# Patient Record
Sex: Male | Born: 1955 | Hispanic: No | Marital: Married | Smoking: Former smoker
Health system: Southern US, Community
[De-identification: ages and names within clinical notes are randomized; demographics above are authoritative.]

## PROBLEM LIST (undated history)

## (undated) DIAGNOSIS — IMO0002 Reserved for concepts with insufficient information to code with codable children: Secondary | ICD-10-CM

## (undated) DIAGNOSIS — I10 Essential (primary) hypertension: Secondary | ICD-10-CM

## (undated) DIAGNOSIS — E119 Type 2 diabetes mellitus without complications: Secondary | ICD-10-CM

## (undated) DIAGNOSIS — K5732 Diverticulitis of large intestine without perforation or abscess without bleeding: Secondary | ICD-10-CM

## (undated) DIAGNOSIS — C4490 Unspecified malignant neoplasm of skin, unspecified: Secondary | ICD-10-CM

## (undated) DIAGNOSIS — D3500 Benign neoplasm of unspecified adrenal gland: Secondary | ICD-10-CM

## (undated) DIAGNOSIS — R1909 Other intra-abdominal and pelvic swelling, mass and lump: Secondary | ICD-10-CM

## (undated) HISTORY — DX: Unspecified malignant neoplasm of skin, unspecified: C44.90

## (undated) HISTORY — PX: OTHER SURGICAL HISTORY: SHX169

---

## 2003-10-20 ENCOUNTER — Ambulatory Visit (HOSPITAL_BASED_OUTPATIENT_CLINIC_OR_DEPARTMENT_OTHER): Admission: RE | Admit: 2003-10-20 | Discharge: 2003-10-20 | Payer: Self-pay | Admitting: Urology

## 2010-12-22 ENCOUNTER — Emergency Department (HOSPITAL_COMMUNITY)
Admission: EM | Admit: 2010-12-22 | Discharge: 2010-12-23 | Disposition: A | Discharge status: Home / Self Care | Payer: 59 | Attending: Emergency Medicine | Admitting: Emergency Medicine

## 2010-12-22 DIAGNOSIS — E119 Type 2 diabetes mellitus without complications: Secondary | ICD-10-CM | POA: Insufficient documentation

## 2010-12-22 DIAGNOSIS — R040 Epistaxis: Secondary | ICD-10-CM | POA: Insufficient documentation

## 2010-12-22 DIAGNOSIS — I10 Essential (primary) hypertension: Secondary | ICD-10-CM | POA: Insufficient documentation

## 2010-12-22 DIAGNOSIS — Z79899 Other long term (current) drug therapy: Secondary | ICD-10-CM | POA: Insufficient documentation

## 2010-12-22 DIAGNOSIS — R110 Nausea: Secondary | ICD-10-CM | POA: Insufficient documentation

## 2010-12-23 LAB — BASIC METABOLIC PANEL
CO2: 28 mEq/L (ref 19–32)
Calcium: 9.7 mg/dL (ref 8.4–10.5)
Creatinine, Ser: 0.9 mg/dL (ref 0.4–1.5)
GFR calc Af Amer: >60 mL/min (ref >60)
GFR calc non Af Amer: >60 mL/min (ref >60)
Sodium: 139 mEq/L (ref 135–145)

## 2010-12-23 LAB — CBC
Hemoglobin: 12.4 g/dL — ABNORMAL LOW (ref 13.0–17.0)
MCH: 30 pg (ref 26.0–34.0)
MCHC: 33.2 g/dL (ref 30.0–36.0)
Platelets: 200 10*3/uL (ref 150–400)
RDW: 13.3 % (ref 11.5–15.5)

## 2010-12-23 LAB — PROTIME-INR
INR: 0.93 (ref 0.00–1.49)
Prothrombin Time: 12.7 seconds (ref 11.6–15.2)

## 2010-12-23 LAB — DIFFERENTIAL
Basophils Absolute: 0 10*3/uL (ref 0.0–0.1)
Basophils Relative: 0 % (ref 0–1)
Eosinophils Absolute: 0.1 10*3/uL (ref 0.0–0.7)
Eosinophils Relative: 1 % (ref 0–5)
Monocytes Absolute: 0.4 10*3/uL (ref 0.1–1.0)
Monocytes Relative: 7 % (ref 3–12)
Neutro Abs: 2.8 10*3/uL (ref 1.7–7.7)

## 2011-10-21 ENCOUNTER — Ambulatory Visit (INDEPENDENT_AMBULATORY_CARE_PROVIDER_SITE_OTHER): Payer: 59 | Admitting: Family Medicine

## 2011-10-21 VITALS — BP 135/85 | HR 101 | Temp 99.0°F | Resp 16 | Ht 71.5 in | Wt 250.2 lb

## 2011-10-21 DIAGNOSIS — Z0000 Encounter for general adult medical examination without abnormal findings: Secondary | ICD-10-CM

## 2011-10-21 DIAGNOSIS — E119 Type 2 diabetes mellitus without complications: Secondary | ICD-10-CM

## 2011-10-21 DIAGNOSIS — I10 Essential (primary) hypertension: Secondary | ICD-10-CM

## 2011-10-21 LAB — GLUCOSE, POCT (MANUAL RESULT ENTRY): POC Glucose: 154

## 2011-10-21 MED ORDER — METFORMIN HCL 1000 MG PO TABS: 1000.0000 mg | ORAL_TABLET | Freq: Two times a day (BID) | ORAL | Status: DC

## 2011-10-21 MED ORDER — LISINOPRIL 20 MG PO TABS: 20.0000 mg | ORAL_TABLET | Freq: Every day | ORAL | Status: DC

## 2011-10-21 NOTE — Progress Notes (Signed)
  Subjective:    Patient ID: Theodore Flores, male    DOB: 1955/08/20, 56 y.o.   MRN: [id number]  HPI 56 yo male here for PE and DOT.  Ran out of medicines about 2 or more months ago and decided not to refill because has made lifestyle changes and lost weight.  H/O diabetes and htn.  No complaints.  DOT expired last month, needs new card.     Review of Systems Negative except as per HPI     Objective:   Physical Exam  Constitutional: Vital signs are normal. He appears well-developed and well-nourished. He is active.  HENT:  Head: Normocephalic.  Right Ear: Hearing, tympanic membrane, external ear and ear canal normal.  Left Ear: Hearing, tympanic membrane, external ear and ear canal normal.  Nose: Nose normal.  Mouth/Throat: Uvula is midline, oropharynx is clear and moist and mucous membranes are normal.  Eyes: Conjunctivae and EOM are normal. Pupils are equal, round, and reactive to light.  Neck: No mass and no thyromegaly present.  Cardiovascular: Normal rate, regular rhythm, normal heart sounds, intact distal pulses and normal pulses.   Pulmonary/Chest: Effort normal and breath sounds normal.  Abdominal: Soft. Normal appearance and bowel sounds are normal. There is no hepatosplenomegaly. There is no tenderness. There is no CVA tenderness. No hernia. Hernia confirmed negative in the right inguinal area and confirmed negative in the left inguinal area.  Genitourinary: Testes normal and penis normal.  Lymphadenopathy:    He has no cervical adenopathy.    He has no axillary adenopathy.  Neurological: He is alert.    Results for orders placed in visit on 10/21/11  GLUCOSE, POCT (MANUAL RESULT ENTRY)      Component Value Range   POC Glucose 154    POCT GLYCOSYLATED HEMOGLOBIN (HGB A1C)      Component Value Range   Hemoglobin A1C 8.0           Assessment & Plan:  PE, DOT - labs pending.  Good for 1 year. HTN - continue lisinopril DM - continue metformin.

## 2011-10-22 LAB — COMPREHENSIVE METABOLIC PANEL
ALT: 22 U/L (ref 0–53)
Albumin: 4.2 g/dL (ref 3.5–5.2)
Alkaline Phosphatase: 64 U/L (ref 39–117)
CO2: 22 mEq/L (ref 19–32)
Potassium: 3.8 mEq/L (ref 3.5–5.3)
Sodium: 141 mEq/L (ref 135–145)
Total Bilirubin: 0.3 mg/dL (ref 0.3–1.2)
Total Protein: 7 g/dL (ref 6.0–8.3)

## 2011-10-22 LAB — CBC WITH DIFFERENTIAL/PLATELET
Eosinophils Absolute: 0 10*3/uL (ref 0.0–0.7)
Hemoglobin: 13.8 g/dL (ref 13.0–17.0)
Lymphs Abs: 2.7 10*3/uL (ref 0.7–4.0)
MCH: 29.8 pg (ref 26.0–34.0)
Monocytes Relative: 4 % (ref 3–12)
Neutro Abs: 2.8 10*3/uL (ref 1.7–7.7)
Neutrophils Relative %: 48 % (ref 43–77)
Platelets: 246 10*3/uL (ref 150–400)
RBC: 4.63 MIL/uL (ref 4.22–5.81)
WBC: 5.7 10*3/uL (ref 4.0–10.5)

## 2012-07-16 ENCOUNTER — Inpatient Hospital Stay (HOSPITAL_COMMUNITY)
Admission: EM | Admit: 2012-07-16 | Discharge: 2012-07-20 | DRG: 730 | Disposition: A | Discharge status: Home / Self Care | Payer: 59 | Attending: Family Medicine | Admitting: Family Medicine

## 2012-07-16 ENCOUNTER — Encounter (HOSPITAL_COMMUNITY): Payer: Self-pay | Admitting: *Deleted

## 2012-07-16 ENCOUNTER — Emergency Department (HOSPITAL_COMMUNITY)
Admission: EM | Admit: 2012-07-16 | Discharge: 2012-07-16 | Disposition: A | Discharge status: Nursing Home (Includes ICF) | Payer: 59 | Source: Home / Self Care | Attending: Family Medicine | Admitting: Family Medicine

## 2012-07-16 ENCOUNTER — Emergency Department (HOSPITAL_COMMUNITY): Payer: 59

## 2012-07-16 DIAGNOSIS — I10 Essential (primary) hypertension: Secondary | ICD-10-CM | POA: Diagnosis present

## 2012-07-16 DIAGNOSIS — R1909 Other intra-abdominal and pelvic swelling, mass and lump: Secondary | ICD-10-CM | POA: Diagnosis present

## 2012-07-16 DIAGNOSIS — Z823 Family history of stroke: Secondary | ICD-10-CM

## 2012-07-16 DIAGNOSIS — N492 Inflammatory disorders of scrotum: Secondary | ICD-10-CM

## 2012-07-16 DIAGNOSIS — Z8249 Family history of ischemic heart disease and other diseases of the circulatory system: Secondary | ICD-10-CM

## 2012-07-16 DIAGNOSIS — F17200 Nicotine dependence, unspecified, uncomplicated: Secondary | ICD-10-CM | POA: Diagnosis present

## 2012-07-16 DIAGNOSIS — R1900 Intra-abdominal and pelvic swelling, mass and lump, unspecified site: Secondary | ICD-10-CM

## 2012-07-16 DIAGNOSIS — N508 Other specified disorders of male genital organs: Principal | ICD-10-CM | POA: Diagnosis present

## 2012-07-16 DIAGNOSIS — F1010 Alcohol abuse, uncomplicated: Secondary | ICD-10-CM | POA: Diagnosis present

## 2012-07-16 DIAGNOSIS — N498 Inflammatory disorders of other specified male genital organs: Secondary | ICD-10-CM

## 2012-07-16 DIAGNOSIS — N5089 Other specified disorders of the male genital organs: Secondary | ICD-10-CM

## 2012-07-16 DIAGNOSIS — E1169 Type 2 diabetes mellitus with other specified complication: Secondary | ICD-10-CM | POA: Diagnosis present

## 2012-07-16 DIAGNOSIS — E119 Type 2 diabetes mellitus without complications: Secondary | ICD-10-CM | POA: Diagnosis present

## 2012-07-16 DIAGNOSIS — Z809 Family history of malignant neoplasm, unspecified: Secondary | ICD-10-CM

## 2012-07-16 DIAGNOSIS — E669 Obesity, unspecified: Secondary | ICD-10-CM | POA: Diagnosis present

## 2012-07-16 HISTORY — DX: Essential (primary) hypertension: I10

## 2012-07-16 HISTORY — DX: Type 2 diabetes mellitus without complications: E11.9

## 2012-07-16 LAB — URINE MICROSCOPIC-ADD ON

## 2012-07-16 LAB — CBC WITH DIFFERENTIAL/PLATELET
Basophils Absolute: 0 10*3/uL (ref 0.0–0.1)
Basophils Relative: 0 % (ref 0–1)
MCHC: 33.9 g/dL (ref 30.0–36.0)
Monocytes Absolute: 0.4 10*3/uL (ref 0.1–1.0)
Neutro Abs: 3.2 10*3/uL (ref 1.7–7.7)
Neutrophils Relative %: 51 % (ref 43–77)
Platelets: 240 10*3/uL (ref 150–400)
RDW: 12.7 % (ref 11.5–15.5)

## 2012-07-16 LAB — URINALYSIS, ROUTINE W REFLEX MICROSCOPIC
Ketones, ur: NEGATIVE mg/dL
Leukocytes, UA: NEGATIVE
Nitrite: NEGATIVE
Protein, ur: NEGATIVE mg/dL
Urobilinogen, UA: 0.2 mg/dL (ref 0.0–1.0)

## 2012-07-16 LAB — COMPREHENSIVE METABOLIC PANEL
ALT: 18 U/L (ref 0–53)
AST: 16 U/L (ref 0–37)
Albumin: 3.3 g/dL — ABNORMAL LOW (ref 3.5–5.2)
Chloride: 103 mEq/L (ref 96–112)
Creatinine, Ser: 0.91 mg/dL (ref 0.50–1.35)
Potassium: 3.8 mEq/L (ref 3.5–5.1)
Sodium: 137 mEq/L (ref 135–145)
Total Bilirubin: 0.2 mg/dL — ABNORMAL LOW (ref 0.3–1.2)

## 2012-07-16 MED ORDER — MORPHINE SULFATE 4 MG/ML IJ SOLN
4.0000 mg | Freq: Once | INTRAMUSCULAR | Status: AC
  Administered 2012-07-16: 4 mg via INTRAVENOUS
  Filled 2012-07-16: qty 1

## 2012-07-16 MED ORDER — PIPERACILLIN-TAZOBACTAM 3.375 G IVPB 30 MIN
3.3750 g | Freq: Once | INTRAVENOUS | Status: AC
  Administered 2012-07-16: 3.375 g via INTRAVENOUS
  Filled 2012-07-16: qty 50

## 2012-07-16 MED ORDER — IOHEXOL 300 MG/ML  SOLN
100.0000 mL | Freq: Once | INTRAMUSCULAR | Status: AC | PRN
  Administered 2012-07-16: 100 mL via INTRAVENOUS

## 2012-07-16 MED ORDER — ONDANSETRON HCL 4 MG/2ML IJ SOLN
4.0000 mg | Freq: Once | INTRAMUSCULAR | Status: AC
  Administered 2012-07-16: 4 mg via INTRAVENOUS
  Filled 2012-07-16: qty 2

## 2012-07-16 MED ORDER — VANCOMYCIN HCL IN DEXTROSE 1-5 GM/200ML-% IV SOLN
1000.0000 mg | Freq: Once | INTRAVENOUS | Status: AC
  Administered 2012-07-17: 1000 mg via INTRAVENOUS
  Filled 2012-07-16: qty 200

## 2012-07-16 NOTE — ED Notes (Signed)
Pt  Has  A  Large   Draining  Necrotic  Area  To  r  Side  Of  His  Scrotum  Which   He  Reports  Started  Off  As  A  Wart  Which  Was  Removed   3  Years  Ago         He  Reports  The  Area        Has  Gotten  Worse  Over the  Last  Month         It is  Foul  Smelling  And  Purulent         He  Is  A  Diabetic        Who  Takes  Metformin

## 2012-07-16 NOTE — ED Notes (Signed)
The pt  Has had pain and drainage on his scrotum for one month with drainage also.  He had a wart removed 2-3 years ago.

## 2012-07-16 NOTE — ED Provider Notes (Addendum)
History     CSN: [id number]  Arrival date & time 07/16/12  1709   First MD Initiated Contact with Patient 07/16/12 2027      Chief Complaint  Patient presents with  . scrotum pain     (Consider location/radiation/quality/duration/timing/severity/associated sxs/prior treatment) HPI Comments: 56 year old man with a history of diabetes and hypertension presents emergency department with chief complaint of scrotal pain.  Onset of symptoms began about 2-3 months ago but has been gradually worsening over the last month.  Patient reports he had a wart removed in the past and that since the removal the area has worsened.  Now there is a foul smell and purulent drainage.  Pain severity 10/10 without radiation.  Patient denies fever, night sweats, chills, abdominal pain, nausea, vomiting.  The history is provided by the patient.    Past Medical History  Diagnosis Date  . Diabetes mellitus without complication   . Hypertension     History reviewed. No pertinent past surgical history.  No family history on file.  History  Substance Use Topics  . Smoking status: Current Every Day Smoker    Types: Cigarettes  . Smokeless tobacco: Not on file  . Alcohol Use: Yes      Review of Systems  Constitutional: Negative for fever, chills and appetite change.  HENT: Negative for congestion.   Eyes: Negative for visual disturbance.  Respiratory: Negative for shortness of breath.   Cardiovascular: Negative for chest pain and leg swelling.  Gastrointestinal: Negative for abdominal pain.  Genitourinary: Positive for genital sores. Negative for dysuria, urgency and frequency.  Neurological: Negative for dizziness, syncope, weakness, light-headedness, numbness and headaches.  Psychiatric/Behavioral: Negative for confusion.  All other systems reviewed and are negative.    Allergies  Review of patient's allergies indicates no known allergies.  Home Medications   Current Outpatient Rx  Name   Route  Sig  Dispense  Refill  . LISINOPRIL 20 MG PO TABS   Oral   Take 1 tablet (20 mg total) by mouth daily.   30 tablet   11   . METFORMIN HCL 1000 MG PO TABS   Oral   Take 1 tablet (1,000 mg total) by mouth 2 (two) times daily with a meal.   60 tablet   11     BP 151/90  Pulse 100  Temp 98.1 F (36.7 C) (Oral)  Resp 18  SpO2 98%  Physical Exam  Nursing note and vitals reviewed. Constitutional: He is oriented to person, place, and time. He appears well-developed and well-nourished. No distress.  HENT:  Head: Normocephalic and atraumatic.  Eyes: Conjunctivae normal and EOM are normal.  Neck: Normal range of motion.  Pulmonary/Chest: Effort normal.  Genitourinary:       Exam chaperoned. Large 5 cm ulcerated lesion on right lateral testicle. Foul smelling. Green/Yellow purulent drainage.   Musculoskeletal: Normal range of motion.  Neurological: He is alert and oriented to person, place, and time.  Skin: Skin is warm and dry. No rash noted. He is not diaphoretic.  Psychiatric: He has a normal mood and affect. His behavior is normal.    ED Course  Procedures (including critical care time)  Labs Reviewed  COMPREHENSIVE METABOLIC PANEL - Abnormal; Notable for the following:    Glucose, Bld 226 (*)     Albumin 3.3 (*)     Total Bilirubin 0.2 (*)     All other components within normal limits  CBC WITH DIFFERENTIAL  WOUND CULTURE  URINALYSIS, ROUTINE  W REFLEX MICROSCOPIC  URINE CULTURE   Ct Pelvis W Contrast  07/16/2012  *RADIOLOGY REPORT*  Clinical Data:  Large draining necrotic area at the right side of the scrotum.  History of diabetes.  CT PELVIS WITH CONTRAST  Technique:  Multidetector CT imaging of the pelvis was performed using the standard protocol following the bolus administration of intravenous contrast.  Contrast: OMNIPAQUE IOHEXOL 300 MG/ML  SOLN  Comparison:   None.  Findings:  There is soft tissue disruption along the anterior aspect of the right  scrotum, with associated mild bilateral scrotal wall edema, mildly more prominent on the right.  The testes are otherwise grossly unremarkable.  No significant hydroceles are seen.  No free air is seen to suggest Fournier's gangrene.  A small right inguinal hernia is noted, containing only fat. Scattered inguinal nodes remain normal in size.  No definite pelvic sidewall lymphadenopathy is seen.  Scattered calcifications are seen adjacent to the right-sided spermatic cord.  The bladder is decompressed and grossly unremarkable.  The prostate remains normal in size, with scattered calcification.  Visualized small large bowel loops are grossly unremarkable.  Mild scattered calcification is noted along the abdominal aorta and its branches.  No acute osseous abnormalities are identified.  IMPRESSION:  1.  Soft tissue disruption along the anterior aspect of the right scrotum, with associated mild bilateral scrotal wall edema, mildly more prominent on the right.  No significant hydrocele seen; no free air seen to suggest Fournier's gangrene.  No evidence of focal abscess. 2.  Small right inguinal hernia, containing only fat. 3.  Mild scattered vascular calcifications noted.   Original Report Authenticated By: Tonia Ghent, M.D.      1. Ulcer of testis       MDM  Ulceration/ wound of testicle  Pt is a 56 yo dm with ulceration of right lateral testicle. Wound culture sent, pain managed in ER, & IV abx started. Pt to be admitted for further evaluation. Pt seen w Dr. Ignacia Palma who is agreeable w plan. The patient appears reasonably stabilized for admission considering the current resources, flow, and capabilities available in the ED at this time, and I doubt any other [facility] requiring further screening and/or treatment in the ED prior to admission.         Jaci Carrel, PA-C 07/17/12 0053  Jaci Carrel, PA-C 08/05/12 1530

## 2012-07-16 NOTE — Progress Notes (Signed)
56 yo man with 3+ month Hx of a nonhealing lesion on the right scrotal skin, that has become much larger over the pase two weeks, and no has a foul-smelling discharge.  Exam reveals a 3 x 3 cm ulcerated area on the right hemiscrotum, with a foul-smelling discharge.  Recommend lab workup, IV antibiotics, and admission.  May need skin biopsy to check for malignancy.

## 2012-07-16 NOTE — ED Notes (Signed)
Pt to xray

## 2012-07-16 NOTE — ED Notes (Signed)
Iv placed. The pts wound has a very foul odor

## 2012-07-16 NOTE — ED Provider Notes (Signed)
History     CSN: [id number]  Arrival date & time 07/16/12  1336   First MD Initiated Contact with Patient 07/16/12 1630      Chief Complaint  Patient presents with  . Abscess    (Consider location/radiation/quality/duration/timing/severity/associated sxs/prior treatment) Patient is a 56 y.o. male presenting with abscess. The history is provided by the patient.  Abscess  This is a chronic problem. Episode onset: for 3 mos, but worse past 1 month. The problem has been gradually worsening. The abscess is present on the genitalia. The problem is moderate. The abscess is characterized by draining, redness and swelling.    No past medical history on file.  No past surgical history on file.  No family history on file.  History  Substance Use Topics  . Smoking status: Current Every Day Smoker    Types: Cigarettes  . Smokeless tobacco: Not on file  . Alcohol Use: Not on file      Review of Systems  Constitutional: Negative.   Genitourinary: Positive for scrotal swelling. Negative for testicular pain.    Allergies  Review of patient's allergies indicates no known allergies.  Home Medications   Current Outpatient Rx  Name  Route  Sig  Dispense  Refill  . LISINOPRIL 20 MG PO TABS   Oral   Take 1 tablet (20 mg total) by mouth daily.   30 tablet   11   . METFORMIN HCL 1000 MG PO TABS   Oral   Take 1 tablet (1,000 mg total) by mouth 2 (two) times daily with a meal.   60 tablet   11   . VITAMIN D (ERGOCALCIFEROL) 50000 UNITS PO CAPS   Oral   Take 50,000 Units by mouth once a week.           BP 154/81  Pulse 107  Temp 98.1 F (36.7 C) (Oral)  Resp 18  SpO2 98%  Physical Exam  Nursing note and vitals reviewed. Constitutional: He is oriented to person, place, and time. He appears well-developed and well-nourished.  Abdominal: Bowel sounds are normal.  Genitourinary:       Warty lesion approx 5cm with purulent foul smelling drainage assoc. On right side of  scrotum,  Neurological: He is alert and oriented to person, place, and time.    ED Course  Procedures (including critical care time)  Labs Reviewed - No data to display No results found.   1. Scrotal infection       MDM          Linna Hoff, MD 07/19/12 [phone]

## 2012-07-16 NOTE — ED Notes (Signed)
The pt is alert.  i triaged this pt earlier with a scrotal lesion that keeps draining for one month .  The pt is a diabetic

## 2012-07-16 NOTE — ED Notes (Signed)
Admitting doctors  At the bedside

## 2012-07-17 ENCOUNTER — Inpatient Hospital Stay (HOSPITAL_COMMUNITY): Payer: 59

## 2012-07-17 LAB — GLUCOSE, CAPILLARY
Glucose-Capillary: 228 mg/dL — ABNORMAL HIGH (ref 70–99)
Glucose-Capillary: 127 mg/dL — ABNORMAL HIGH (ref 70–99)
Glucose-Capillary: 176 mg/dL — ABNORMAL HIGH (ref 70–99)
Glucose-Capillary: 169 mg/dL — ABNORMAL HIGH (ref 70–99)

## 2012-07-17 LAB — CBC
MCHC: 32.9 g/dL (ref 30.0–36.0)
RDW: 12.8 % (ref 11.5–15.5)

## 2012-07-17 LAB — CREATININE, SERUM
Creatinine, Ser: 0.97 mg/dL (ref 0.50–1.35)
GFR calc non Af Amer: 90 mL/min — ABNORMAL LOW (ref >90)

## 2012-07-17 MED ORDER — LORAZEPAM 1 MG PO TABS: 1.0000 mg | ORAL_TABLET | Freq: Four times a day (QID) | ORAL | Status: AC | PRN

## 2012-07-17 MED ORDER — SODIUM CHLORIDE 0.9 % IJ SOLN: 3.0000 mL | INTRAMUSCULAR | Status: DC | PRN

## 2012-07-17 MED ORDER — FOLIC ACID 1 MG PO TABS
1.0000 mg | ORAL_TABLET | Freq: Every day | ORAL | Status: DC
  Administered 2012-07-17 – 2012-07-20 (×4): 1 mg via ORAL
  Filled 2012-07-17 (×4): qty 1

## 2012-07-17 MED ORDER — INSULIN ASPART 100 UNIT/ML ~~LOC~~ SOLN
0.0000 [IU] | Freq: Three times a day (TID) | SUBCUTANEOUS | Status: DC
  Administered 2012-07-17: 2 [IU] via SUBCUTANEOUS
  Administered 2012-07-17: 1 [IU] via SUBCUTANEOUS
  Administered 2012-07-17: 2 [IU] via SUBCUTANEOUS
  Administered 2012-07-18: 18:00:00 via SUBCUTANEOUS
  Administered 2012-07-18: 2 [IU] via SUBCUTANEOUS
  Administered 2012-07-19 – 2012-07-20 (×4): 1 [IU] via SUBCUTANEOUS

## 2012-07-17 MED ORDER — HEPARIN SODIUM (PORCINE) 5000 UNIT/ML IJ SOLN
5000.0000 [IU] | Freq: Three times a day (TID) | INTRAMUSCULAR | Status: DC
  Administered 2012-07-17 – 2012-07-20 (×8): 5000 [IU] via SUBCUTANEOUS
  Filled 2012-07-17 (×12): qty 1

## 2012-07-17 MED ORDER — LORAZEPAM 2 MG/ML IJ SOLN
1.0000 mg | Freq: Four times a day (QID) | INTRAMUSCULAR | Status: AC | PRN
  Administered 2012-07-18: 1 mg via INTRAVENOUS
  Filled 2012-07-17: qty 1

## 2012-07-17 MED ORDER — VANCOMYCIN HCL IN DEXTROSE 1-5 GM/200ML-% IV SOLN
1000.0000 mg | Freq: Three times a day (TID) | INTRAVENOUS | Status: DC
  Administered 2012-07-17 – 2012-07-18 (×4): 1000 mg via INTRAVENOUS
  Filled 2012-07-17 (×6): qty 200

## 2012-07-17 MED ORDER — THIAMINE HCL 100 MG/ML IJ SOLN
100.0000 mg | Freq: Every day | INTRAMUSCULAR | Status: DC
  Filled 2012-07-17 (×4): qty 1

## 2012-07-17 MED ORDER — VITAMIN B-1 100 MG PO TABS
100.0000 mg | ORAL_TABLET | Freq: Every day | ORAL | Status: DC
  Administered 2012-07-17 – 2012-07-20 (×4): 100 mg via ORAL
  Filled 2012-07-17 (×4): qty 1

## 2012-07-17 MED ORDER — MORPHINE SULFATE 2 MG/ML IJ SOLN
2.0000 mg | INTRAMUSCULAR | Status: DC | PRN
  Administered 2012-07-18 – 2012-07-19 (×6): 2 mg via INTRAVENOUS
  Filled 2012-07-17 (×6): qty 1

## 2012-07-17 MED ORDER — PIPERACILLIN-TAZOBACTAM 3.375 G IVPB
3.3750 g | Freq: Three times a day (TID) | INTRAVENOUS | Status: DC
  Administered 2012-07-17 – 2012-07-19 (×7): 3.375 g via INTRAVENOUS
  Filled 2012-07-17 (×10): qty 50

## 2012-07-17 MED ORDER — LISINOPRIL 20 MG PO TABS
20.0000 mg | ORAL_TABLET | Freq: Every day | ORAL | Status: DC
  Administered 2012-07-17 – 2012-07-20 (×4): 20 mg via ORAL
  Filled 2012-07-17 (×4): qty 1

## 2012-07-17 MED ORDER — ADULT MULTIVITAMIN W/MINERALS CH
1.0000 | ORAL_TABLET | Freq: Every day | ORAL | Status: DC
  Administered 2012-07-17 – 2012-07-20 (×4): 1 via ORAL
  Filled 2012-07-17 (×4): qty 1

## 2012-07-17 MED ORDER — ACETAMINOPHEN 650 MG RE SUPP: 650.0000 mg | Freq: Four times a day (QID) | RECTAL | Status: DC | PRN

## 2012-07-17 MED ORDER — ACETAMINOPHEN 325 MG PO TABS
650.0000 mg | ORAL_TABLET | Freq: Four times a day (QID) | ORAL | Status: DC | PRN
  Administered 2012-07-18 – 2012-07-19 (×2): 650 mg via ORAL
  Filled 2012-07-17 (×2): qty 2

## 2012-07-17 MED ORDER — SODIUM CHLORIDE 0.9 % IV SOLN: 250.0000 mL | INTRAVENOUS | Status: DC | PRN

## 2012-07-17 MED ORDER — SODIUM CHLORIDE 0.9 % IJ SOLN
3.0000 mL | Freq: Two times a day (BID) | INTRAMUSCULAR | Status: DC
  Administered 2012-07-17 – 2012-07-19 (×3): 3 mL via INTRAVENOUS
  Administered 2012-07-19: 10 mL via INTRAVENOUS
  Administered 2012-07-20: 3 mL via INTRAVENOUS

## 2012-07-17 NOTE — Consult Note (Signed)
Reason for Consult:Rt Groin Mass Referring Physician: Doralee Albino MD  Theodore Flores is an 56 y.o. male.  HPI:   1 - Rt Groin Mass- pt with h/o "wart" removed from rt groin several years ago that recurred and has steadily been enlarging for at least 3 years. 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  Pt denies HIV, STD. Denies hematuria, flank pain.  No prior Urologic evaluation.     Past Medical History  Diagnosis Date  . Diabetes mellitus without complication   . Hypertension     Past Surgical History  Procedure Date  . Wart removal     Scrotal wart removal 10+ years ago    Family History  Problem Relation Age of Onset  . Diabetes Mellitus II    . CVA    . Hypertension      Social History:  reports that he has been smoking Cigarettes.  He has a 20 pack-year smoking history. He does not have any smokeless tobacco history on file. He reports that he drinks about 5 ounces of alcohol per week. He reports that he does not use illicit drugs.  Allergies: No Known Allergies  Medications: I have reviewed the patient's current medications.  Results for orders placed during the hospital encounter of 07/16/12 (from the past 48 hour(s))  CBC WITH DIFFERENTIAL     Status: Normal   Collection Time   07/16/12  9:58 PM      Component Value Range Comment   WBC 6.3  4.0 - 10.5 K/uL    RBC 4.46  4.22 - 5.81 MIL/uL    Hemoglobin 13.4  13.0 - 17.0 g/dL    HCT 16.1  09.6 - 04.5 %    MCV 88.6  78.0 - 100.0 fL    MCH 30.0  26.0 - 34.0 pg    MCHC 33.9  30.0 - 36.0 g/dL    RDW 40.9  81.1 - 91.4 %    Platelets 240  150 - 400 K/uL    Neutrophils Relative 51  43 - 77 %    Neutro Abs 3.2  1.7 - 7.7 K/uL    Lymphocytes Relative 41  12 - 46 %    Lymphs Abs  2.6  0.7 - 4.0 K/uL    Monocytes Relative 7  3 - 12 %    Monocytes Absolute 0.4  0.1 - 1.0 K/uL    Eosinophils Relative 1  0 - 5 %    Eosinophils Absolute 0.1  0.0 - 0.7 K/uL    Basophils Relative 0  0 - 1 %    Basophils Absolute 0.0  0.0 - 0.1 K/uL   COMPREHENSIVE METABOLIC PANEL     Status: Abnormal   Collection Time   07/16/12  9:58 PM      Component Value Range Comment   Sodium 137  135 - 145 mEq/L    Potassium 3.8  3.5 - 5.1 mEq/L    Chloride 103  96 - 112 mEq/L    CO2 24  19 - 32 mEq/L    Glucose, Bld 226 (*) 70 - 99 mg/dL    BUN 12  6 - 23 mg/dL    Creatinine, Ser 7.82  0.50 - 1.35 mg/dL  Calcium 9.2  8.4 - 10.5 mg/dL    Total Protein 6.8  6.0 - 8.3 g/dL    Albumin 3.3 (*) 3.5 - 5.2 g/dL    AST 16  0 - 37 U/L    ALT 18  0 - 53 U/L    Alkaline Phosphatase 85  39 - 117 U/L    Total Bilirubin 0.2 (*) 0.3 - 1.2 mg/dL    GFR calc non Af Amer >90  >90 mL/min    GFR calc Af Amer >90  >90 mL/min   URINALYSIS, ROUTINE W REFLEX MICROSCOPIC     Status: Abnormal   Collection Time   07/16/12 10:17 PM      Component Value Range Comment   Color, Urine YELLOW  YELLOW    APPearance CLOUDY (*) CLEAR    Specific Gravity, Urine 1.030  1.005 - 1.030    pH 5.0  5.0 - 8.0    Glucose, UA >1000 (*) NEGATIVE mg/dL    Hgb urine dipstick TRACE (*) NEGATIVE    Bilirubin Urine NEGATIVE  NEGATIVE    Ketones, ur NEGATIVE  NEGATIVE mg/dL    Protein, ur NEGATIVE  NEGATIVE mg/dL    Urobilinogen, UA 0.2  0.0 - 1.0 mg/dL    Nitrite NEGATIVE  NEGATIVE    Leukocytes, UA NEGATIVE  NEGATIVE   URINE MICROSCOPIC-ADD ON     Status: Abnormal   Collection Time   07/16/12 10:17 PM      Component Value Range Comment   Squamous Epithelial / LPF RARE  RARE    WBC, UA 0-2  <3 WBC/hpf    Bacteria, UA RARE  RARE    Crystals CA OXALATE CRYSTALS (*) NEGATIVE   WOUND CULTURE     Status: Normal (Preliminary result)   Collection Time   07/17/12 12:00 AM      Component Value Range Comment   Specimen Description  WOUND SCROTUM      Special Requests Normal      Gram Stain        Value: NO WBC SEEN     NO SQUAMOUS EPITHELIAL CELLS SEEN     ABUNDANT GRAM NEGATIVE RODS     ABUNDANT GRAM POSITIVE COCCI IN PAIRS   Culture PENDING      Report Status PENDING     GLUCOSE, CAPILLARY     Status: Abnormal   Collection Time   07/17/12  2:41 AM      Component Value Range Comment   Glucose-Capillary 228 (*) 70 - 99 mg/dL    Comment 1 Notify RN     CBC     Status: Abnormal   Collection Time   07/17/12  5:15 AM      Component Value Range Comment   WBC 5.9  4.0 - 10.5 K/uL    RBC 4.31  4.22 - 5.81 MIL/uL    Hemoglobin 12.7 (*) 13.0 - 17.0 g/dL    HCT 16.1 (*) 09.6 - 52.0 %    MCV 89.6  78.0 - 100.0 fL    MCH 29.5  26.0 - 34.0 pg    MCHC 32.9  30.0 - 36.0 g/dL    RDW 04.5  40.9 - 81.1 %    Platelets 216  150 - 400 K/uL   CREATININE, SERUM     Status: Abnormal   Collection Time   07/17/12  5:15 AM      Component Value Range Comment   Creatinine, Ser 0.97  0.50 - 1.35 mg/dL  GFR calc non Af Amer 90 (*) >90 mL/min    GFR calc Af Amer >90  >90 mL/min   GLUCOSE, CAPILLARY     Status: Abnormal   Collection Time   07/17/12  7:38 AM      Component Value Range Comment   Glucose-Capillary 161 (*) 70 - 99 mg/dL   GLUCOSE, CAPILLARY     Status: Abnormal   Collection Time   07/17/12 11:37 AM      Component Value Range Comment   Glucose-Capillary 127 (*) 70 - 99 mg/dL     Ct Pelvis W Contrast  07/16/2012  *RADIOLOGY REPORT*  Clinical Data:  Large draining necrotic area at the right side of the scrotum.  History of diabetes.  CT PELVIS WITH CONTRAST  Technique:  Multidetector CT imaging of the pelvis was performed using the standard protocol following the bolus administration of intravenous contrast.  Contrast: OMNIPAQUE IOHEXOL 300 MG/ML  SOLN  Comparison:   None.  Findings:  There is soft tissue disruption along the anterior aspect of the right scrotum, with associated mild bilateral scrotal wall  edema, mildly more prominent on the right.  The testes are otherwise grossly unremarkable.  No significant hydroceles are seen.  No free air is seen to suggest Fournier's gangrene.  A small right inguinal hernia is noted, containing only fat. Scattered inguinal nodes remain normal in size.  No definite pelvic sidewall lymphadenopathy is seen.  Scattered calcifications are seen adjacent to the right-sided spermatic cord.  The bladder is decompressed and grossly unremarkable.  The prostate remains normal in size, with scattered calcification.  Visualized small large bowel loops are grossly unremarkable.  Mild scattered calcification is noted along the abdominal aorta and its branches.  No acute osseous abnormalities are identified.  IMPRESSION:  1.  Soft tissue disruption along the anterior aspect of the right scrotum, with associated mild bilateral scrotal wall edema, mildly more prominent on the right.  No significant hydrocele seen; no free air seen to suggest Fournier's gangrene.  No evidence of focal abscess. 2.  Small right inguinal hernia, containing only fat. 3.  Mild scattered vascular calcifications noted.   Original Report Authenticated By: Tonia Ghent, M.D.     Review of Systems  Constitutional: Negative.  Negative for fever, chills and weight loss.  HENT: Negative.   Eyes: Negative.   Respiratory: Negative.   Cardiovascular: Negative.   Gastrointestinal: Negative.   Genitourinary: Negative.  Negative for dysuria, urgency, hematuria and flank pain.  Musculoskeletal: Negative.   Skin: Negative.        Skil lesion in Rt groin becoming more painful  Neurological: Negative.   Endo/Heme/Allergies: Negative.   Psychiatric/Behavioral: Negative.    Blood pressure 160/96, pulse 76, temperature 98.6 F (37 C), temperature source Oral, resp. rate 19, height 6' (1.829 m), weight 109.317 kg (241 lb), SpO2 99.00%. Physical Exam  Constitutional: He is oriented to person, place, and time. He appears  well-developed and well-nourished.  HENT:  Head: Normocephalic and atraumatic.  Eyes: EOM are normal. Pupils are equal, round, and reactive to light.  Neck: Normal range of motion. Neck supple.  Cardiovascular: Normal rate and regular rhythm.   Respiratory: Effort normal and breath sounds normal.  GI: Soft. Bowel sounds are normal.  Genitourinary: Prostate normal and penis normal.       Rt groin mass approx 5cm diameter with rolled edges and ulcerated base. No palpable involvment with penis or testicle. No palpable groin adenopathy. No expressible pus or  fluctuence.   Musculoskeletal: Normal range of motion.  Neurological: He is alert and oriented to person, place, and time.  Skin: Skin is warm and dry.  Psychiatric: He has a normal mood and affect. His behavior is normal. Judgment and thought content normal.    Assessment/Plan: 1 - Rt Groin Mass-  Exam and history highly concerning for neoplasm. Explained to pt need for local excision as first step in diagnostics and theraputics. Informed he may need additional therapy such as more surgery, inguinal lymph node dissection or even chemotherapy depending on grade/stage.  Alternative DDX such as severe skin infection or condyloma also discussed.  Will plan for local excision with possible scrotal exploration tomorrow AM. CXR prior to complete staging. CMP w/o elevated alk-phos. Risks including bleeding, infection, damage to adjacent structures (testicle, penis, groin vasculature and lymphatics) discussed as well as rare risks such as DVT, PE, CVA, MI, Mortalitity.  Surgery to be performed at 7:45AM at [facility], will need [facility] transport to and from.   Hanan Moen 07/17/2012, 12:28 PM

## 2012-07-17 NOTE — Progress Notes (Signed)
ANTIBIOTIC CONSULT NOTE - INITIAL  Pharmacy Consult for Vancomycin and Zosyn  Indication: possible scrotal infection  No Known Allergies  Patient Measurements: Height: 6' (182.9 cm) Weight: 241 lb (109.317 kg) IBW/kg (Calculated) : 77.6  Adjusted Body Weight: 90 kg  Vital Signs: Temp: 97.4 F (36.3 C) (11/30 0230) Temp src: Oral (11/30 0230) BP: 167/96 mmHg (11/30 0230) Pulse Rate: 84  (11/30 0230) Intake/Output from previous day:   Intake/Output from this shift:    Labs:  [facility] 07/16/12 2158  WBC 6.3  HGB 13.4  PLT 240  LABCREA --  CREATININE 0.91   Estimated Creatinine Clearance: 114.4 ml/min (by C-G formula based on Cr of 0.91). No results found for this basename: VANCOTROUGH:2,VANCOPEAK:2,VANCORANDOM:2,GENTTROUGH:2,GENTPEAK:2,GENTRANDOM:2,TOBRATROUGH:2,TOBRAPEAK:2,TOBRARND:2,AMIKACINPEAK:2,AMIKACINTROU:2,AMIKACIN:2, in the last 72 hours   Microbiology: No results found for this or any previous visit (from the past 720 hour(s)).  Medical History: Past Medical History  Diagnosis Date  . Diabetes mellitus without complication   . Hypertension     Medications:  Prescriptions prior to admission  Medication Sig Dispense Refill  . lisinopril (PRINIVIL,ZESTRIL) 20 MG tablet Take 1 tablet (20 mg total) by mouth daily.  30 tablet  11  . metFORMIN (GLUCOPHAGE) 1000 MG tablet Take 1 tablet (1,000 mg total) by mouth 2 (two) times daily with a meal.  60 tablet  11   Assessment: 56 yo male with possible scrotal infection for empiric antibiotics.  Vancomycin 1 g IV given in ED at 0030.  Goal of Therapy:  Vancomycin trough level 10-15 mcg/ml  Plan:  Vancomycin 1 g IV q8h Zosyn 3.375 g IV q8h   Kaily Wragg, Gary Fleet 07/17/2012,2:35 AM

## 2012-07-17 NOTE — ED Notes (Signed)
Report called to [id number] 

## 2012-07-17 NOTE — H&P (Signed)
Family Medicine Teaching [facility] Admission History and Physical Service Pager: [phone]  Patient name: Theodore Flores Medical record number: [id number] Date of birth: [birth year] Age: 56 y.o. Gender: male  Primary Care Provider: None  Chief Complaint: Right scrotal pain, swelling and odor  Assessment and Plan: Theodore Flores is a 56 y.o. year old male presenting with right scrotal infection vs malignancy 1. Scrotal lesion: I am somewhat doubtful that the patient's lesion developed over the course of 1 month.  It has an appearance that is concerning for malignancy.  Considering the lack of systemic response (as evidenced by minimal adenopathy, minimal surrounding tissue inflammation, and lack of white count) feel cancer is more likely although chancroid cannot be excluded. 1. Cont Vanc/Zosyn for now 2. F/u wound culture 3. Consult urology in AM 4. NPO after 2am for possible procedure 5. Check HIV/RPR 6. Morphine 2mg  q2hr prn pain 2. HTN: Currently somewhat above ideal blood pressure. 1. Cont home meds 2. Watch and consider titration if remains above goal 3. Diabetes: 1. Hold metformin in case of any contrast studies. 2. SSI 3. A1c with next blood draw 4. Alcohol use: Given daily intake, CIWA 5. FEN/GI: NPO after 2am 6. Prophylaxis: SQH 7. Disposition: Floor bed 8. Code Status: Full code  History of Present Illness: Theodore Flores is a 56 y.o. year old male presenting with right scrotal lesion and pain.  The patient reports that many years ago he had a wart surgically removed from the right lateral aspect of his scrotum. It has not bothered him until about a month ago. At that time this area began to get somewhat irritated. Over the past month he is reported that the area has gradually enlarged and become more painful. For about the last week has also had and odor associated with it. It is very tender to any type of touch. The patient denies any other associated symptoms. He  specifically denies any fevers, chills, pain with urination, or urethral discharge. He has noticed discharge from the lesion that he describes as yellowish. The patient presented to urgent care today as his wife found out about the lesion about 3 days ago and finally got him to come to the doctor today.  Patient Active Problem List  Diagnosis  . Hypertension  . Diabetes mellitus type 2 in obese  . Scrotal infection   Past Medical History: Past Medical History  Diagnosis Date  . Diabetes mellitus without complication   . Hypertension   No PCP Denies hospitalizations  Past Surgical History: Past Surgical History  Procedure Date  . Wart removal     Scrotal wart removal 10+ years ago   Social History: History  Substance Use Topics  . Smoking status: Current Every Day Smoker -- 0.5 packs/day for 40 years    Types: Cigarettes  . Smokeless tobacco: Not on file  . Alcohol Use: 5.0 oz/week    10 drink(s) per week     Comment: Social  Lives with wife, works in delivery/distribution. Denies drug use.  For any additional social history documentation, please refer to relevant sections of EMR.  Family History: Family History  Problem Relation Age of Onset  . Diabetes Mellitus II    . CVA    . Hypertension    Cancer  Allergies: No Known Allergies No current facility-administered medications on file prior to encounter.   Current Outpatient Prescriptions on File Prior to Encounter  Medication Sig Dispense Refill  . lisinopril (PRINIVIL,ZESTRIL) 20 MG tablet  Take 1 tablet (20 mg total) by mouth daily.  30 tablet  11  . metFORMIN (GLUCOPHAGE) 1000 MG tablet Take 1 tablet (1,000 mg total) by mouth 2 (two) times daily with a meal.  60 tablet  11   Review Of Systems: Per HPI with the following additions: 20 lb intentional weight loss over last 2 years, some chronic non-productive cough. Otherwise 12 point review of systems was performed and was unremarkable.  Physical Exam: BP  151/90  Pulse 100  Temp 98.1 F (36.7 C) (Oral)  Resp 18  SpO2 98% Exam: GEN: NAD, lying comfortably in bed HEENT: MMM, EOMI, sclera clear, poor dentition with no obvious infection CV: RRR, no murmurs, rubs, gallops PULM: CTAB, no wheezes or crackles, mild coarseness throughout, non-labored  VOZ:DGUYQ, obese, NABS EXTR: No edema, 2+ pulses NEURO: CN 2-12 intact SKIN: 5cm raised ulcerated lesion right inguinal region to lateral scrotum, exquisitely tender, draining, yellowed macerated appearance Lymph: Right inguinal nontender shotty lymphadenopathy.  No left inguinal adenopathy, no axillary or cervical adenopathy  Labs and Imaging:  CBC    Component Value Date/Time   WBC 6.3 07/16/2012 2158   RBC 4.46 07/16/2012 2158   HGB 13.4 07/16/2012 2158   HCT 39.5 07/16/2012 2158   PLT 240 07/16/2012 2158   MCV 88.6 07/16/2012 2158   MCH 30.0 07/16/2012 2158   MCHC 33.9 07/16/2012 2158   RDW 12.7 07/16/2012 2158   LYMPHSABS 2.6 07/16/2012 2158   MONOABS 0.4 07/16/2012 2158   EOSABS 0.1 07/16/2012 2158   BASOSABS 0.0 07/16/2012 2158   BMET    Component Value Date/Time   NA 137 07/16/2012 2158   K 3.8 07/16/2012 2158   CL 103 07/16/2012 2158   CO2 24 07/16/2012 2158   GLUCOSE 226* 07/16/2012 2158   BUN 12 07/16/2012 2158   CREATININE 0.91 07/16/2012 2158   CREATININE 1.03 10/21/2011 1944   CALCIUM 9.2 07/16/2012 2158   GFRNONAA >90 07/16/2012 2158   GFRAA >90 07/16/2012 2158   Urinalysis    Component Value Date/Time   COLORURINE YELLOW 07/16/2012 2217   APPEARANCEUR CLOUDY* 07/16/2012 2217   LABSPEC 1.030 07/16/2012 2217   PHURINE 5.0 07/16/2012 2217   GLUCOSEU >1000* 07/16/2012 2217   HGBUR TRACE* 07/16/2012 2217   BILIRUBINUR NEGATIVE 07/16/2012 2217   KETONESUR NEGATIVE 07/16/2012 2217   PROTEINUR NEGATIVE 07/16/2012 2217   UROBILINOGEN 0.2 07/16/2012 2217   NITRITE NEGATIVE 07/16/2012 2217   LEUKOCYTESUR NEGATIVE 07/16/2012 2217   CT pelvis with contrast: 1.  Soft tissue disruption along the anterior aspect of the right scrotum, with associated mild bilateral scrotal wall edema, mildly more prominent on the right. No significant hydrocele seen; no free air seen to suggest Fournier's gangrene. No evidence of focal abscess. 2. Small right inguinal hernia, containing only fat. 3. Mild scattered vascular calcifications noted.  Alwyn Cordner, MD 07/17/2012, 12:07 AM

## 2012-07-17 NOTE — ED Provider Notes (Addendum)
Medical screening examination/treatment/procedure(s) were conducted as a shared visit with non-physician practitioner(s) and myself.  I personally evaluated the patient during the encounter 56 yo man with 3+ month Hx of a nonhealing lesion on the right scrotal skin, that has become much larger over the pase two weeks, and no has a foul-smelling discharge. Exam reveals a 3 x 3 cm ulcerated area on the right hemiscrotum, with a foul-smelling discharge. Recommend lab workup, IV antibiotics, and admission. May need skin biopsy to check for malignancy.        Carleene Cooper III, MD 07/17/12 1017    Carleene Cooper III, MD 08/06/12 [phone]

## 2012-07-17 NOTE — Progress Notes (Signed)
Patient arrived from the ED via stretcher with Tech and his wife [place]. Patient is ambulatory with no assistance. He is alert and oriented X4. Besides draining scrotum skin is intact and dry. Vital signs were taken. All questions were answered. Patient fully assessed. No telemetry. Patient and wife were oriented to the unit, floor and the room. Admission is complete. Will continue to monitor patient.  Miakoda Mcmillion, RN

## 2012-07-17 NOTE — H&P (Signed)
FMTS Attending Admit Note Patient seen and examined by me, discussed with resident team and I agree with their plan.  Patient with 5cm diameter ulcerative scrotal lesion that is concerning for malignancy versus infection.  For continued abx treatment, urology consult and evaluation.  At the time of my note I see that Urology consult note with plan for surgical excision tomorrow morning at [facility].   Paula Compton, MD

## 2012-07-17 NOTE — ED Notes (Signed)
His wife brought him a sandwich

## 2012-07-18 ENCOUNTER — Encounter (HOSPITAL_COMMUNITY): Payer: Self-pay | Admitting: Anesthesiology

## 2012-07-18 ENCOUNTER — Inpatient Hospital Stay (HOSPITAL_COMMUNITY): Payer: 59

## 2012-07-18 ENCOUNTER — Encounter (HOSPITAL_COMMUNITY)
Admission: EM | Disposition: A | Discharge status: Home / Self Care | Payer: Self-pay | Source: Home / Self Care | Attending: Family Medicine

## 2012-07-18 ENCOUNTER — Inpatient Hospital Stay (HOSPITAL_COMMUNITY): Payer: 59 | Admitting: Anesthesiology

## 2012-07-18 HISTORY — PX: CYSTOSCOPY: SHX5120

## 2012-07-18 HISTORY — PX: MASS EXCISION: SHX2000

## 2012-07-18 LAB — GLUCOSE, CAPILLARY: Glucose-Capillary: 141 mg/dL — ABNORMAL HIGH (ref 70–99)

## 2012-07-18 LAB — URINE CULTURE: Culture: NO GROWTH

## 2012-07-18 SURGERY — CYSTOSCOPY
Anesthesia: General | Site: Groin | Laterality: Right | Wound class: Clean Contaminated

## 2012-07-18 MED ORDER — BUPIVACAINE HCL 0.25 % IJ SOLN
INTRAMUSCULAR | Status: DC | PRN
  Administered 2012-07-18: 30 mL

## 2012-07-18 MED ORDER — LIDOCAINE HCL (CARDIAC) 20 MG/ML IV SOLN
INTRAVENOUS | Status: DC | PRN
  Administered 2012-07-18: 50 mg via INTRAVENOUS

## 2012-07-18 MED ORDER — PROMETHAZINE HCL 25 MG/ML IJ SOLN: 6.2500 mg | INTRAMUSCULAR | Status: DC | PRN

## 2012-07-18 MED ORDER — IOHEXOL 300 MG/ML  SOLN
80.0000 mL | Freq: Once | INTRAMUSCULAR | Status: AC | PRN
  Administered 2012-07-18: 80 mL via INTRAVENOUS

## 2012-07-18 MED ORDER — LACTATED RINGERS IV SOLN: INTRAVENOUS | Status: DC

## 2012-07-18 MED ORDER — 0.9 % SODIUM CHLORIDE (POUR BTL) OPTIME
TOPICAL | Status: DC | PRN
  Administered 2012-07-18: 1000 mL

## 2012-07-18 MED ORDER — BENZOCAINE 20 % MT SOLN
Freq: Four times a day (QID) | OROMUCOSAL | Status: DC | PRN
  Administered 2012-07-18 (×2): via OROMUCOSAL
  Filled 2012-07-18: qty 57

## 2012-07-18 MED ORDER — FENTANYL CITRATE 0.05 MG/ML IJ SOLN
INTRAMUSCULAR | Status: DC | PRN
  Administered 2012-07-18: 100 ug via INTRAVENOUS
  Administered 2012-07-18 (×3): 50 ug via INTRAVENOUS

## 2012-07-18 MED ORDER — ONDANSETRON HCL 4 MG/2ML IJ SOLN
INTRAMUSCULAR | Status: DC | PRN
  Administered 2012-07-18: 4 mg via INTRAVENOUS

## 2012-07-18 MED ORDER — ACETAMINOPHEN 10 MG/ML IV SOLN
INTRAVENOUS | Status: DC | PRN
  Administered 2012-07-18: 1000 mg via INTRAVENOUS

## 2012-07-18 MED ORDER — HYDROCODONE-ACETAMINOPHEN 5-325 MG PO TABS
1.0000 | ORAL_TABLET | Freq: Four times a day (QID) | ORAL | Status: DC | PRN
  Administered 2012-07-18 – 2012-07-20 (×4): 2 via ORAL
  Filled 2012-07-18 (×4): qty 2

## 2012-07-18 MED ORDER — MIDAZOLAM HCL 5 MG/5ML IJ SOLN
INTRAMUSCULAR | Status: DC | PRN
  Administered 2012-07-18: 2 mg via INTRAVENOUS

## 2012-07-18 MED ORDER — PROPOFOL 10 MG/ML IV BOLUS
INTRAVENOUS | Status: DC | PRN
  Administered 2012-07-18: 200 mg via INTRAVENOUS

## 2012-07-18 MED ORDER — LACTATED RINGERS IV SOLN
INTRAVENOUS | Status: DC | PRN
  Administered 2012-07-18: 08:00:00 via INTRAVENOUS

## 2012-07-18 MED ORDER — HYDROMORPHONE HCL PF 1 MG/ML IJ SOLN: 0.2500 mg | INTRAMUSCULAR | Status: DC | PRN

## 2012-07-18 SURGICAL SUPPLY — 48 items
ADH SKN CLS APL DERMABOND .7 (GAUZE/BANDAGES/DRESSINGS) ×3
APL SKNCLS STERI-STRIP NONHPOA (GAUZE/BANDAGES/DRESSINGS) ×3
BAG URO CATCHER STRL LF (DRAPE) ×4 IMPLANT
BANDAGE GAUZE ELAST BULKY 4 IN (GAUZE/BANDAGES/DRESSINGS) ×4 IMPLANT
BENZOIN TINCTURE PRP APPL 2/3 (GAUZE/BANDAGES/DRESSINGS) ×4 IMPLANT
BLADE HEX COATED 2.75 (ELECTRODE) ×4 IMPLANT
BLADE SURG 15 STRL LF DISP TIS (BLADE) ×3 IMPLANT
BLADE SURG 15 STRL SS (BLADE) ×4
BLADE SURG SZ10 CARB STEEL (BLADE) ×2 IMPLANT
CLOTH BEACON ORANGE TIMEOUT ST (SAFETY) ×4 IMPLANT
DERMABOND ADVANCED (GAUZE/BANDAGES/DRESSINGS) ×1
DERMABOND ADVANCED .7 DNX12 (GAUZE/BANDAGES/DRESSINGS) ×1 IMPLANT
DRAIN CHANNEL RND F F (WOUND CARE) ×2 IMPLANT
DRAIN PENROSE 18X1/2 LTX STRL (DRAIN) ×2 IMPLANT
DRAIN PENROSE 18X1/4 LTX STRL (WOUND CARE) ×4 IMPLANT
DRAPE CAMERA CLOSED 9X96 (DRAPES) ×4 IMPLANT
DRSG PAD ABDOMINAL 8X10 ST (GAUZE/BANDAGES/DRESSINGS) ×2 IMPLANT
ELECT REM PT RETURN 9FT ADLT (ELECTROSURGICAL) ×4
ELECTRODE REM PT RTRN 9FT ADLT (ELECTROSURGICAL) ×3 IMPLANT
EVACUATOR SILICONE 100CC (DRAIN) ×2 IMPLANT
GLOVE BIOGEL M STRL SZ7.5 (GLOVE) ×6 IMPLANT
GOWN STRL NON-REIN LRG LVL3 (GOWN DISPOSABLE) ×8 IMPLANT
GOWN STRL REIN XL XLG (GOWN DISPOSABLE) ×4 IMPLANT
KIT BASIN OR (CUSTOM PROCEDURE TRAY) ×4 IMPLANT
MANIFOLD NEPTUNE II (INSTRUMENTS) ×4 IMPLANT
NEEDLE HYPO 22GX1.5 SAFETY (NEEDLE) ×2 IMPLANT
NS IRRIG 1000ML POUR BTL (IV SOLUTION) ×2 IMPLANT
PACK BASIC VI WITH GOWN DISP (CUSTOM PROCEDURE TRAY) ×4 IMPLANT
PACK CYSTO (CUSTOM PROCEDURE TRAY) ×4 IMPLANT
PENCIL BUTTON HOLSTER BLD 10FT (ELECTRODE) ×4 IMPLANT
PLUG CATH AND CAP STER (CATHETERS) ×2 IMPLANT
SET CYSTO W/LG BORE CLAMP LF (SET/KITS/TRAYS/PACK) ×2 IMPLANT
SPONGE DRAIN TRACH 4X4 STRL 2S (GAUZE/BANDAGES/DRESSINGS) ×2 IMPLANT
SPONGE GAUZE 4X4 12PLY (GAUZE/BANDAGES/DRESSINGS) ×6 IMPLANT
SPONGE LAP 4X18 X RAY DECT (DISPOSABLE) ×8 IMPLANT
SUPPORT SCROTAL LG STRP (MISCELLANEOUS) ×4 IMPLANT
SUT CHROMIC 3 0 SH 27 (SUTURE) ×4 IMPLANT
SUT CHROMIC 4 0 SH 27 (SUTURE) IMPLANT
SUT ETHILON 2 0 PS N (SUTURE) ×2 IMPLANT
SUT VIC AB 2-0 UR6 27 (SUTURE) ×6 IMPLANT
SUT VIC AB 3-0 SH 18 (SUTURE) ×2 IMPLANT
SUT VIC AB 3-0 SH 27 (SUTURE) ×4
SUT VIC AB 3-0 SH 27XBRD (SUTURE) ×3 IMPLANT
SUT VICRYL 0 TIES 12 18 (SUTURE) ×4 IMPLANT
SYR CONTROL 10ML LL (SYRINGE) ×2 IMPLANT
TAPE CLOTH SURG 4X10 WHT LF (GAUZE/BANDAGES/DRESSINGS) ×2 IMPLANT
TUBING CONNECTING 10 (TUBING) ×4 IMPLANT
WATER STERILE IRR 1500ML POUR (IV SOLUTION) IMPLANT

## 2012-07-18 NOTE — Progress Notes (Signed)
MD notified of need for transfer orders for procedure at [facility] this am.

## 2012-07-18 NOTE — Transfer of Care (Signed)
Immediate Anesthesia Transfer of Care Note  Patient: Theodore Flores  Procedure(s) Performed: Procedure(s) (LRB) with comments: CYSTOSCOPY (N/A) EXCISION MASS (Right)  Patient Location: PACU  Anesthesia Type:General  Level of Consciousness: awake, alert  and oriented  Airway & Oxygen Therapy: Patient Spontanous Breathing and Patient connected to face mask oxygen  Post-op Assessment: Report given to PACU RN and Post -op Vital signs reviewed and stable  Post vital signs: Reviewed and stable  Complications: No apparent anesthesia complications

## 2012-07-18 NOTE — Progress Notes (Signed)
pacu nursing:  [facility] notified of transfer needed back to [id number]

## 2012-07-18 NOTE — Progress Notes (Signed)
Patient picked up by [facility], left floor at 06:31.

## 2012-07-18 NOTE — H&P (Signed)
INTERVAL H&P  S: No acute events overnight. Now transferred to [facility] for groin mass excision and cysto  O: NAD Rt groin mass with ulceration, no pus, no adenopathy UOP excellent  A/P: 1 - Rt Groin Mass - proceed with local excision and cysto as previously discussed. Plan for transfer back to [facility] afterwards.

## 2012-07-18 NOTE — Brief Op Note (Signed)
07/16/2012 - 07/18/2012  9:06 AM  PATIENT:  Theodore Flores  56 y.o. male  PRE-OPERATIVE DIAGNOSIS:  right Inguinal-scrotal mass  POST-OPERATIVE DIAGNOSIS:  right Inguinal-scrotal mass  PROCEDURE:  Procedure(s) (LRB) with comments: CYSTOSCOPY (N/A) EXCISION MASS (Right) 6cm with reconstruction  SURGEON:  Surgeon(s) and Role:    * Sebastian Ache, MD - Primary  PHYSICIAN ASSISTANT:   ASSISTANTS: none   ANESTHESIA:   local and general  EBL:  Total I/O In: -  Out: 200 [Urine:100; Blood:100]  BLOOD ADMINISTERED:none  DRAINS: JP in Rt groin to bulb suction   LOCAL MEDICATIONS USED:  MARCAINE     SPECIMEN:  Source of Specimen:  Rt inguinal-scrotal mass  DISPOSITION OF SPECIMEN:  PATHOLOGY  COUNTS:  YES  TOURNIQUET:  * No tourniquets in log *  DICTATION: .Other Dictation: Dictation Number [id number]  PLAN OF CARE: Admit to inpatient   PATIENT DISPOSITION:  PACU - hemodynamically stable.   Delay start of Pharmacological VTE agent (>24hrs) due to surgical blood loss or risk of bleeding: no

## 2012-07-18 NOTE — Anesthesia Preprocedure Evaluation (Signed)
Anesthesia Evaluation  Patient identified by MRN, date of birth, ID band Patient awake    Reviewed: Allergy & Precautions, H&P , NPO status , Patient's Chart, lab work & pertinent test results  Airway Mallampati: II  Neck ROM: Full    Dental  (+) Teeth Intact, Caps and Dental Advisory Given,    Pulmonary neg pulmonary ROS,  breath sounds clear to auscultation  Pulmonary exam normal       Cardiovascular hypertension, Pt. on medications Rhythm:Regular Rate:Normal     Neuro/Psych negative neurological ROS  negative psych ROS   GI/Hepatic negative GI ROS, Neg liver ROS,   Endo/Other  diabetes, Type 2, Oral Hypoglycemic AgentsMorbid obesity  Renal/GU negative Renal ROS  negative genitourinary   Musculoskeletal negative musculoskeletal ROS (+)   Abdominal   Peds negative pediatric ROS (+)  Hematology negative hematology ROS (+)   Anesthesia Other Findings   Reproductive/Obstetrics negative OB ROS                           Anesthesia Physical Anesthesia Plan  ASA: III and emergent  Anesthesia Plan: General   Post-op Pain Management:    Induction: Intravenous  Airway Management Planned: LMA  Additional Equipment:   Intra-op Plan:   Post-operative Plan: Extubation in OR  Informed Consent: I have reviewed the patients History and Physical, chart, labs and discussed the procedure including the risks, benefits and alternatives for the proposed anesthesia with the patient or authorized representative who has indicated his/her understanding and acceptance.   Dental advisory given  Plan Discussed with:   Anesthesia Plan Comments:         Anesthesia Quick Evaluation

## 2012-07-18 NOTE — Anesthesia Postprocedure Evaluation (Signed)
Anesthesia Post Note  Patient: Theodore Flores  Procedure(s) Performed: Procedure(s) (LRB): CYSTOSCOPY (N/A) EXCISION MASS (Right)  Anesthesia type: General  Patient location: PACU  Post pain: Pain level controlled  Post assessment: Post-op Vital signs reviewed  Last Vitals:  Filed Vitals:   07/18/12 0950  BP:   Pulse: 90  Temp:   Resp: 16    Post vital signs: Reviewed  Level of consciousness: sedated  Complications: No apparent anesthesia complications

## 2012-07-18 NOTE — Progress Notes (Signed)
Family Medicine Teaching Service [facility] Progress Note  Patient name: Theodore Flores Medical record number: [id number] Date of birth: 12/21/1955 Age: 56 y.o. Gender: male    LOS: 2 days   Primary Care Provider: Sheila Oats, MD  Overnight Events:  NAEO. Feels well this am. Pt is now s/p resection of L groin/scrotal mass. No complaints. Feels hungry  Objective: Vital signs in last 24 hours: Temp:  [97.7 F (36.5 C)-98.7 F (37.1 C)] 97.7 F (36.5 C) (11/30 2202) Pulse Rate:  [73-87] 87  (11/30 2202) Resp:  [18-19] 18  (11/30 2202) BP: (143-179)/(77-96) 145/94 mmHg (11/30 2202) SpO2:  [99 %-100 %] 99 % (11/30 2202) Weight:  [244 lb (110.678 kg)] 244 lb (110.678 kg) (11/30 2202)  Wt Readings from Last 3 Encounters:  07/17/12 244 lb (110.678 kg)  07/17/12 244 lb (110.678 kg)  10/21/11 250 lb 3.2 oz (113.49 kg)     Current Facility-Administered Medications  Medication Dose Route Frequency Provider Last Rate Last Dose  . 0.9 %  sodium chloride infusion  250 mL Intravenous PRN Theodore Singleton, MD      . 0.9 % irrigation (POUR BTL)    PRN Theodore Ache, MD   1,000 mL at 07/18/12 0736  . acetaminophen (TYLENOL) tablet 650 mg  650 mg Oral Q6H PRN Theodore Singleton, MD   650 mg at 07/18/12 0009   Or  . acetaminophen (TYLENOL) suppository 650 mg  650 mg Rectal Q6H PRN Theodore Singleton, MD      . bupivacaine (MARCAINE) 0.25 % (with pres) injection    PRN Theodore Ache, MD   30 mL at 07/18/12 3086  . folic acid (FOLVITE) tablet 1 mg  1 mg Oral Daily Theodore Singleton, MD   1 mg at 07/17/12 1223  . heparin injection 5,000 Units  5,000 Units Subcutaneous Q8H Theodore Singleton, MD   5,000 Units at 07/17/12 2248  . insulin aspart (novoLOG) injection 0-9 Units  0-9 Units Subcutaneous TID WC Theodore Singleton, MD   2 Units at 07/17/12 1754  . lisinopril (PRINIVIL,ZESTRIL) tablet 20 mg  20 mg Oral Daily Theodore Singleton, MD   20 mg at 07/17/12 1223  . LORazepam  (ATIVAN) tablet 1 mg  1 mg Oral Q6H PRN Theodore Singleton, MD       Or  . LORazepam (ATIVAN) injection 1 mg  1 mg Intravenous Q6H PRN Theodore Singleton, MD      . morphine 2 MG/ML injection 2 mg  2 mg Intravenous Q2H PRN Theodore Singleton, MD   2 mg at 07/18/12 5784  . multivitamin with minerals tablet 1 tablet  1 tablet Oral Daily Theodore Singleton, MD   1 tablet at 07/17/12 1224  . piperacillin-tazobactam (ZOSYN) IVPB 3.375 g  3.375 g Intravenous Q8H Theodore Letters, MD   3.375 g at 07/17/12 2248  . sodium chloride 0.9 % injection 3 mL  3 mL Intravenous Q12H Theodore Singleton, MD   3 mL at 07/17/12 0304  . sodium chloride 0.9 % injection 3 mL  3 mL Intravenous PRN Theodore Singleton, MD      . thiamine (VITAMIN B-1) tablet 100 mg  100 mg Oral Daily Theodore Singleton, MD   100 mg at 07/17/12 1224   Or  . thiamine (B-1) injection 100 mg  100 mg Intravenous Daily Theodore Singleton, MD      . vancomycin (VANCOCIN) IVPB 1000 mg/200 mL premix  1,000  mg Intravenous Q8H Theodore Letters, MD   1,000 mg at 07/18/12 9604   Facility-Administered Medications Ordered in Other Encounters  Medication Dose Route Frequency Provider Last Rate Last Dose  . acetaminophen (OFIRMEV) IV    PRN Theodore Niemann [place], CRNA   1,000 mg at 07/18/12 5409  . fentaNYL (SUBLIMAZE) injection    PRN Theodore Buckles, CRNA   50 mcg at 07/18/12 0804  . lactated ringers infusion    Continuous PRN [employer], CRNA      . lidocaine (cardiac) 100 mg/54ml (XYLOCAINE) 20 MG/ML injection 2%    PRN [facility], CRNA   50 mg at 07/18/12 0747  . midazolam (VERSED) 5 MG/5ML injection    PRN Theodore Buckles, CRNA   2 mg at 07/18/12 0739  . propofol (DIPRIVAN) 10 mg/mL bolus/IV push    PRN [employer], CRNA   200 mg at 07/18/12 0747     PE: Gen: NAD, pleasant HEENT: mmm EOMI CV: RRR no m/r/g Res: CTAB, normal effort Abd: NABS, non painful to palpation Skin: dressing  inplace and c/d/i in R groin w/ JP drain in place w/ serosanguinous drainage Neuro: CN grossly intact  Labs/Studies:  Results for orders placed during the hospital encounter of 07/16/12 (from the past 24 hour(s))  GLUCOSE, CAPILLARY     Status: Abnormal   Collection Time   07/17/12  4:44 PM      Component Value Range   Glucose-Capillary 176 (*) 70 - 99 mg/dL  GLUCOSE, CAPILLARY     Status: Abnormal   Collection Time   07/17/12 10:01 PM      Component Value Range   Glucose-Capillary 169 (*) 70 - 99 mg/dL   Comment 1 Notify RN    GLUCOSE, CAPILLARY     Status: Abnormal   Collection Time   07/18/12  6:16 AM      Component Value Range   Glucose-Capillary 184 (*) 70 - 99 mg/dL    Lab 81/19/14 7829 56/21/30 2158  WBC 5.9 6.3  HGB 12.7* 13.4  HCT 38.6* 39.5  PLT 216 240     Lab 07/17/12 0515 07/16/12 2158  NA -- 137  K -- 3.8  CL -- 103  CO2 -- 24  BUN -- 12  CREATININE 0.97 0.91  LABGLOM -- --  GLUCOSE -- 226*  CALCIUM -- 9.2    Dg Chest 2 View  07/17/2012   IMPRESSION: Slight fullness of the azygos region which could represent adenopathy.  Otherwise,  benign-appearing chest.   Original Report Authenticated By: Theodore Flores, M.D.    Ct Pelvis W Contrast  07/16/2012  IMPRESSION:  1.  Soft tissue disruption along the anterior aspect of the right scrotum, with associated mild bilateral scrotal wall edema, mildly more prominent on the right.  No significant hydrocele seen; no free air seen to suggest Fournier's gangrene.  No evidence of focal abscess. 2.  Small right inguinal hernia, containing only fat. 3.  Mild scattered vascular calcifications noted.   Original Report Authenticated By: Theodore Flores, M.D.         Assessment/Plan: Theodore Flores is a 56 y.o. year old male w/ h/o HTN, and DM presenting with right scrotal lesion, now s/o resection  1. Scrotal lesion: s/p resection by Urology (Theodore Flores) on 07/18/12. Malignancy vs superficial infection. HIV and RPR are  NR. Pt feels well at this time. Wound cx w/ G- and G+ as expected given location. 1. Monitor UOP  closely 2. F/u for path likely as outpt 3. CXR w/ questionable "fullness" . Given concern for malignancy will order CT chest w/ contrast  4. DC Vanc. And keep Zosyn for now. Likely Dc tomorrow 5. F/u Urology recs. - likely DC 12/2 6. Will change pain regimen to Norco w/ PRN morphine for breakthrough  2. HTN: Currently somewhat above ideal blood pressure.  1. Cont home meds 2. Watch and consider titration if remains above goal   3. Diabetes: A1c on admission 7.2 (improved from previous).  1. Continue to hold metformin as CT w/ contrast today 2. SSI  4. Alcohol use: No signs of withdrawal at this time.  1. cont CIWA  5. FEN/GI:  1. Carb mod diet 2. SLIV  6. Prophylaxis: SQH 7. Disposition: Pending drain output and pain. Likely Dc on 12/2 8. Code Status: Full cod     LOS 2  Signed: Shelly Flatten, MD Family Medicine Resident PGY-2 [phone] 07/18/2012 8:26 AM

## 2012-07-19 ENCOUNTER — Encounter (HOSPITAL_COMMUNITY): Payer: Self-pay | Admitting: Urology

## 2012-07-19 LAB — GLUCOSE, CAPILLARY
Glucose-Capillary: 131 mg/dL — ABNORMAL HIGH (ref 70–99)
Glucose-Capillary: 119 mg/dL — ABNORMAL HIGH (ref 70–99)

## 2012-07-19 LAB — WOUND CULTURE

## 2012-07-19 NOTE — Progress Notes (Signed)
FMTS Attending Note Patient seen and examined by me; discussed with resident team and I agree with resident plan.  Patient with scrotal lesion that, according to op note, did not involve contents of scrotum.  Suspicious for metastatic disease, possibly metastatic squamous cell carcinoma.  For mediastinal node biopsy before discharge; also to establish outpatient follow up with heme/onc.  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  Paula Compton, MD

## 2012-07-19 NOTE — Op Note (Signed)
NAME:  [name], [name] NO.:  [id number]  MEDICAL RECORD NO.:  [id number]  LOCATION:                               FACILITY:  [facility]  PHYSICIAN:  Sebastian Ache, MD     DATE OF BIRTH:  [birth year]  DATE OF PROCEDURE:  07/18/2012 DATE OF DISCHARGE:                              OPERATIVE REPORT   DIAGNOSIS:  Right scrotal-inguinal mass.  PROCEDURE: 1. Excision of right scrotal inguinal mass, approximately size 6 cm     with reconstruction. 2. Cystoscopy.  FINDINGS: 1. Unremarkable urethra and urinary bladder. 2. Ulcerated 6 cm right groin mass without palpable adenopathy.  No     direct involvement of the penis or scrotal contents grossly.  DRAINS:  Jackson-Pratt drain bulb suction just beneath the Scarpa's fascia and the right inguinal area.  SPECIMEN:  Right inguinal scrotal mass for permanent pathology.  ESTIMATED BLOOD LOSS:  20 mL.  INDICATION:  Theodore Flores is a pleasant 56 year old gentleman with a several year history of an expanding right inguinal scrotal mass.  In the last month, the lesion became progressively more foul-smelling and at the urging of his wife, he presented to [facility] Emergency Room for evaluation.  He was admitted to the [facility].  I was consulted on the patient and felt that this likely represented neoplasm, possibly superinfected versus local infection.  Options were discussed for initial diagnostic and therapeutic including local excision.  He wished to proceed.  Informed consent was obtained and placed in medical record.  In addition, a cystoscopy would be warranted given the close orientation of the mass to the base of the penis to rule out any urethral involvement.  PROCEDURE IN DETAIL:  The patient being Theodore Flores, was verified.  The procedure being cystoscopy and right inguinal mass excision was confirmed.  Procedure was carried out.  Time-out was performed. Intravenous antibiotics administered.   General LMA anesthesia was introduced.  The patient was placed into supine position.  Sterile field was created by prepping and draping the patient's  scrotum, proximal thighs, infraumbilical abdomen using iodine.  Cystourethroscopy was performed using a 16-French flexible cystoscope.  Inspection of the anterior-posterior unremarkable.  Inspection of bladder revealed no diverticula, calcifications, papular lesions.  Ureteral orifices were in normal anatomic position.  There was mild bilobar prostatic hypertrophy. No evidence of mass or lesions within all segments of the urethra or bladder.  The cystoscope was then exchanged for a 16-French Foley catheter per urethra to straight drain.  The bladder was emptied and this was capped.  Attention was directed to excision of the mass.  The mass was circumscribed in an elliptical fashion, keeping approximately 1 cm gross margin away from the edges.  This was dissected down to the level of area of the scrotum.  The mass did not grossly involve the scrotal contents.  This was separated from underlying fiber fatty tissue using Bovie electrocautery in a systematic fashion.  It was then completely freed up the mass palpably and grossly negative margins. This was set aside for permanent pathology.  Additional hemostasis was achieved with point Bovie electrocautery.  Inspection of the wound base revealed excellent hemostasis.  There was no violation of the bulb of the penis, corpora, or scrotal contents.  Attention was directed to reconstruction.  Figure-of-eight 2-0 Vicryl was used to reapproximate the deep fiber fatty layers along the long axis of the elliptical incision.  A closed suction drain was brought through a small counter incision 3 fingerbreadths superior to the superior aspect of the wound. The Scarpa's fascia was then closed over this again using interrupted 2- 0 Vicryl.  The skin was reapproximated using interrupted mattress of 3-0 Vicryl  followed by Dermabond.  All sponge and needle counts were correct.  Hemostasis appeared excellent.  Cosmesis appeared acceptable. Foley catheter was removed.  Procedure was terminated.  The patient tolerated the procedure and there were no immediate periprocedural complications.  The patient was taken to postanesthesia care unit in stable condition.          ______________________________ Sebastian Ache, MD     TM/MEDQ  D:  07/18/2012  T:  07/18/2012  Job:  [id number]

## 2012-07-19 NOTE — Progress Notes (Signed)
Patient ID: Theodore Flores, male   DOB: [birth year], 55 y.o.   MRN: [id number] [facility] Medicine Teaching Service PGY-1 Progress Note   Overnight Events: Patient reports no overnight events, doing well. The surgical team came and removed the drain today.  He reports small an=mount of drainage initially, but now has resolved. His pain is well controlled. He he reports he is feeling "100% better."   Objective: Temp:  [97.8 F (36.6 C)-98.3 F (36.8 C)] 98.1 F (36.7 C) (12/02 0948) Pulse Rate:  [88-108] 88  (12/02 0948) Cardiac Rhythm:  [-]  Resp:  [18-20] 18  (12/02 0948) BP: (128-156)/(82-91) 150/90 mmHg (12/02 0948) SpO2:  [95 %-99 %] 95 % (12/02 0948) Weight change:   Physical Exam: Gen: NAD. Pleasant.  CV: RRR. No murmur. Lungs: CTAB. No wheezing, rhonchi or rales.  Abd: Soft. NT.ND. No HSM EXT: NT. No erythema. No edema.  Incision site: Mesh panties and loose dressing over, dry and intact.  CBC    Component Value Date/Time   WBC 5.9 07/17/2012 0515   RBC 4.31 07/17/2012 0515   HGB 12.7* 07/17/2012 0515   HCT 38.6* 07/17/2012 0515   PLT 216 07/17/2012 0515   MCV 89.6 07/17/2012 0515   MCH 29.5 07/17/2012 0515   MCHC 32.9 07/17/2012 0515   RDW 12.8 07/17/2012 0515   LYMPHSABS 2.6 07/16/2012 2158   MONOABS 0.4 07/16/2012 2158   EOSABS 0.1 07/16/2012 2158   BASOSABS 0.0 07/16/2012 2158    CMP     Component Value Date/Time   NA 137 07/16/2012 2158   K 3.8 07/16/2012 2158   CL 103 07/16/2012 2158   CO2 24 07/16/2012 2158   GLUCOSE 226* 07/16/2012 2158   BUN 12 07/16/2012 2158   CREATININE 0.97 07/17/2012 0515   CREATININE 1.03 10/21/2011 1944   CALCIUM 9.2 07/16/2012 2158   PROT 6.8 07/16/2012 2158   ALBUMIN 3.3* 07/16/2012 2158   AST 16 07/16/2012 2158   ALT 18 07/16/2012 2158   ALKPHOS 85 07/16/2012 2158   BILITOT 0.2* 07/16/2012 2158   GFRNONAA 90* 07/17/2012 0515   GFRAA >90 07/17/2012 0515    BMET    Component Value Date/Time   NA 137 07/16/2012  2158   K 3.8 07/16/2012 2158   CL 103 07/16/2012 2158   CO2 24 07/16/2012 2158   GLUCOSE 226* 07/16/2012 2158   BUN 12 07/16/2012 2158   CREATININE 0.97 07/17/2012 0515   CREATININE 1.03 10/21/2011 1944   CALCIUM 9.2 07/16/2012 2158   GFRNONAA 90* 07/17/2012 0515   GFRAA >90 07/17/2012 0515   HIV and RPR NON REACTIVE (11/30 0515) NON REACTIVE (11/30 0515) Lab Results  Component Value Date   HGBA1C 7.2* 07/17/2012     Assessment and Plan: Theodore Flores is a 56 y.o. year old male presenting with right scrotal infection vs malignancy Scrotal lesion:  - Cont Vanc/Zosyn D/C'd d/t lab findings of probable malignancy over infection and no systemic signs of infection. - F/u wound culture: Pending - Consulted urology, thank you for recommendations.  - Morphine 2mg  q2hr prn pain  - Consult IR and hemonc HTN:  - Lisinopril Diabetes:  - Hold metformin until 48 hours post CT - SSI - AIC: 7.2 Alcohol use:  - Given daily intake, CIWA FEN/GI: Carb modified diet Prophylaxis: SQH Disposition: Consult IR, possible discharge after biopsy. Code Status: Full code

## 2012-07-19 NOTE — Progress Notes (Signed)
1 Day Post-Op  Subjective: 1 - Rt Inguinal - Groin Mass - POD 1 s/p local excision of complex groin mass worrisome for neoplasm. No wound problems. Drain with minimal output. Pain controlled  Worrisome chest adenopathy seen on CXR and CT. Will need eventual IR biopsy of this.   Objective: Vital signs in last 24 hours: Temp:  [97.6 F (36.4 C)-98.3 F (36.8 C)] 97.8 F (36.6 C) (12/02 0615) Pulse Rate:  [83-108] 92  (12/02 0615) Resp:  [11-23] 20  (12/02 0615) BP: (121-156)/(79-91) 147/83 mmHg (12/02 0615) SpO2:  [95 %-100 %] 99 % (12/02 0615) Last BM Date: 07/18/12  Intake/Output from previous day: 12/01 0701 - 12/02 0700 In: 2035 [P.O.:840; I.V.:945; IV Piggyback:250] Out: 1200 [Urine:1100; Blood:100] Intake/Output this shift:    General appearance: alert, cooperative, appears stated age and Family at bedside Head: Normocephalic, without obvious abnormality, atraumatic Eyes: conjunctivae/corneas clear. PERRL, EOM's intact. Fundi benign. Ears: normal TM's and external ear canals both ears Back: symmetric, no curvature. ROM normal. No CVA tenderness. Resp: clear to auscultation bilaterally Cardio: regular rate and rhythm, S1, S2 normal, no murmur, click, rub or gallop Male genitalia: normal Pulses: 2+ and symmetric Skin: Skin color, texture, turgor normal. No rashes or lesions Lymph nodes: Cervical, supraclavicular, and axillary nodes normal. Neurologic: Grossly normal Incision/Wound: Rt groin incision c/d/i. JP with minimal serous output, removed and dry dressing applied. NO hemaotma / seroma. No erythema or pus.   Lab Results:   [facility] 07/17/12 0515 07/16/12 2158  WBC 5.9 6.3  HGB 12.7* 13.4  HCT 38.6* 39.5  PLT 216 240   BMET  Basename 07/17/12 0515 07/16/12 2158  NA -- 137  K -- 3.8  CL -- 103  CO2 -- 24  GLUCOSE -- 226*  BUN -- 12  CREATININE 0.97 0.91  CALCIUM -- 9.2   PT/INR No results found for this basename: LABPROT:2,INR:2 in the last 72  hours ABG No results found for this basename: PHART:2,PCO2:2,PO2:2,HCO3:2 in the last 72 hours  Studies/Results: Dg Chest 2 View  07/17/2012  *RADIOLOGY REPORT*  Clinical Data: Right scrotal mass.  Cough and chest congestion.  CHEST - 2 VIEW  Comparison: None.  Findings: Heart size and pulmonary vascularity are normal and the lungs are clear.  No significant osseous abnormality. Slight fullness in the azygos region, nonspecific but the possibility of adenopathy should be considered.  IMPRESSION: Slight fullness of the azygos region which could represent adenopathy.  Otherwise,  benign-appearing chest.   Original Report Authenticated By: Francene Boyers, M.D.    Ct Chest W Contrast  07/18/2012  *RADIOLOGY REPORT*  Clinical Data: Scrotal mass.  Abnormal chest x-ray.  CT CHEST WITH CONTRAST  Technique:  Multidetector CT imaging of the chest was performed following the standard protocol during bolus administration of intravenous contrast.  Contrast: 80mL OMNIPAQUE IOHEXOL 300 MG/ML  SOLN  Comparison: Chest x-ray dated 07/17/2012  Findings: There is abnormal adenopathy in the azygos region measuring 3.5 x 3.1 x 2.2 cm.  This is worrisome for metastatic disease.  There are no pulmonary nodules.  Heart size is normal. Moderate coronary artery calcification.  Minimal atelectasis at the lung bases posteriorly.  No osseous abnormality.  Small calcified granuloma adjacent to the right hemidiaphragm.  On the lowest image of the scan there is a partially enhancing 18 mm lesion in the right lobe of the liver just inferior posterior to the gallbladder.  This is nonspecific.  It could be a benign hemangioma but given the probable  metastatic disease, the possibility of tumor should be considered.  IMPRESSION: Abnormal mediastinal adenopathy in the azygos region worrisome for metastatic disease.  Possible lesion in the right lobe of the liver, indeterminate.  It is incompletely visualized on this chest CT scan.  CT scan of  the abdomen and pelvis with intravenous contrast may be useful for further evaluation.   Original Report Authenticated By: Francene Boyers, M.D.     Anti-infectives: Anti-infectives     Start     Dose/Rate Route Frequency Ordered Stop   07/17/12 0600   vancomycin (VANCOCIN) IVPB 1000 mg/200 mL premix  Status:  Discontinued        1,000 mg 200 mL/hr over 60 Minutes Intravenous Every 8 hours 07/17/12 0238 07/18/12 1155   07/17/12 0600   piperacillin-tazobactam (ZOSYN) IVPB 3.375 g        3.375 g 12.5 mL/hr over 240 Minutes Intravenous 3 times per day 07/17/12 0238     07/16/12 2315   vancomycin (VANCOCIN) IVPB 1000 mg/200 mL premix        1,000 mg 200 mL/hr over 60 Minutes Intravenous  Once 07/16/12 2301 07/17/12 0131   07/16/12 2315   piperacillin-tazobactam (ZOSYN) IVPB 3.375 g        3.375 g 100 mL/hr over 30 Minutes Intravenous  Once 07/16/12 2301 07/17/12 0029          Assessment/Plan: 1 - Rt Inguinal - Groin Mass - path pending and will determine next theraputic maneuvers. Chest adenopathy worrisome for possible distant disease.  Cleared for DC home from surgical perspective. I will arrange outpatient f/u with me at [facility] [phone].  LOS: 3 days    [facility], Garfield Coiner 07/19/2012

## 2012-07-20 ENCOUNTER — Telehealth: Payer: Self-pay | Admitting: Oncology

## 2012-07-20 DIAGNOSIS — R1909 Other intra-abdominal and pelvic swelling, mass and lump: Secondary | ICD-10-CM | POA: Diagnosis present

## 2012-07-20 LAB — GLUCOSE, CAPILLARY: Glucose-Capillary: 140 mg/dL — ABNORMAL HIGH (ref 70–99)

## 2012-07-20 MED ORDER — HYDROCODONE-ACETAMINOPHEN 5-325 MG PO TABS
1.0000 | ORAL_TABLET | Freq: Four times a day (QID) | ORAL | Status: DC | PRN
Start: 2012-07-20 — End: 2012-12-01

## 2012-07-20 NOTE — Discharge Summary (Signed)
Physician Discharge Summary  Patient ID: GREGARY BLACKARD MRN: [id number] DOB: 06-08-1956 Age: 56 y.o.  Admit date: 07/16/2012 Discharge date: 07/20/2012 Admitting Physician: Sanjuana Letters, MD  PCP: Sheila Oats, MD  Consultants:Urology, Oncology ambulatory referral      Discharge Diagnosis:  Principal Problem:  *Mass of right inguinal region Active Problems:  Hypertension  Diabetes mellitus type 2 in obese    Hospital Course Mr. Mau is a 56 y/o male who presented to the ED with a right scrotal/inguinal mass that was concerning for infection vs malignancy.   1) Right Scrotal/Inguinal Mass - Pt presented to the ED with worsening right inguinal region swelling concerning for a mass.  Per pt, this had been going on for the last month that had gradually enlarged and became painful along with yellowish drainage from the area.  A wound Cx was obtained and sent in the ED, pt was started on vanc/zosyn, and urology was consulted.  Urology sent the patient for a groin mass excision and cysto which were pending at the time of d/c.  Also, a CXR was ordered which showed possible adenopathy in the azygos region.  The wound Cx grew GNR and Gram positive organisms, which most likely represented normal skin flora and the Vanc/Zosyn was d/c.  A CT scan w/ contrast was ordered of the chest and pelvis which showed azygos adenopathy concerning for metastatic disease, and a small lesion in the right lobe of the liver.  Due to the possible of metastatic disease, pt was scheduled as an outpatient with oncology for staging/further management.    2) HTN - Stable, continued home meds   3) DM II - Metformin was held due to CT.  Pt was placed on SSI and an A1C was 7.2.  Stable during his hospital stay and d/c on home meds.         Discharge PE   Filed Vitals:   07/20/12 1000  BP: 141/83  Pulse: 97  Temp: 98 F (36.7 C)  Resp: 16   Gen: NAD, pleasant  HEENT: mmm EOMI  CV: RRR no m/r/g   Res: CTAB, normal effort  Abd: NABS, non painful to palpation  Skin: dressing in place, no evidence of erythema around dressing  Neuro: CN grossly intact      Procedures/Imaging:  Dg Chest 2 View  07/17/2012   IMPRESSION: Slight fullness of the azygos region which could represent adenopathy.  Otherwise,  benign-appearing chest.   Original Report Authenticated By: Francene Boyers, M.D.    Ct Chest W Contrast  07/18/2012   IMPRESSION: Abnormal mediastinal adenopathy in the azygos region worrisome for metastatic disease.  Possible lesion in the right lobe of the liver, indeterminate.  It is incompletely visualized on this chest CT scan.  CT scan of the abdomen and pelvis with intravenous contrast may be useful for further evaluation.   Original Report Authenticated By: Francene Boyers, M.D.    Ct Pelvis W Contrast  07/16/2012   IMPRESSION:  1.  Soft tissue disruption along the anterior aspect of the right scrotum, with associated mild bilateral scrotal wall edema, mildly more prominent on the right.  No significant hydrocele seen; no free air seen to suggest Fournier's gangrene.  No evidence of focal abscess. 2.  Small right inguinal hernia, containing only fat. 3.  Mild scattered vascular calcifications noted.   Original Report Authenticated By: Tonia Ghent, M.D.     Labs  CBC  Lab 07/17/12 0515 07/16/12 2158  WBC 5.9 6.3  HGB 12.7* 13.4  HCT 38.6* 39.5  PLT 216 240   BMET  Lab 07/17/12 0515 07/16/12 2158  NA -- 137  K -- 3.8  CL -- 103  CO2 -- 24  BUN -- 12  CREATININE 0.97 0.91  CALCIUM -- 9.2  PROT -- 6.8  BILITOT -- 0.2*  ALKPHOS -- 85  ALT -- 18  AST -- 16  GLUCOSE -- 226*   HIV antibody, RPR - negative   Right Inguinal Mass Biopsy - Pending upon d/c.    Patient condition at time of discharge/disposition: stable  Disposition-home   Follow up issues: 1. Scrotal Mass Biopsy pending upon d/c. 2. F/U appointments with both urology and oncology for further  evaluation of the scrotal mass.   Discharge follow up:  Follow-up Information    Follow up with Sebastian Ache, MD. Schedule an appointment as soon as possible for a visit in 1 week.   Contact information:   [address] [place] [address] [phone]       Follow up with Levert Feinstein, MD. Schedule an appointment as soon as possible for a visit in 1 week.   Contact information:   [address] [place] [address] [phone]         Discharge Orders    Future Appointments: Provider: Department: Dept Phone: Center:   07/29/2012 10:30 AM [facility] MEDICAL ONCOLOGY [phone] None   07/29/2012 10:45 AM Delcie Roch [facility] MEDICAL ONCOLOGY [phone] None   07/29/2012 11:00 AM Benjiman Core, MD [facility] MEDICAL ONCOLOGY [phone] None     Future Orders Please Complete By Expires   Ambulatory referral to Oncology      Comments:   Pt with scrotal mass, awaiting biopsy.  Concerning for Met Squamous Cell Carc.  Will need staging.   Diet - low sodium heart healthy      Increase activity slowly      Call MD for:  severe uncontrolled pain      Call MD for:  persistant nausea and vomiting      Call MD for:  extreme fatigue      Call MD for:  persistant dizziness or light-headedness          Discharge Instructions: Please refer to Patient Instructions section of EMR for full details.  Patient was counseled important signs and symptoms that should prompt return to medical care, changes in medications, dietary instructions, activity restrictions, and follow up appointments.  Significant instructions noted below:    Discharge Medications   Medication List     As of 07/20/2012  9:09 PM    START taking these medications         HYDROcodone-acetaminophen 5-325 MG per tablet   Commonly known as: NORCO/VICODIN   Take 1-2 tablets by mouth every 6 (six) hours as  needed.      CONTINUE taking these medications         lisinopril 20 MG tablet   Commonly known as: PRINIVIL,ZESTRIL   Take 1 tablet (20 mg total) by mouth daily.      metFORMIN 1000 MG tablet   Commonly known as: GLUCOPHAGE   Take 1 tablet (1,000 mg total) by mouth 2 (two) times daily with a meal.          Where to get your medications    These are the prescriptions that you need to pick up.  You may get these medications from any pharmacy.         HYDROcodone-acetaminophen 5-325 MG per tablet                Gildardo Cranker, DO of [facility] 07/20/2012 9:08 PM

## 2012-07-20 NOTE — Progress Notes (Signed)
FMTS Attending NOte Patient for outpatient evaluation by oncology for suspicion of SCC with metastases.  He has had biopsy performed of scrotal lesion; for oncology to determine whether he will need further biopsy, pending results of scrotal pathology. Paula Compton, MD

## 2012-07-20 NOTE — Progress Notes (Signed)
Patient ID: DIONYSIOS MASSMAN, male   DOB: 1956-01-17, 56 y.o.   MRN: [id number] [facility] Medicine Teaching Service PGY-1 Progress Note   Overnight Events: Patient reports no overnight events, doing well. Is having swelling in the area but not having pain.     Objective: Temp:  [98.2 F (36.8 C)-98.5 F (36.9 C)] 98.2 F (36.8 C) (12/03 0535) Pulse Rate:  [84-88] 88  (12/03 0535) Cardiac Rhythm:  [-]  Resp:  [18-20] 18  (12/03 0535) BP: (132-159)/(78-94) 140/88 mmHg (12/03 0535) SpO2:  [92 %-94 %] 92 % (12/03 0535) Weight:  [241 lb 3.2 oz (109.408 kg)] 241 lb 3.2 oz (109.408 kg) (12/02 2314) Weight change:   Physical Exam: Gen: NAD. Pleasant.  CV: RRR. No murmur. Lungs: CTAB. No wheezing, rhonchi or rales.  Abd: Soft. NT.ND. No HSM EXT: NT. No erythema. No edema.  Incision site: Mesh panties and loose dressing over, dry and intact.  CBC    Component Value Date/Time   WBC 5.9 07/17/2012 0515   RBC 4.31 07/17/2012 0515   HGB 12.7* 07/17/2012 0515   HCT 38.6* 07/17/2012 0515   PLT 216 07/17/2012 0515   MCV 89.6 07/17/2012 0515   MCH 29.5 07/17/2012 0515   MCHC 32.9 07/17/2012 0515   RDW 12.8 07/17/2012 0515   LYMPHSABS 2.6 07/16/2012 2158   MONOABS 0.4 07/16/2012 2158   EOSABS 0.1 07/16/2012 2158   BASOSABS 0.0 07/16/2012 2158    CMP     Component Value Date/Time   NA 137 07/16/2012 2158   K 3.8 07/16/2012 2158   CL 103 07/16/2012 2158   CO2 24 07/16/2012 2158   GLUCOSE 226* 07/16/2012 2158   BUN 12 07/16/2012 2158   CREATININE 0.97 07/17/2012 0515   CREATININE 1.03 10/21/2011 1944   CALCIUM 9.2 07/16/2012 2158   PROT 6.8 07/16/2012 2158   ALBUMIN 3.3* 07/16/2012 2158   AST 16 07/16/2012 2158   ALT 18 07/16/2012 2158   ALKPHOS 85 07/16/2012 2158   BILITOT 0.2* 07/16/2012 2158   GFRNONAA 90* 07/17/2012 0515   GFRAA >90 07/17/2012 0515    BMET    Component Value Date/Time   NA 137 07/16/2012 2158   K 3.8 07/16/2012 2158   CL 103 07/16/2012 2158   CO2 24  07/16/2012 2158   GLUCOSE 226* 07/16/2012 2158   BUN 12 07/16/2012 2158   CREATININE 0.97 07/17/2012 0515   CREATININE 1.03 10/21/2011 1944   CALCIUM 9.2 07/16/2012 2158   GFRNONAA 90* 07/17/2012 0515   GFRAA >90 07/17/2012 0515   HIV and RPR NON REACTIVE (11/30 0515) NON REACTIVE (11/30 0515) Lab Results  Component Value Date   HGBA1C 7.2* 07/17/2012     Assessment and Plan: JERIC SLAGEL is a 56 y.o. year old male presenting with right scrotal mass concerning for malignancy  Scrotal lesion:  - Biopsy yesterday, awaiting path results - F/u wound culture: Pending - Consulted urology, will f/u in outpatient clinic.  Feel the chest adenopathy is concerning  - Morphine 2mg  q2hr prn pain  - Consult Pulmonology/IR for possible lymph node biopsy of mediastinal nodes.  If does not need to be done inpatient, can be performed as outpatient.   HTN:  - Lisinopril, well controlled   Diabetes:  - Hold metformin until 48 hours post CT - SSI - AIC: 7.2  Alcohol use:  - Given daily intake, CIWA  FEN/GI: Carb modified diet Prophylaxis: SQH Disposition: Consult IR/Pulmonology, possible discharge after biopsy. Code Status: Full code  Twana First Paulina Fusi, DO of [facility] 07/20/2012, 9:58 AM

## 2012-07-20 NOTE — Telephone Encounter (Signed)
S/W PT NURSE IN HOSPITAL IN REF TO NP APPT. ON 07/29/12@10 :30 REFERRING DR HESS DX-MET SQUAMOUS CELL CARC MAILED NP PACKET

## 2012-07-21 ENCOUNTER — Telehealth: Payer: Self-pay | Admitting: Urology

## 2012-07-21 ENCOUNTER — Telehealth: Payer: Self-pay | Admitting: Oncology

## 2012-07-21 NOTE — Telephone Encounter (Signed)
Informed pt of path report. 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 Reinforced that while this is good news, he will need close follow-up and potentially more therapy as well as further diagnostics related to possible mediastinal lymph nodes. He voiced understanding. He has f/u with me next week on 12/9 and voiced he will keep appt.

## 2012-07-21 NOTE — Discharge Summary (Signed)
Patient case discussed with resident team on day of discharge.  I agree with plans for discharge, including establishment of follow up as outpatient with oncology service after path report available from scrotal biopsy.  For oncology to determine necessity of further biopsy (liver, mediastinum) after review of scrotal biopsy and clinical assessment.  Paula Compton, MD

## 2012-07-21 NOTE — Telephone Encounter (Signed)
C/D 07/21/12 for appt.07/29/12

## 2012-07-23 ENCOUNTER — Other Ambulatory Visit: Payer: Self-pay | Admitting: Oncology

## 2012-07-23 DIAGNOSIS — R1909 Other intra-abdominal and pelvic swelling, mass and lump: Secondary | ICD-10-CM

## 2012-07-29 ENCOUNTER — Encounter: Payer: Self-pay | Admitting: Oncology

## 2012-07-29 ENCOUNTER — Other Ambulatory Visit (HOSPITAL_BASED_OUTPATIENT_CLINIC_OR_DEPARTMENT_OTHER): Payer: 59 | Admitting: Lab

## 2012-07-29 ENCOUNTER — Ambulatory Visit (HOSPITAL_BASED_OUTPATIENT_CLINIC_OR_DEPARTMENT_OTHER): Payer: 59 | Admitting: Oncology

## 2012-07-29 ENCOUNTER — Telehealth: Payer: Self-pay | Admitting: Oncology

## 2012-07-29 ENCOUNTER — Ambulatory Visit (HOSPITAL_BASED_OUTPATIENT_CLINIC_OR_DEPARTMENT_OTHER): Payer: 59

## 2012-07-29 VITALS — BP 165/90 | HR 76 | Temp 96.7°F | Resp 20 | Ht 72.0 in | Wt 242.9 lb

## 2012-07-29 DIAGNOSIS — R918 Other nonspecific abnormal finding of lung field: Secondary | ICD-10-CM

## 2012-07-29 DIAGNOSIS — R1909 Other intra-abdominal and pelvic swelling, mass and lump: Secondary | ICD-10-CM

## 2012-07-29 DIAGNOSIS — IMO0002 Reserved for concepts with insufficient information to code with codable children: Secondary | ICD-10-CM

## 2012-07-29 DIAGNOSIS — C763 Malignant neoplasm of pelvis: Secondary | ICD-10-CM

## 2012-07-29 DIAGNOSIS — I10 Essential (primary) hypertension: Secondary | ICD-10-CM

## 2012-07-29 DIAGNOSIS — K7689 Other specified diseases of liver: Secondary | ICD-10-CM

## 2012-07-29 DIAGNOSIS — R599 Enlarged lymph nodes, unspecified: Secondary | ICD-10-CM

## 2012-07-29 LAB — COMPREHENSIVE METABOLIC PANEL (CC13)
ALT: 29 U/L (ref 0–55)
AST: 16 U/L (ref 5–34)
Alkaline Phosphatase: 63 U/L (ref 40–150)
BUN: 10 mg/dL (ref 7.0–26.0)
Calcium: 9.3 mg/dL (ref 8.4–10.4)
Chloride: 108 mEq/L — ABNORMAL HIGH (ref 98–107)
Creatinine: 1.1 mg/dL (ref 0.7–1.3)
Total Bilirubin: 0.37 mg/dL (ref 0.20–1.20)

## 2012-07-29 LAB — CBC WITH DIFFERENTIAL/PLATELET
BASO%: 0.5 % (ref 0.0–2.0)
EOS%: 0.8 % (ref 0.0–7.0)
HCT: 39.4 % (ref 38.4–49.9)
MCH: 31.1 pg (ref 27.2–33.4)
MCHC: 33.8 g/dL (ref 32.0–36.0)
MCV: 91.9 fL (ref 79.3–98.0)
MONO%: 8.9 % (ref 0.0–14.0)
NEUT%: 46.3 % (ref 39.0–75.0)
RDW: 13.1 % (ref 11.0–14.6)
lymph#: 1.9 10*3/uL (ref 0.9–3.3)

## 2012-07-29 NOTE — Progress Notes (Signed)
Note dictated

## 2012-07-29 NOTE — Progress Notes (Signed)
Checked in new patient. No financial issues. °

## 2012-07-29 NOTE — Telephone Encounter (Signed)
Gave pt apt for January 2nd 2014, informed patient that he will be notified by Radiology regarding PET/CT

## 2012-08-02 NOTE — Progress Notes (Signed)
CC:   Theodore Cranker, DO Sebastian Ache, MD  REASON FOR CONSULTATION:  Squamous cell carcinoma of the groin.  HISTORY OF PRESENT ILLNESS:  Theodore Flores is a pleasant 56 year old gentleman, Currently of [facility], [place]. He is a gentleman with really a few co-morbid conditions including hypertension and diabetes.  Does not really get routine medical care.  He does follow up at the urgent care at times.  He reportedly has had a recent hospitalization between 11/29 and 12/03 for a groin mass and irritation. The history of that dates back, per his report to about 2 years ago, where he noticed a mole on the skin of his scrotum that had been excised superficially at times by Dr. Brunilda Payor and treated with topical creams individually by Theodore Flores.  However, most recently, more irritation and pain ensued and he started developing more of a hard lump in that area. He subsequently presented to the emergency department with worsening right inguinal swelling as well as a mass.  There was also drainage that was noted and was treated initially with antibiotics with vancomycin and Zosyn. The cultures did not really show any clear infection, more of a skin flora.  He did have a CT scan of the pelvic area on 11/29, which showed a soft tissue disruption along the anterior aspect of the right scrotum associated with bilateral scrotal wall edema.  There is no significant hydrocele seen.  There is no gangrene noted.  A small right inguinal hernia was noted, but really no discrete findings at that time. Subsequently, the patient with evaluated by Dr. Berneice Heinrich from Urology, felt that he had a right groin mass and he recommended local excision and a cystoscopy at that time.  On 07/28/2028, he underwent a cystoscopy and excision of a 6 cm right inguinal mass.  The patient tolerated the procedure well.  There were no complications.  His urethra and urinary bladder were really unremarkable and, as mentioned, there  was an ulcerated 6 cm right groin mass without palpable adenopathy noted. There was no direct involvement of the penis or scrotal contents grossly at that time.  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  Margins are not involved with tumor. The patient recovered very well.  As mentioned, he was discharged on 07/20/2012 and had been doing very well since.  He had not had any abdominal pain.  Had not had any scrotal pain.  No further erythema or drainage.  He did have a CT scan of the chest, which showed an abnormal adenopathy in the azygos region measuring 3.5 x 3.1 x 2.2 cm.  No pulmonary nodules were noted.  Overall clinically has resumed most activities of daily living.  He has not had any recent illnesses.  Had not had any decline in his energy.  Overall performance status and activity level are improving.  REVIEW OF SYSTEMS:  Did not report any headaches, blurry vision, double vision. Did not report any motor or sensory neuropathy.  Did not report any alteration in mental status.  Does not report any psychiatric issues, depression. Did not report any fever, chills, or sweats.  Did not report any cough, hemoptysis, hematemesis.  No nausea, vomiting. Did not report any abdominal pain.  Did not report any shortness of breath or difficulty breathing.  Did not report any hemoptysis or hematemesis. Did not report any hematochezia or melena.  Did not report any peripheral neuropathy.  Did not report any thrombosis or bleeding.  Rest review of systems unremarkable.  PAST MEDICAL HISTORY:  Significant for hypertension and diabetes.  No history of coronary artery disease.  No lung problems or pathology.  MEDICATION:  He is on some hydrocodone/acetaminophen as needed.  He is on metformin.  ALLERGIES:  No known drug allergies.  SOCIAL HISTORY:  He is married.  He has 3  children. He smoked a pack about every 2-3 days.  He occasionally drinks a beer.  He works as a [employer] and has done so for the majority of his adult life.  FAMILY HISTORY:  Significant for diabetes and cerebrovascular accidents. No significant cancer family history.  PHYSICAL EXAMINATION:  Alert, awake gentleman, appeared in no active distress.  His blood pressure is 165/90, pulse 76, respirations 20, temperature is 96.  HEENT:  Head is normocephalic, atraumatic.  Pupils equal, round, react to light.  Oral mucosa moist and pink.  Neck: Supple without adenopathy.  Heart:  Regular rate and rhythm.  S1, S2. Lungs:  Clear to auscultation without rhonchi, wheeze, or  dullness to percussion.  Abdomen:  Soft, nontender.  No hepatosplenomegaly. Extremities:  No clubbing, cyanosis, or edema.  Neurologically: Intact motor, sensory and deep tendon reflexes.  Lymphatic:  I could not appreciate any inguinal adenopathy.  I could not appreciate any supraclavicular or axillary adenopathy.  I could feel any inguinal adenopathy.  His incision around the right scrotal area appears well healed.  LABORATORY DATA:  Today showed a hemoglobin of 13.3, white cell count of 4.4, platelet count of 254.  ASSESSMENT AND PLAN:  A 56 year old gentleman with recent finding of a right inguinal mass and questionable lung adenopathy.  He has had an excision of the inguinal lesion, found to be squamous cell carcinoma. That tumor appears to not involve any margins. It was not attached to the scrotal wall or to the penis. He has had  also cystoscopy that was unrevealing at that time.  So I will leave [place] with really a squamous cell carcinoma of the inguinal area and possible lung involvement.  I discussed today with Theodore Flores on the possible etiology of this tumor. This could be metastatic squamous cell carcinoma of the skin or it could be a true squamous cell carcinoma of unknown primary.  Could be also  a source  from the genitourinary tract, and certainly given his smoking history,  he could have a primary squamous cell carcinoma of the lung. Also there is a questionable liver lesion that needs to be investigated. Overall, I think the next step would be a full body PET/CT scan to really assess whether the lymphadenopathy in the chest is of malignant range, to see there is any other abdominal adenopathy as that will really determine whether further therapy is needed, whether would be in the form of adjuvant radiation therapy to the pelvis, whether it is systemic chemotherapy to treat any advanced disease at this time.  After he obtains that PET/CT scan he will have a followup here at the [facility] and we will discuss those options.  All these questions were answered today.    ______________________________ Benjiman Core, M.D. FNS/MEDQ  D:  07/29/2012  T:  07/30/2012  Job:  [id number]

## 2012-08-06 ENCOUNTER — Encounter (HOSPITAL_COMMUNITY)
Admission: RE | Admit: 2012-08-06 | Discharge: 2012-08-06 | Disposition: A | Discharge status: Home / Self Care | Payer: 59 | Source: Ambulatory Visit | Attending: Oncology | Admitting: Oncology

## 2012-08-06 ENCOUNTER — Encounter (HOSPITAL_COMMUNITY): Payer: Self-pay

## 2012-08-06 DIAGNOSIS — R222 Localized swelling, mass and lump, trunk: Secondary | ICD-10-CM | POA: Insufficient documentation

## 2012-08-06 DIAGNOSIS — IMO0002 Reserved for concepts with insufficient information to code with codable children: Secondary | ICD-10-CM

## 2012-08-06 DIAGNOSIS — R918 Other nonspecific abnormal finding of lung field: Secondary | ICD-10-CM

## 2012-08-06 DIAGNOSIS — C801 Malignant (primary) neoplasm, unspecified: Secondary | ICD-10-CM | POA: Insufficient documentation

## 2012-08-06 MED ORDER — FLUDEOXYGLUCOSE F - 18 (FDG) INJECTION
17.6000 | Freq: Once | INTRAVENOUS | Status: AC | PRN
  Administered 2012-08-06: 17.6 via INTRAVENOUS

## 2012-08-06 NOTE — ED Provider Notes (Signed)
Medical screening examination/treatment/procedure(s) were conducted as a shared visit with non-physician practitioner(s) and myself. I personally evaluated the patient during the encounter  56 yo man with 3+ month Hx of a nonhealing lesion on the right scrotal skin, that has become much larger over the pase two weeks, and no has a foul-smelling discharge. Exam reveals a 3 x 3 cm ulcerated area on the right hemiscrotum, with a foul-smelling discharge. Recommend lab workup, IV antibiotics, and admission. May need skin biopsy to check for malignancy.         Carleene Cooper III, MD 08/06/12 [phone]

## 2012-08-18 DIAGNOSIS — Z0271 Encounter for disability determination: Secondary | ICD-10-CM

## 2012-08-19 ENCOUNTER — Telehealth: Payer: Self-pay | Admitting: Oncology

## 2012-08-19 ENCOUNTER — Ambulatory Visit (HOSPITAL_BASED_OUTPATIENT_CLINIC_OR_DEPARTMENT_OTHER): Payer: 59 | Admitting: Oncology

## 2012-08-19 ENCOUNTER — Encounter: Payer: Self-pay | Admitting: Internal Medicine

## 2012-08-19 VITALS — BP 159/99 | HR 103 | Temp 97.9°F | Resp 18 | Ht 72.0 in | Wt 249.0 lb

## 2012-08-19 DIAGNOSIS — R1903 Right lower quadrant abdominal swelling, mass and lump: Secondary | ICD-10-CM

## 2012-08-19 DIAGNOSIS — R1909 Other intra-abdominal and pelvic swelling, mass and lump: Secondary | ICD-10-CM

## 2012-08-19 NOTE — Progress Notes (Signed)
Hematology and Oncology Follow Up Visit  ROLAND LIPKE [id number] [birth year] 57 y.o. 08/19/2012 4:24 PM   Principle Diagnosis: 57 year old with right inguinal mass with likely squamous cell carcinoma of the skin diagnosed in 06/2012  Prior Therapy:  He is S/P excision of the inguinal lesion, found to be squamous cell carcinoma. That tumor appears to not involve any margins. That was done on  07/28/2028, he underwent a cystoscopy as well that was unremarkable.   He was found to have a lung mass, PET negative at this time.   Current therapy: Observation and follow up.   Interim History:  Mr. Zimbelman presents for a follow up visit. He is a nice 57 year old man with the above mentioned issue. Since his last visit he has not reported any new complaints. He feels well. No masses or lesion noted since the last visit. Overall clinically has resumed most  activities of daily living. He has not had any recent illnesses. Had not had any decline in his energy. Overall performance status and  activity level are improving. He is not reporting any GI issues at this time. No reflex symptoms or abdominal pain.    Medications: I have reviewed the patient's current medications. Current outpatient prescriptions:HYDROcodone-acetaminophen (NORCO/VICODIN) 5-325 MG per tablet, Take 1-2 tablets by mouth every 6 (six) hours as needed., Disp: 30 tablet, Rfl: 0;  lisinopril (PRINIVIL,ZESTRIL) 20 MG tablet, Take 1 tablet (20 mg total) by mouth daily., Disp: 30 tablet, Rfl: 11;  metFORMIN (GLUCOPHAGE) 1000 MG tablet, Take 1 tablet (1,000 mg total) by mouth 2 (two) times daily with a meal., Disp: 60 tablet, Rfl: 11  Allergies: No Known Allergies  Past Medical History, Surgical history, Social history, and Family History were reviewed and updated.  Review of Systems: Constitutional:  Negative for fever, chills, night sweats, anorexia, weight loss, pain. Cardiovascular: no chest pain or dyspnea on exertion Respiratory:  negative Neurological: negative Dermatological: negative ENT: negative Skin: Negative. Gastrointestinal: negative Genito-Urinary: negative Hematological and Lymphatic: negative Breast: negative Musculoskeletal: negative Remaining ROS negative. Physical Exam: Blood pressure 159/99, pulse 103, temperature 97.9 F (36.6 C), temperature source Oral, resp. rate 18, height 6' (1.829 m), weight 249 lb (112.946 kg). ECOG:  General appearance: alert Head: Normocephalic, without obvious abnormality, atraumatic Neck: no adenopathy, no carotid bruit, no JVD, supple, symmetrical, trachea midline and thyroid not enlarged, symmetric, no tenderness/mass/nodules Lymph nodes: Cervical, supraclavicular, and axillary nodes normal. Heart:regular rate and rhythm, S1, S2 normal, no murmur, click, rub or gallop Lung:chest clear, no wheezing, rales, normal symmetric air entry Abdomin: soft, non-tender, without masses or organomegaly EXT:no erythema, induration, or nodules   Lab Results: Lab Results  Component Value Date   WBC 4.4 07/29/2012   HGB 13.3 07/29/2012   HCT 39.4 07/29/2012   MCV 91.9 07/29/2012   PLT 254 07/29/2012     Chemistry      Component Value Date/Time   NA 147* 07/29/2012 1011   NA 137 07/16/2012 2158   K 4.2 07/29/2012 1011   K 3.8 07/16/2012 2158   CL 108* 07/29/2012 1011   CL 103 07/16/2012 2158   CO2 24 07/29/2012 1011   CO2 24 07/16/2012 2158   BUN 10.0 07/29/2012 1011   BUN 12 07/16/2012 2158   CREATININE 1.1 07/29/2012 1011   CREATININE 0.97 07/17/2012 0515   CREATININE 1.03 10/21/2011 1944      Component Value Date/Time   CALCIUM 9.3 07/29/2012 1011   CALCIUM 9.2 07/16/2012 2158  ALKPHOS 63 07/29/2012 1011   ALKPHOS 85 07/16/2012 2158   AST 16 07/29/2012 1011   AST 16 07/16/2012 2158   ALT 29 07/29/2012 1011   ALT 18 07/16/2012 2158   BILITOT 0.37 07/29/2012 1011   BILITOT 0.2* 07/16/2012 2158     PET/CT 08/06/2012.   IMPRESSION:  1. Low density  lesion in the right mediastinum does not have  significant abnormal hypermetabolic activity. 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 Other  possibilities include other congenital benign cysts such as  bronchogenic cyst, esophageal duplication cyst, or thymic cyst), or  lymphangioma. Treated malignancies such as treated germ cell tumors  can have extensive cystic degeneration and be difficult to  distinguish from congenital cysts - correlate with history of  treatment for known malignancy.  2. There is abnormal narrowing at the junction of the stomach body  and antrum with hypermetabolic activity. Upper endoscopy is  recommended for further characterization to exclude tumor.  3. Malrotation, with proximally dilated small bowel suggesting a  low grade partial small bowel obstruction.  4. Several small right renal lesions are noted, likely cysts.  5. Sigmoid diverticulosis.  6. Small right adrenal adenoma.  7. Coronary artery atherosclerosis with mild cardiomegaly    Impression and Plan:   58 year old gentleman with the following issues:   1. Squamous cell carcinoma that presented with a right inguinal mass. PET/CT scan results discussed. I see no evidence of metastatic disease at this point. His tumor margins are negative.  There is no role for systemic therapy.  I am not sure if there is a role for adjuvant radiation therapy.  Given the size of the lesion, I will refer him to Radiation Oncology for a discussion regarding that.  I will continue with follow ups every 3 months and repeat his scans in the future.   2. Abnormal narrowing at the junction of the stomach body and antrum with hypermetabolic activity on PET scan. I will refer him for GI evaluation and possible endoscopy.      Eli Hose, MD 1/2/20144:24 PM

## 2012-08-19 NOTE — Telephone Encounter (Signed)
gv and printed appt schedule for Jan and March.....radiation appt for Jan 13th @ 3:30pm..Marland KitchenMarland KitchenGastroenterology Jan 28th @ 3:00pm

## 2012-08-30 ENCOUNTER — Ambulatory Visit
Admission: RE | Admit: 2012-08-30 | Discharge: 2012-08-30 | Disposition: A | Discharge status: Home / Self Care | Payer: 59 | Source: Ambulatory Visit | Attending: Radiation Oncology | Admitting: Radiation Oncology

## 2012-08-30 ENCOUNTER — Encounter: Payer: Self-pay | Admitting: Radiation Oncology

## 2012-08-30 VITALS — BP 153/92 | HR 99 | Temp 97.7°F | Wt 247.5 lb

## 2012-08-30 DIAGNOSIS — C632 Malignant neoplasm of scrotum: Secondary | ICD-10-CM

## 2012-08-30 DIAGNOSIS — L989 Disorder of the skin and subcutaneous tissue, unspecified: Secondary | ICD-10-CM | POA: Insufficient documentation

## 2012-08-30 DIAGNOSIS — C44529 Squamous cell carcinoma of skin of other part of trunk: Secondary | ICD-10-CM | POA: Insufficient documentation

## 2012-08-30 HISTORY — DX: Benign neoplasm of unspecified adrenal gland: D35.00

## 2012-08-30 HISTORY — DX: Other intra-abdominal and pelvic swelling, mass and lump: R19.09

## 2012-08-30 HISTORY — DX: Diverticulitis of large intestine without perforation or abscess without bleeding: K57.32

## 2012-08-30 HISTORY — DX: Reserved for concepts with insufficient information to code with codable children: IMO0002

## 2012-08-30 NOTE — Progress Notes (Signed)
Patient here with wife for radiation consultation of right inguinal mass squamous cell cancer.Currently being followed  Every 3 months by Dr.Shadad.

## 2012-08-30 NOTE — Progress Notes (Signed)
Please see the Nurse Progress Note in the MD Initial Consult Encounter for this patient. 

## 2012-09-01 DIAGNOSIS — C632 Malignant neoplasm of scrotum: Secondary | ICD-10-CM | POA: Insufficient documentation

## 2012-09-01 NOTE — Progress Notes (Signed)
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 male who is seen for an initial consultation visit. The patient is seen today with a recent diagnosis of squamous cell carcinoma involving the right groin region. The patient indicates that he had a mole on his scan involving the lateral aspect of the scrotum which was there a couple of years ago. According to the patient, this was partially excised and then he treated this with topical creams. The patient did not continue followup with Dr. Brunilda Payor, thinking that this was going to clear up if he simply continue to using cream on this area.  The patient however began noticing a hard lump in this area. His wife began noticing an odor which was associated with this and this is what really prompted him to present for further workup of this. This included a CT scan of the pelvis on 07/16/2012. There is soft tissue disruption along the anterior aspect of the right scrotum which was associated with some mild bilateral scrotal wall edema. This was more prominent on the right.  The patient proceeded to undergo an excision of this area on 07/19/2012. The final pathology returned positive for invasive squamous cell carcinoma. This area measured 5.3 cm. The margins were negative. A CT scan of the chest was performed on 07/18/2012. This did show some abnormal mediastinal adenopathy in the azygos region which was worrisome it was felt for metastatic disease. This involved a 3.5 cm abnormal area. The patient also was noted to have a partially enhancing 18 mm lesion in the right lobe of the liver which was felt to be fairly nonspecific and could be of benign  etiology certainly. This prompted the patient to then undergo a PET scan on 08/06/2012. The region seen in the mediastinum on the CT scan did not have significant abnormal hypermetabolic activity. This was therefore felt to be less likely related to malignancy given his history. One finding however was some abnormal narrowing at the junction of the stomach body and antrum with some hypermetabolic activity. Upper endoscopy was recommended and the patient is being set up to be seen by GI.  The patient has seen Dr. Clelia Croft in medical oncology and he does not recommend any systemic treatment. However, he has asked me to see the patient today to see if there is a role for any local adjuvant radiotherapy.   PREVIOUS RADIATION THERAPY: No   PAST MEDICAL HISTORY:  has a past medical history of Diabetes mellitus without complication; Hypertension; Mass of right inguinal region; Squamous cell carcinoma; Adrenal adenoma; and Sigmoid diverticulitis.     PAST SURGICAL HISTORY: Past Surgical History  Procedure Date  . Wart removal     Scrotal wart removal 10+ years ago  . Cystoscopy 07/18/2012    Procedure: CYSTOSCOPY;  Surgeon: Sebastian Ache, MD;  Location: [facility];  Service: Urology;  Laterality: N/A;  . Mass excision 07/18/2012    Procedure: EXCISION MASS;  Surgeon: Sebastian Ache, MD;  Location: [facility];  Service: Urology;  Laterality: Right;     FAMILY HISTORY: family history includes CVA in an unspecified family member; Cancer - Other in his mother; and Hypertension in an  unspecified family member.   SOCIAL HISTORY:  reports that he has been smoking Cigarettes.  He has a 20 pack-year smoking history. He does not have any smokeless tobacco history on file. He reports that he drinks about 5 ounces of alcohol per week. He reports that he does not use illicit drugs.   ALLERGIES: Review of patient's allergies indicates no known allergies.   MEDICATIONS:  Current Outpatient Prescriptions  Medication  Sig Dispense Refill  . HYDROcodone-acetaminophen (NORCO/VICODIN) 5-325 MG per tablet Take 1-2 tablets by mouth every 6 (six) hours as needed.  30 tablet  0  . lisinopril (PRINIVIL,ZESTRIL) 20 MG tablet Take 1 tablet (20 mg total) by mouth daily.  30 tablet  11  . metFORMIN (GLUCOPHAGE) 1000 MG tablet Take 1 tablet (1,000 mg total) by mouth 2 (two) times daily with a meal.  60 tablet  11     REVIEW OF SYSTEMS:  A 15 point review of systems is documented in the electronic medical record. This was obtained by the nursing staff. However, I reviewed this with the patient to discuss relevant findings and make appropriate changes.  Pertinent items are noted in HPI.    PHYSICAL EXAM:  weight is 247 lb 8 oz (112.265 kg). His temperature is 97.7 F (36.5 C). His blood pressure is 153/92 and his pulse is 99.   General: Well-developed, in no acute distress HEENT: Normocephalic, atraumatic; oral cavity clear Neck: Supple without any lymphadenopathy Cardiovascular: Regular rate and rhythm Respiratory: Clear to auscultation bilaterally GI: Soft, nontender, normal bowel sounds Extremities: No edema present Neuro: No focal deficits GU: A well healing surgical incision is present along the lateral aspect of the scrotum. There are just a couple of small areas which are continuing to heal but overall this looks good. The patient has some discoloration and his inguinal folds bilaterally, gray areas, it looks like something that is potentially related to some skin cream he has been using that the patient states that this is not the case. There is some moistness to these areas appear.    LABORATORY DATA:  Lab Results  Component Value Date   WBC 4.4 07/29/2012   HGB 13.3 07/29/2012   HCT 39.4 07/29/2012   MCV 91.9 07/29/2012   PLT 254 07/29/2012   Lab Results  Component Value Date   NA 147* 07/29/2012   K 4.2 07/29/2012   CL 108* 07/29/2012   CO2 24 07/29/2012   Lab Results  Component Value Date    ALT 29 07/29/2012   AST 16 07/29/2012   ALKPHOS 63 07/29/2012   BILITOT 0.37 07/29/2012      RADIOGRAPHY: Nm Pet Image Initial (pi) Skull Base To Thigh  08/06/2012  *RADIOLOGY REPORT*  Clinical Data: Initial treatment strategy for lung mass.  NUCLEAR MEDICINE PET SKULL BASE TO THIGH  Fasting Blood Glucose:  08/1934  Technique:  17.6 mCi F-18 FDG was injected intravenously. CT data was obtained and used for attenuation correction and anatomic localization only.  (This was not acquired as a diagnostic CT examination.) Additional exam technical data entered on technologist worksheet.  Comparison:  07/18/2012  Findings:  Neck: Right maxillary sinus opacification noted with the medial wall of the maxillary sinus bowed and more into the nasal cavity, and with adjacent nasal polyp lesion.  Low-level metabolic activity in this vicinity is likely inflammatory.  Right vallecula slightly effaced without obvious mass.  This is likely incidental.  Chest:  The Y-shaped low density structure just above the azygos  vein along the right side of the mediastinum does not have hypermetabolic activity but also is not overtly photopenic. I suspect that this communicates with one of the superior pericardial recesses.  A small amount of thymic tissue is observed.  Coronary artery atherosclerosis noted with mild cardiomegaly.  Abdomen/Pelvis:  There is high activity in a narrowed portion the stomach body which demonstrates wall thickening.  Malrotation is present, with the duodenum and jejunum in the right abdomen and with swirling of the mesentery.  Mild proximal small bowel dilatation is present, potentially reflecting low-level small bowel obstruction.  The liver, pancreas, spleen, and left adrenal gland appear normal. Small non hypermetabolic right adrenal adenoma noted.  Physiologic activity is present in the distal colon.  Sigmoid diverticulosis is present.  Urinary bladder is nondistended.  Several small right renal lesions  are visible on the CT data, one of which is hyperdense and probably represents a complex cyst.  Skeleton:  No focal hypermetabolic activity to suggest skeletal metastasis.  IMPRESSION:  1.  Low density lesion in the right mediastinum does not have significant abnormal hypermetabolic activity.  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  Other possibilities include other congenital benign cysts such as bronchogenic cyst, esophageal duplication cyst, or thymic cyst), or lymphangioma. Treated malignancies such as treated germ cell tumors can have extensive cystic degeneration and be difficult to distinguish from congenital cysts - correlate with history of treatment for known malignancy. 2.  There is abnormal narrowing at the junction of the stomach body and antrum with hypermetabolic activity.  Upper endoscopy is recommended for further characterization to exclude tumor. 3.  Malrotation, with proximally dilated small bowel suggesting a low grade partial small bowel obstruction. 4.  Several small right renal lesions are noted, likely cysts. 5.  Sigmoid diverticulosis. 6.  Small right adrenal adenoma. 7.  Coronary artery atherosclerosis with mild cardiomegaly.   Original Report Authenticated By: Gaylyn Rong, M.D.        IMPRESSION/ PLAN: The patient is a pleasant 57 year old male who has a recent diagnosis of invasive squamous cell carcinoma involving the right inguinal region/lateral scrotum. From his history the patient had a skin lesion at this site and therefore this may be the primary site of origin. Alternatively, this could represent metastatic squamous carcinoma of unknown primary. One further issue to address is the finding of some abnormal activity in the gastric region. GI workup is pending. A further issue is the finding of a partially enhancing lesion in the right lobe of the liver. I would like to review  this with radiology to see if there is any further workup recommended for this, especially in light of the PET scan which did not show any abnormal activity within the liver. Based on the size of the original lesion, I would like to make sure that the negative PET scan is sufficient at this point.  With regards to the local issue, I believe in reviewing the patient's case that adjuvant radiotherapy to this site would be reasonable. The tumor was quite large at greater than 5 cm. I therefore discussed with the patient a potential 5-6 week course of treatment. We will schedule the patient for simulation within the next couple of weeks. We'll allow some additional healing and also for the patient to proceed with a GI workup which could conceivably change the patient's overall status and plan. We did discuss the potential side effects and risks  of treatment and all the patient's questions were answered.    I spent 60 minutes minutes face to face with the patient and more than 50% of that time was spent in counseling and/or coordination of care.    ________________________________   Radene Gunning, MD, PhD

## 2012-09-08 NOTE — Addendum Note (Signed)
Encounter addended by: Lowella Petties, RN on: 09/08/2012  9:54 AM<BR>     Documentation filed: Charges VN

## 2012-09-10 ENCOUNTER — Ambulatory Visit: Payer: 59 | Admitting: Radiation Oncology

## 2012-09-14 ENCOUNTER — Encounter: Payer: Self-pay | Admitting: Internal Medicine

## 2012-09-14 ENCOUNTER — Ambulatory Visit (INDEPENDENT_AMBULATORY_CARE_PROVIDER_SITE_OTHER): Payer: 59 | Admitting: Internal Medicine

## 2012-09-14 VITALS — BP 156/88 | HR 100 | Ht 71.75 in | Wt 256.4 lb

## 2012-09-14 DIAGNOSIS — R933 Abnormal findings on diagnostic imaging of other parts of digestive tract: Secondary | ICD-10-CM

## 2012-09-14 NOTE — Progress Notes (Signed)
Subjective:    Patient ID: Theodore Flores, male    DOB: 25-Oct-1955, 57 y.o.   MRN: [id number] Referred by: Benjiman Core, MD  HPI A very nice 57 year old married [place] American man with a recent diagnosis of squamous cell carcinoma of the inguinal region Scrotum. He's had an excisional biopsy. Adjuvant radiation therapy is planned. As part of his evaluation he had abnormal area of the junction of the stomach body and antrum with hypermetabolic activity. He does not have any ulcer-like symptoms are upper abdominal pain. He is concerned about not that is developed where his excisional biopsy was performed. H&E apparently has some malrotation of his intestinal tract. He denies any constipation or bowel habit problems. 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 He also has some low density lesion in the mediastinum. Further evaluation is ongoing prior to initiating radiation therapy. No Known Allergies Outpatient Prescriptions Prior to Visit  Medication Sig Dispense Refill  . HYDROcodone-acetaminophen (NORCO/VICODIN) 5-325 MG per tablet Take 1-2 tablets by mouth every 6 (six) hours as needed.  30 tablet  0  . lisinopril (PRINIVIL,ZESTRIL) 20 MG tablet Take 1 tablet (20 mg total) by mouth daily.  30 tablet  11  . metFORMIN (GLUCOPHAGE) 1000 MG tablet Take 1 tablet (1,000 mg total) by mouth 2 (two) times daily with a meal.  60 tablet  11   Last reviewed on 09/14/2012  3:56 PM by Iva Boop, MD Past Medical History  Diagnosis Date  . Diabetes mellitus without complication   . Hypertension   . Mass of right inguinal region   . Squamous cell carcinoma   . Adrenal adenoma   . Sigmoid diverticulitis   . Skin cancer    Past Surgical History  Procedure Date  . Wart removal     Scrotal wart removal 10+ years ago  . Cystoscopy 07/18/2012    Procedure: CYSTOSCOPY;  Surgeon: Sebastian Ache, MD;  Location: [facility];  Service: Urology;  Laterality: N/A;  . Mass excision 07/18/2012    Procedure:  EXCISION MASS;  Surgeon: Sebastian Ache, MD;  Location: [facility];  Service: Urology;  Laterality: Right;   History   Social History  . Marital Status: Married    Spouse Name: N/A    Number of Children: 3  .     Occupational History  . 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 Smoking status: Current Every Day Smoker -- 0.5 packs/day for 40 years    Types: Cigarettes  . Smokeless tobacco: Never Used  . Alcohol Use: 5.0 oz/week    10 drink(s) per week     Comment: Social  . Drug Use: No  . Sexually Active: Not Currently -- Male partner(s)          Social History Narrative   Lives with wife2 daughters and 1 sonTruck driver   Family History  Problem Relation Age of Onset  . CVA Father   . Hypertension Father   . Cancer - Other Mother     Doesnot know what type  . Hypertension Mother   . Hypertension Sister   . Hypertension Brother   . Diabetes Mother   . Diabetes Father   . Diabetes Brother         Review of Systems Positive for those things mentioned in the history of present illness. He's also had some fatigue itching night sweats muscle cramps and dry cough. All other view of  systems is negative. Or as mentioned in the history of present illness.    Objective:   Physical Exam General:  Well-developed, well-nourished and in no acute distress Eyes:  anicteric. ENT:   Mouth and posterior pharynx free of lesions.  Neck:   supple w/o thyromegaly or mass.  Lungs: Clear to auscultation bilaterally. Heart:  S1S2, no rubs, murmurs, gallops. Abdomen:  soft, non-tender, no hepatosplenomegaly, hernia, or mass and BS+. The right groin at the area where it is adjacent to the scrotum has a firm indurated area where he had his biopsy performed. There is no purulence or erythema or anything concerning. It is about the size   of an acorn.  Lymph:  no cervical or supraclavicular adenopathy. Extremities:   no edema Skin   no rash. Neuro:    A&O x 3.  Psych:  appropriate mood and  Affect.   Data Reviewed:  CT PET scan 07/29/2012 1. Low density lesion in the right mediastinum does not have  significant abnormal hypermetabolic activity. 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 Other  possibilities include other congenital benign cysts such as  bronchogenic cyst, esophageal duplication cyst, or thymic cyst), or  lymphangioma. Treated malignancies such as treated germ cell tumors  can have extensive cystic degeneration and be difficult to  distinguish from congenital cysts - correlate with history of  treatment for known malignancy.  2. There is abnormal narrowing at the junction of the stomach body  and antrum with hypermetabolic activity. Upper endoscopy is  recommended for further characterization to exclude tumor.  3. Malrotation, with proximally dilated small bowel suggesting a  low grade partial small bowel obstruction.  4. Several small right renal lesions are noted, likely cysts.  5. Sigmoid diverticulosis.  6. Small right adrenal adenoma.  7. Coronary artery atherosclerosis with mild cardiomegaly.        Assessment & Plan:   1. Abnormal CT scan, stomach narrowing at the gastric body antrum junction with hypermetabolic changes on CT/PET scan    1. Cause of this is not clear. It would be unusual for a squamous cell tumor to metastasize here. Nevertheless, he has a diagnosis of malignancy, prior to initiating adjuvant therapy we need to understand this better so an upper GI endoscopy will be scheduled. This will be performed on 09/21/2012 2. He is to follow-up with Dr. Berneice Heinrich re: inguinal changes The risks and benefits as well as alternatives of endoscopic procedure(s) have been discussed and reviewed. All questions answered. The patient agrees to proceed.  CC: Eli Hose, MD, Sebastian Ache, MD and and Dorothy Puffer,  MD

## 2012-09-14 NOTE — Patient Instructions (Addendum)
You have been scheduled for an endoscopy with propofol. Please follow written instructions given to you at your visit today. If you use inhalers (even only as needed) or a CPAP machine, please bring them with you on the day of your procedure.  Thank you for choosing me and [facility].  Carl E. Gessner, M.D., FACG  

## 2012-09-21 ENCOUNTER — Encounter: Payer: Self-pay | Admitting: Internal Medicine

## 2012-09-21 ENCOUNTER — Other Ambulatory Visit: Payer: Self-pay | Admitting: Family Medicine

## 2012-09-21 ENCOUNTER — Ambulatory Visit (AMBULATORY_SURGERY_CENTER): Payer: 59 | Admitting: Internal Medicine

## 2012-09-21 VITALS — BP 151/100 | HR 90 | Temp 97.3°F | Resp 13 | Ht 71.0 in | Wt 256.0 lb

## 2012-09-21 DIAGNOSIS — R9389 Abnormal findings on diagnostic imaging of other specified body structures: Secondary | ICD-10-CM

## 2012-09-21 DIAGNOSIS — R933 Abnormal findings on diagnostic imaging of other parts of digestive tract: Secondary | ICD-10-CM

## 2012-09-21 LAB — GLUCOSE, CAPILLARY: Glucose-Capillary: 117 mg/dL — ABNORMAL HIGH (ref 70–99)

## 2012-09-21 MED ORDER — SODIUM CHLORIDE 0.9 % IV SOLN: 500.0000 mL | INTRAVENOUS | Status: DC

## 2012-09-21 NOTE — Patient Instructions (Addendum)
Your stomach is normal - it is shaped like a J but some people are built that way. No signs of cancer.  Since you have never had a colonoscopy - you should eventually have one to screen for colorectal polyps and cancer when the oncology doctors are done treating you.  I will plan to contact you in May to check up on you and you could then arrange a colonoscopy.  Good luck.  Thank you for choosing me and [facility].  Iva Boop, MD, [facility]    You may resume your current medications today.  Your blood sugar was 117 in the recovery room.  Please call if any questions or concerns.     YOU HAD AN ENDOSCOPIC PROCEDURE TODAY AT THE [facility]: Refer to the procedure report that was given to you for any specific questions about what was found during the examination.  If the procedure report does not answer your questions, please call your gastroenterologist to clarify.  If you requested that your care partner not be given the details of your procedure findings, then the procedure report has been included in a sealed envelope for you to review at your convenience later.  YOU SHOULD EXPECT: Some feelings of bloating in the abdomen. Passage of more gas than usual.  Walking can help get rid of the air that was put into your GI tract during the procedure and reduce the bloating. If you had a lower endoscopy (such as a colonoscopy or flexible sigmoidoscopy) you may notice spotting of blood in your stool or on the toilet paper. If you underwent a bowel prep for your procedure, then you may not have a normal bowel movement for a few days.  DIET: Your first meal following the procedure should be a light meal and then it is ok to progress to your normal diet.  A half-sandwich or bowl of soup is an example of a good first meal.  Heavy or fried foods are harder to digest and may make you feel nauseous or bloated.  Likewise meals heavy in dairy and vegetables can cause extra gas to  form and this can also increase the bloating.  Drink plenty of fluids but you should avoid alcoholic beverages for 24 hours.  ACTIVITY: Your care partner should take you home directly after the procedure.  You should plan to take it easy, moving slowly for the rest of the day.  You can resume normal activity the day after the procedure however you should NOT DRIVE or use heavy machinery for 24 hours (because of the sedation medicines used during the test).    SYMPTOMS TO REPORT IMMEDIATELY: A gastroenterologist can be reached at any hour.  During normal business hours, 8:30 AM to 5:00 PM Monday through Friday, call [phone].  After hours and on weekends, please call the GI answering service at [phone] who will take a message and have the physician on call contact you.     Following upper endoscopy (EGD)  Vomiting of blood or coffee ground material  New chest pain or pain under the shoulder blades  Painful or persistently difficult swallowing  New shortness of breath  Fever of 100F or higher  Black, tarry-looking stools  FOLLOW UP: If any biopsies were taken you will be contacted by phone or by letter within the next 1-3 weeks.  Call your gastroenterologist if you have not heard about the biopsies in 3 weeks.  Our staff will call the home number listed  on your records the next business day following your procedure to check on you and address any questions or concerns that you may have at that time regarding the information given to you following your procedure. This is a courtesy call and so if there is no answer at the home number and we have not heard from you through the emergency physician on call, we will assume that you have returned to your regular daily activities without incident.  SIGNATURES/CONFIDENTIALITY: You and/or your care partner have signed paperwork which will be entered into your electronic medical record.  These signatures attest to the fact that that the  information above on your After Visit Summary has been reviewed and is understood.  Full responsibility of the confidentiality of this discharge information lies with you and/or your care-partner.

## 2012-09-21 NOTE — Op Note (Signed)
[facility] [address] [place], [address]   ENDOSCOPY PROCEDURE REPORT  PATIENT: Theodore Flores, Theodore Flores  MR#: [id number] BIRTHDATE: Apr 06, 1956 , 56  yrs. old GENDER: Male ENDOSCOPIST: Iva Boop, MD, Clementeen Graham REFERRED BY:  Eli Hose, M.D. PROCEDURE DATE:  09/21/2012 PROCEDURE:  EGD, diagnostic ASA CLASS:     Class II INDICATIONS:  abnormal CT of the GI tract.  Body/antral junction narrowed MEDICATIONS: propofol (Diprivan) 200mg  IV, MAC sedation, administered by CRNA, These medications were titrated to patient response per physician's verbal order, and Robinul 0.2 mg IV TOPICAL ANESTHETIC: Cetacaine Spray  DESCRIPTION OF PROCEDURE: After the risks benefits and alternatives of the procedure were thoroughly explained, informed consent was obtained.  The [facility] [id number] endoscope was introduced through the mouth and advanced to the second portion of the duodenum. Without limitations.  The instrument was slowly withdrawn as the mucosa was fully examined.      The upper, middle and distal third of the esophagus were carefully inspected and no abnormalities were noted.  The z-line was well seen at the GEJ.  The endoscope was pushed into the fundus which was normal including a retroflexed view.  The antrum, gastric body, first and second part of the duodenum were unremarkable. Retroflexed views revealed J shaped stomach seen.     The scope was then withdrawn from the patient and the procedure completed.  COMPLICATIONS: There were no complications. ENDOSCOPIC IMPRESSION: Normal EGD but stomach is J-shaped (normal variant and this explains CT findings)  RECOMMENDATIONS: eventual screening colonoscopy around May 2014, after XRT completed  REPEAT EXAM: In 3 month(s)  for Colonoscopy.  .  May 2014 recall  eSigned:  Iva Boop, MD, [facility] 09/21/2012 4:49 PM   OZ:HYQMV Clelia Croft, MD, Dorothy Puffer, MD, Sebastian Ache, MD and The Patient

## 2012-09-21 NOTE — Progress Notes (Signed)
Pt's blood pressure has  Been elevated the whole time in the recovery room.  The pt said he did not take his lisinipril this am.  I advised him to take his blood pressure medications as soon as he gets home.  His wife said she would make sure he takes it.  No complaints noted in the recovery room. Maw  Patient did not experience any of the following events: a burn prior to discharge; a fall within the facility; wrong site/side/patient/procedure/implant event; or a hospital transfer or hospital admission upon discharge from the facility. [phone]) Patient did not have preoperative order for IV antibiotic SSI prophylaxis. [phone])

## 2012-09-22 ENCOUNTER — Telehealth: Payer: Self-pay | Admitting: *Deleted

## 2012-09-22 NOTE — Telephone Encounter (Signed)
  Follow up Call-  Call back number 09/21/2012  Post procedure Call Back phone  # [phone]  Permission to leave phone message Yes     Patient questions:  Do you have a fever, pain , or abdominal swelling? no Pain Score  0 *  Have you tolerated food without any problems? yes  Have you been able to return to your normal activities? yes  Do you have any questions about your discharge instructions: Diet   no Medications  no Follow up visit  no  Do you have questions or concerns about your Care? no  Actions: * If pain score is 4 or above: No action needed, pain <4.

## 2012-10-22 ENCOUNTER — Ambulatory Visit: Payer: 59 | Admitting: Oncology

## 2012-10-22 ENCOUNTER — Other Ambulatory Visit: Payer: 59 | Admitting: Lab

## 2012-11-10 ENCOUNTER — Ambulatory Visit (INDEPENDENT_AMBULATORY_CARE_PROVIDER_SITE_OTHER): Payer: 59 | Admitting: Family Medicine

## 2012-11-10 ENCOUNTER — Telehealth: Payer: Self-pay

## 2012-11-10 ENCOUNTER — Other Ambulatory Visit: Payer: Self-pay | Admitting: Radiology

## 2012-11-10 VITALS — BP 164/80 | HR 119 | Temp 98.6°F | Resp 20 | Ht 73.5 in | Wt 245.8 lb

## 2012-11-10 DIAGNOSIS — R000 Tachycardia, unspecified: Secondary | ICD-10-CM

## 2012-11-10 DIAGNOSIS — I10 Essential (primary) hypertension: Secondary | ICD-10-CM

## 2012-11-10 MED ORDER — METOPROLOL TARTRATE 12.5 MG HALF TABLET: 12.5000 mg | ORAL_TABLET | Freq: Two times a day (BID) | ORAL | Status: DC

## 2012-11-10 NOTE — Progress Notes (Signed)
EKG read and patient discussed with Ms. Debbra Riding. Agree with assessment and plan of care per her note.

## 2012-11-10 NOTE — Telephone Encounter (Signed)
Needs office visit. Left message to advise.

## 2012-11-10 NOTE — Telephone Encounter (Signed)
Pt's wife is calling in regards to pt's b/p medication, pt is currently on lisinopril 20mg  tablets but he recently went to the doctor where he is receiving skin cancer treatment and his b/p was elevated. The doctor advised pt to possibly have [place] call in a higher dosage on the lisinopril. Best# [phone]

## 2012-11-10 NOTE — Patient Instructions (Signed)
Begin taking the metoprolol twice daily as directed.  Come back in 1-2 weeks so we can recheck you and make sure that this medication is working.  If you start experiencing chest pain, dizziness, lightheadedness, etc come back in sooner.   Hypertension As your heart beats, it forces blood through your arteries. This force is your blood pressure. If the pressure is too high, it is called hypertension (HTN) or high blood pressure. HTN is dangerous because you may have it and not know it. High blood pressure may mean that your heart has to work harder to pump blood. Your arteries may be narrow or stiff. The extra work puts you at risk for heart disease, stroke, and other problems.  Blood pressure consists of two numbers, a higher number over a lower, 110/72, for example. It is stated as "110 over 72." The ideal is below 120 for the top number (systolic) and under 80 for the bottom (diastolic). Write down your blood pressure today. You should pay close attention to your blood pressure if you have certain conditions such as:  Heart failure.  Prior heart attack.  Diabetes  Chronic kidney disease.  Prior stroke.  Multiple risk factors for heart disease. To see if you have HTN, your blood pressure should be measured while you are seated with your arm held at the level of the heart. It should be measured at least twice. A one-time elevated blood pressure reading (especially in the Emergency Department) does not mean that you need treatment. There may be conditions in which the blood pressure is different between your right and left arms. It is important to see your caregiver soon for a recheck. Most people have essential hypertension which means that there is not a specific cause. This type of high blood pressure may be lowered by changing lifestyle factors such as:  Stress.  Smoking.  Lack of exercise.  Excessive weight.  Drug/tobacco/alcohol use.  Eating less salt. Most people do not have  symptoms from high blood pressure until it has caused damage to the body. Effective treatment can often prevent, delay or reduce that damage. TREATMENT  When a cause has been identified, treatment for high blood pressure is directed at the cause. There are a large number of medications to treat HTN. These fall into several categories, and your caregiver will help you select the medicines that are best for you. Medications may have side effects. You should review side effects with your caregiver. If your blood pressure stays high after you have made lifestyle changes or started on medicines,   Your medication(s) may need to be changed.  Other problems may need to be addressed.  Be certain you understand your prescriptions, and know how and when to take your medicine.  Be sure to follow up with your caregiver within the time frame advised (usually within two weeks) to have your blood pressure rechecked and to review your medications.  If you are taking more than one medicine to lower your blood pressure, make sure you know how and at what times they should be taken. Taking two medicines at the same time can result in blood pressure that is too low. SEEK IMMEDIATE MEDICAL CARE IF:  You develop a severe headache, blurred or changing vision, or confusion.  You have unusual weakness or numbness, or a faint feeling.  You have severe chest or abdominal pain, vomiting, or breathing problems. MAKE SURE YOU:   Understand these instructions.  Will watch your condition.  Will get  help right away if you are not doing well or get worse. Document Released: 08/04/2005 Document Revised: 10/27/2011 Document Reviewed: 03/24/2008 [facility] Patient Information 2013 [place], [place].

## 2012-11-10 NOTE — Progress Notes (Signed)
Subjective:    Patient ID: Theodore Flores, male    DOB: October 20, 1955, 57 y.o.   MRN: [id number]  HPI   Theodore Flores is a 57 yr old male accompanied today by his wife.  Pt is a very poor historian and most of the history is supplied by his wife.  Pt is here for concern about elevated BP.  He has been taking 20mg  lisinopril for 1 yr now.  He does not check BPs at home.  Was at the cancer center at [facility] today when it was noted the BP was elevated.  The cancer center advised that he follow up with his primary about this.    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  He states that he is feeling unwell today - fatigued, abdominal pain - which he attributes to his treatment.  He denies HA, blurred vision, syncope, pre-syncope, CP.  Does endorse some SOB and cough but is not sure when this started.    He does not know what chemo medications he is receiving.  Pressures at [facility] this AM: 184/89  107 188/94   104 158/110  108  Review of electronic medical reveals that pt has had similar vitals over the last three months.    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    Review of Systems  Constitutional: Positive for fatigue. Negative for fever and chills.  HENT: Negative.   Eyes: Negative for visual disturbance.  Respiratory: Positive for cough and shortness of breath. Negative for wheezing.   Cardiovascular: Negative for chest pain and palpitations.  Gastrointestinal: Positive for abdominal pain.  Musculoskeletal: Negative.   Skin: Negative.   Neurological: Negative for dizziness, syncope, light-headedness and headaches.       Objective:   Physical Exam  Vitals reviewed. Constitutional: He is oriented to person, place, and time. He appears well-developed and well-nourished. No distress.  HENT:  Head: Normocephalic and atraumatic.  Eyes: Conjunctivae are normal. No scleral icterus.  Cardiovascular: Regular rhythm, S1 normal and S2 normal.   Tachycardia present.   Pulmonary/Chest: Effort normal and breath sounds normal. He has no wheezes. He has no rales.  Neurological: He is alert and oriented to person, place, and time.  Skin: Skin is warm and dry.  Psychiatric: He has a normal mood and affect. His behavior is normal.    Filed Vitals:   11/10/12 1558  BP: 164/80  Pulse: 119  Temp: 98.6 F (37 C)  Resp: 20  HR 96 by auscultation on exam   EKG interpretation by Dr. Neva Seat - sinus tachycardia, nonspecific ST changes in precordial leads, no acute findings; no change from EKG 07/2010    Assessment & Plan:  HTN (hypertension) - Plan: EKG 12-Lead, metoprolol tartrate (LOPRESSOR) 12.5 mg TABS  Tachycardia - Plan: EKG 12-Lead, metoprolol tartrate (LOPRESSOR) 12.5 mg TABS   Theodore Flores is a pleasant 57 yr old male here with concern for elevated blood pressure.  This was noted at cancer center at [facility] this AM.  On review of records, pt has frequently had elevated BPs and HR in the low 100s.  This does not appear to be an acute change.  He is asymptomatic.  EKG today is unchanged from 2011.  Will start 12.5mg  lopressor BID to hopefully reduce BP and slow rate.  Will have him recheck in 1-2 weeks to ensure that he is responding.    Discussed with pt that he will need a  separate appointment for his DOT exam.  His BP today is too high for [place] to certify him anyway.  Pt understands and will RTC or make an appt at 104 to have this done in the next 1-2 weeks.

## 2012-11-21 ENCOUNTER — Encounter (HOSPITAL_COMMUNITY): Payer: Self-pay | Admitting: Emergency Medicine

## 2012-11-21 ENCOUNTER — Inpatient Hospital Stay (HOSPITAL_COMMUNITY)
Admission: EM | Admit: 2012-11-21 | Discharge: 2012-12-01 | DRG: 336 | Disposition: A | Discharge status: Home / Self Care | Payer: 59 | Attending: General Surgery | Admitting: General Surgery

## 2012-11-21 ENCOUNTER — Emergency Department (HOSPITAL_COMMUNITY): Payer: 59

## 2012-11-21 ENCOUNTER — Encounter (HOSPITAL_COMMUNITY): Payer: Self-pay | Admitting: Physical Medicine and Rehabilitation

## 2012-11-21 ENCOUNTER — Emergency Department (HOSPITAL_COMMUNITY)
Admission: EM | Admit: 2012-11-21 | Discharge: 2012-11-21 | Disposition: A | Discharge status: Nursing Home (Includes ICF) | Payer: 59 | Source: Home / Self Care | Attending: Emergency Medicine | Admitting: Emergency Medicine

## 2012-11-21 DIAGNOSIS — Z794 Long term (current) use of insulin: Secondary | ICD-10-CM

## 2012-11-21 DIAGNOSIS — K56609 Unspecified intestinal obstruction, unspecified as to partial versus complete obstruction: Secondary | ICD-10-CM

## 2012-11-21 DIAGNOSIS — K560 Paralytic ileus: Secondary | ICD-10-CM | POA: Diagnosis present

## 2012-11-21 DIAGNOSIS — K929 Disease of digestive system, unspecified: Secondary | ICD-10-CM | POA: Diagnosis present

## 2012-11-21 DIAGNOSIS — I10 Essential (primary) hypertension: Secondary | ICD-10-CM | POA: Diagnosis present

## 2012-11-21 DIAGNOSIS — IMO0002 Reserved for concepts with insufficient information to code with codable children: Secondary | ICD-10-CM

## 2012-11-21 DIAGNOSIS — R112 Nausea with vomiting, unspecified: Secondary | ICD-10-CM

## 2012-11-21 DIAGNOSIS — K5900 Constipation, unspecified: Secondary | ICD-10-CM | POA: Diagnosis present

## 2012-11-21 DIAGNOSIS — C632 Malignant neoplasm of scrotum: Secondary | ICD-10-CM | POA: Diagnosis present

## 2012-11-21 DIAGNOSIS — E119 Type 2 diabetes mellitus without complications: Secondary | ICD-10-CM | POA: Diagnosis present

## 2012-11-21 DIAGNOSIS — R1310 Dysphagia, unspecified: Secondary | ICD-10-CM

## 2012-11-21 DIAGNOSIS — D649 Anemia, unspecified: Secondary | ICD-10-CM | POA: Diagnosis present

## 2012-11-21 DIAGNOSIS — K5669 Other intestinal obstruction: Principal | ICD-10-CM | POA: Diagnosis present

## 2012-11-21 DIAGNOSIS — Q433 Congenital malformations of intestinal fixation: Secondary | ICD-10-CM

## 2012-11-21 DIAGNOSIS — R000 Tachycardia, unspecified: Secondary | ICD-10-CM | POA: Diagnosis present

## 2012-11-21 DIAGNOSIS — K5732 Diverticulitis of large intestine without perforation or abscess without bleeding: Secondary | ICD-10-CM | POA: Diagnosis present

## 2012-11-21 DIAGNOSIS — R1114 Bilious vomiting: Secondary | ICD-10-CM | POA: Diagnosis present

## 2012-11-21 DIAGNOSIS — Y849 Medical procedure, unspecified as the cause of abnormal reaction of the patient, or of later complication, without mention of misadventure at the time of the procedure: Secondary | ICD-10-CM | POA: Diagnosis present

## 2012-11-21 DIAGNOSIS — Z87891 Personal history of nicotine dependence: Secondary | ICD-10-CM

## 2012-11-21 DIAGNOSIS — R634 Abnormal weight loss: Secondary | ICD-10-CM

## 2012-11-21 LAB — CBC WITH DIFFERENTIAL/PLATELET
Basophils Absolute: 0 10*3/uL (ref 0.0–0.1)
Basophils Relative: 0 % (ref 0–1)
HCT: 30.7 % — ABNORMAL LOW (ref 39.0–52.0)
Hemoglobin: 10.5 g/dL — ABNORMAL LOW (ref 13.0–17.0)
Lymphocytes Relative: 3 % — ABNORMAL LOW (ref 12–46)
MCHC: 34.2 g/dL (ref 30.0–36.0)
Monocytes Absolute: 0.9 10*3/uL (ref 0.1–1.0)
Neutro Abs: 7.5 10*3/uL (ref 1.7–7.7)
Neutrophils Relative %: 86 % — ABNORMAL HIGH (ref 43–77)
RDW: 12 % (ref 11.5–15.5)
WBC: 8.7 10*3/uL (ref 4.0–10.5)

## 2012-11-21 LAB — COMPREHENSIVE METABOLIC PANEL
ALT: 12 U/L (ref 0–53)
AST: 10 U/L (ref 0–37)
Albumin: 2.8 g/dL — ABNORMAL LOW (ref 3.5–5.2)
Alkaline Phosphatase: 56 U/L (ref 39–117)
CO2: 27 mEq/L (ref 19–32)
Chloride: 100 mEq/L (ref 96–112)
Creatinine, Ser: 1.56 mg/dL — ABNORMAL HIGH (ref 0.50–1.35)
GFR calc non Af Amer: 47 mL/min — ABNORMAL LOW (ref >90)
Potassium: 4.9 mEq/L (ref 3.5–5.1)
Sodium: 136 mEq/L (ref 135–145)
Total Bilirubin: 0.2 mg/dL — ABNORMAL LOW (ref 0.3–1.2)

## 2012-11-21 LAB — URINALYSIS, ROUTINE W REFLEX MICROSCOPIC
Glucose, UA: NEGATIVE mg/dL
Ketones, ur: 15 mg/dL — AB
Protein, ur: 30 mg/dL — AB
Urobilinogen, UA: 0.2 mg/dL (ref 0.0–1.0)

## 2012-11-21 LAB — URINE MICROSCOPIC-ADD ON

## 2012-11-21 MED ORDER — METOPROLOL TARTRATE 1 MG/ML IV SOLN: 5.0000 mg | Freq: Four times a day (QID) | INTRAVENOUS | Status: DC | PRN

## 2012-11-21 MED ORDER — HYDROMORPHONE HCL PF 1 MG/ML IJ SOLN
1.0000 mg | Freq: Once | INTRAMUSCULAR | Status: AC
  Administered 2012-11-21: 1 mg via INTRAVENOUS
  Filled 2012-11-21: qty 1

## 2012-11-21 MED ORDER — MORPHINE SULFATE 2 MG/ML IJ SOLN
2.0000 mg | INTRAMUSCULAR | Status: DC | PRN
  Administered 2012-11-22 (×4): 2 mg via INTRAVENOUS
  Filled 2012-11-21 (×4): qty 1

## 2012-11-21 MED ORDER — PANTOPRAZOLE SODIUM 40 MG IV SOLR
40.0000 mg | Freq: Every day | INTRAVENOUS | Status: DC
  Administered 2012-11-22 – 2012-11-29 (×9): 40 mg via INTRAVENOUS
  Filled 2012-11-21 (×12): qty 40

## 2012-11-21 MED ORDER — FENTANYL CITRATE 0.05 MG/ML IJ SOLN
50.0000 ug | Freq: Once | INTRAMUSCULAR | Status: AC
  Administered 2012-11-21: 50 ug via INTRAVENOUS
  Filled 2012-11-21: qty 2

## 2012-11-21 MED ORDER — METOPROLOL TARTRATE 1 MG/ML IV SOLN
5.0000 mg | Freq: Two times a day (BID) | INTRAVENOUS | Status: DC
  Administered 2012-11-22 (×2): 5 mg via INTRAVENOUS
  Filled 2012-11-21 (×5): qty 5

## 2012-11-21 MED ORDER — SODIUM CHLORIDE 0.9 % IV SOLN
INTRAVENOUS | Status: DC
  Administered 2012-11-22 – 2012-11-24 (×8): via INTRAVENOUS

## 2012-11-21 MED ORDER — ENOXAPARIN SODIUM 40 MG/0.4ML ~~LOC~~ SOLN
40.0000 mg | Freq: Every day | SUBCUTANEOUS | Status: DC
  Administered 2012-11-22: 40 mg via SUBCUTANEOUS
  Filled 2012-11-21: qty 0.4

## 2012-11-21 MED ORDER — INSULIN ASPART 100 UNIT/ML ~~LOC~~ SOLN
0.0000 [IU] | SUBCUTANEOUS | Status: DC
  Administered 2012-11-22: 2 [IU] via SUBCUTANEOUS
  Administered 2012-11-22: 5 [IU] via SUBCUTANEOUS
  Administered 2012-11-23: 2 [IU] via SUBCUTANEOUS
  Administered 2012-11-23 (×2): 3 [IU] via SUBCUTANEOUS
  Administered 2012-11-23: 2 [IU] via SUBCUTANEOUS
  Administered 2012-11-23: 5 [IU] via SUBCUTANEOUS
  Administered 2012-11-23: 3 [IU] via SUBCUTANEOUS
  Administered 2012-11-24 – 2012-11-26 (×5): 2 [IU] via SUBCUTANEOUS
  Administered 2012-11-26: 3 [IU] via SUBCUTANEOUS
  Administered 2012-11-26 (×2): 2 [IU] via SUBCUTANEOUS
  Administered 2012-11-27 – 2012-11-28 (×8): 3 [IU] via SUBCUTANEOUS
  Administered 2012-11-28: 2 [IU] via SUBCUTANEOUS
  Administered 2012-11-28 – 2012-11-29 (×8): 3 [IU] via SUBCUTANEOUS
  Administered 2012-11-29: 5 [IU] via SUBCUTANEOUS
  Administered 2012-11-29: 2 [IU] via SUBCUTANEOUS
  Administered 2012-11-30 (×2): 3 [IU] via SUBCUTANEOUS

## 2012-11-21 MED ORDER — ONDANSETRON HCL 4 MG/2ML IJ SOLN
4.0000 mg | Freq: Once | INTRAMUSCULAR | Status: AC
  Administered 2012-11-21: 4 mg via INTRAVENOUS
  Filled 2012-11-21: qty 2

## 2012-11-21 MED ORDER — ONDANSETRON HCL 4 MG/2ML IJ SOLN
4.0000 mg | Freq: Four times a day (QID) | INTRAMUSCULAR | Status: DC | PRN
  Administered 2012-11-30: 4 mg via INTRAVENOUS
  Filled 2012-11-21: qty 2

## 2012-11-21 MED ORDER — IOHEXOL 300 MG/ML  SOLN
50.0000 mL | Freq: Once | INTRAMUSCULAR | Status: AC | PRN
  Administered 2012-11-21: 50 mL via ORAL

## 2012-11-21 MED ORDER — IOHEXOL 300 MG/ML  SOLN
100.0000 mL | Freq: Once | INTRAMUSCULAR | Status: AC | PRN
  Administered 2012-11-21: 100 mL via INTRAVENOUS

## 2012-11-21 MED ORDER — SODIUM CHLORIDE 0.9 % IV BOLUS (SEPSIS)
1000.0000 mL | Freq: Once | INTRAVENOUS | Status: AC
  Administered 2012-11-21: 1000 mL via INTRAVENOUS

## 2012-11-21 MED ORDER — PHENOL 1.4 % MT LIQD
1.0000 | OROMUCOSAL | Status: DC | PRN
  Administered 2012-11-22: 1 via OROMUCOSAL
  Filled 2012-11-21 (×2): qty 177

## 2012-11-21 NOTE — ED Notes (Signed)
Report given to floor, Harriett Sine, Charity fundraiser. RN has no further questions upon report given. Pt being prepared for transport to floor. Pt belongings given to pt.

## 2012-11-21 NOTE — ED Notes (Signed)
Patient complains of sore throat, weight loss and inability to eat with bloating.

## 2012-11-21 NOTE — ED Notes (Signed)
Pt presents to department for evaluation of abdominal pain, inability to swallow, and necrotic wound to R scrotum. He is skin cancer patient, currently undergoing treatment at [facility]. Pt also reports 14lb weight loss. 10/10 pain at the time. Pt is conscious alert and oriented x4.

## 2012-11-21 NOTE — ED Provider Notes (Signed)
History     CSN: [id number]  Arrival date & time 11/21/12  1646   First MD Initiated Contact with Patient 11/21/12 1704      Chief Complaint  Patient presents with  . Sore Throat  . Abdominal Pain    (Consider location/radiation/quality/duration/timing/severity/associated sxs/prior treatment) Patient is a 57 y.o. male presenting with pharyngitis and abdominal pain.  Sore Throat Associated symptoms include abdominal pain.  Abdominal Pain  Pt with history of squamous cell carcinoma to the R inguinal/scrotal area had it excised initially here, but after a recurrence in February was referred to [facility] where he was started on Chemo and radiation treatments about 2 weeks ago. He reports good improvement in size and smell of the necrotic mass and that is not why he is here today. He came today because he has had persistent nausea and vomiting since beginning his chemo 2 weeks ago. He says he got some nausea medicine at his last chemo treatment last week which helped some but has been unable to keep anything down this week, has had 14lb weight loss. Now also having moderate to severe aching pain with swallowing and diffuse cramping abdominal pain associated with abdominal distention. He has not had BM in several days. He has reported history of volvulus and was advised to come in for evaluation. Sent to the ED from [facility].   Past Medical History  Diagnosis Date  . Diabetes mellitus without complication   . Hypertension   . Mass of right inguinal region   . Squamous cell carcinoma   . Adrenal adenoma   . Sigmoid diverticulitis   . Skin cancer     Past Surgical History  Procedure Laterality Date  . Wart removal      Scrotal wart removal 10+ years ago  . Cystoscopy  07/18/2012    Procedure: CYSTOSCOPY;  Surgeon: Sebastian Ache, MD;  Location: [facility] ORS;  Service: Urology;  Laterality: N/A;  . Mass excision  07/18/2012    Procedure: EXCISION MASS;  Surgeon: Sebastian Ache, MD;  Location: [facility];   Service: Urology;  Laterality: Right;    Family History  Problem Relation Age of Onset  . CVA Father   . Hypertension Father   . Cancer - Other Mother     Doesnot know what type  . Hypertension Mother   . Hypertension Sister   . Hypertension Brother   . Diabetes Mother   . Diabetes Father   . Diabetes Brother     History  Substance Use Topics  . Smoking status: Former Smoker -- 0.25 packs/day for 40 years  . Smokeless tobacco: Never Used  . Alcohol Use: 5.0 oz/week    10 drink(s) per week     Comment: Social      Review of Systems  Gastrointestinal: Positive for abdominal pain.   All other systems reviewed and are negative except as noted in HPI.   Allergies  Review of patient's allergies indicates no known allergies.  Home Medications   Current Outpatient Rx  Name  Route  Sig  Dispense  Refill  . docusate sodium (COLACE) 100 MG capsule   Oral   Take 100 mg by mouth 2 (two) times daily.         Marland Kitchen HYDROcodone-acetaminophen (NORCO/VICODIN) 5-325 MG per tablet   Oral   Take 1-2 tablets by mouth every 6 (six) hours as needed.   30 tablet   0   . lisinopril (PRINIVIL,ZESTRIL) 20 MG tablet   Oral  Take 1 tablet (20 mg total) by mouth daily.   30 tablet   11   . metFORMIN (GLUCOPHAGE) 1000 MG tablet   Oral   Take 1 tablet (1,000 mg total) by mouth 2 (two) times daily with a meal.   60 tablet   11   . metoprolol tartrate (LOPRESSOR) 12.5 mg TABS   Oral   Take 0.5 tablets (12.5 mg total) by mouth 2 (two) times daily.   30 tablet   0   . morphine (MSIR) 15 MG tablet   Oral   Take 15 mg by mouth every 4 (four) hours as needed for pain.         Marland Kitchen nystatin (MYCOSTATIN) powder   Topical   Apply 1 g topically 4 (four) times daily.         . ondansetron (ZOFRAN) 4 MG tablet   Oral   Take 4 mg by mouth every 8 (eight) hours as needed for nausea.         Marland Kitchen oxyCODONE (OXY IR/ROXICODONE) 5 MG immediate release tablet   Oral   Take 5-10 mg by  mouth every 4 (four) hours as needed for pain.         . promethazine (PHENERGAN) 25 MG tablet   Oral   Take 25 mg by mouth every 6 (six) hours as needed for nausea.         Marland Kitchen senna (SENOKOT) 8.6 MG TABS   Oral   Take 1 tablet by mouth 2 (two) times daily.           BP 105/70  Pulse 105  Temp(Src) 98.5 F (36.9 C) (Oral)  Resp 18  SpO2 97%  Physical Exam  Nursing note and vitals reviewed. Constitutional: He is oriented to person, place, and time. He appears well-developed and well-nourished.  HENT:  Head: Normocephalic and atraumatic.  Eyes: EOM are normal. Pupils are equal, round, and reactive to light.  Neck: Normal range of motion. Neck supple.  Cardiovascular: Normal rate, normal heart sounds and intact distal pulses.   Pulmonary/Chest: Effort normal and breath sounds normal.  Abdominal: Soft. He exhibits no distension. There is tenderness (diffuse tenderness). There is no rebound and no guarding.  Decreased bowel sounds  Genitourinary:  Necrotic fungating massing in R inguinal area with foul odor, no drainage  Musculoskeletal: Normal range of motion. He exhibits no edema and no tenderness.  Neurological: He is alert and oriented to person, place, and time. He has normal strength. No cranial nerve deficit or sensory deficit.  Skin: Skin is warm and dry. No rash noted.  Psychiatric: He has a normal mood and affect.    ED Course  Procedures (including critical care time)  Labs Reviewed  CBC WITH DIFFERENTIAL - Abnormal; Notable for the following:    RBC 3.55 (*)    Hemoglobin 10.5 (*)    HCT 30.7 (*)    Neutrophils Relative 86 (*)    Lymphocytes Relative 3 (*)    Lymphs Abs 0.3 (*)    All other components within normal limits  COMPREHENSIVE METABOLIC PANEL - Abnormal; Notable for the following:    Glucose, Bld 165 (*)    BUN 26 (*)    Creatinine, Ser 1.56 (*)    Albumin 2.8 (*)    Total Bilirubin 0.2 (*)    GFR calc non Af Amer 47 (*)    GFR calc Af  Amer 55 (*)    All other components within normal limits  URINALYSIS, ROUTINE W REFLEX MICROSCOPIC - Abnormal; Notable for the following:    Color, Urine AMBER (*)    APPearance CLOUDY (*)    Hgb urine dipstick SMALL (*)    Bilirubin Urine SMALL (*)    Ketones, ur 15 (*)    Protein, ur 30 (*)    All other components within normal limits  URINE MICROSCOPIC-ADD ON - Abnormal; Notable for the following:    Squamous Epithelial / LPF FEW (*)    Casts HYALINE CASTS (*)    All other components within normal limits   Ct Abdomen Pelvis W Contrast  11/21/2012  *RADIOLOGY REPORT*  Clinical Data: Abdominal pain.  Nausea and vomiting.  Diarrhea. Squamous cell carcinoma of the scrotum, undergoing chemotherapy and radiation therapy.  CT ABDOMEN AND PELVIS WITH CONTRAST  Technique:  Multidetector CT imaging of the abdomen and pelvis was performed following the standard protocol during bolus administration of intravenous contrast.  Contrast: OMNIPAQUE IOHEXOL 300 MG/ML  SOLN  Comparison: 08/06/2012  Findings: 3 mm nodule in the right lower lobe along the right hemidiaphragm merits observation, and appears stable from the recent PET CT.  There is mild atelectasis along the anterior margin of the left major fissure.  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  The malrotation is causing a small bowel obstruction due to twisting in the distal duodenum or junction of the duodenum and jejunum, with transition point visible on image 42 of series 400.  Distended stomach and distended duodenum proximal to this point observed; the bowel is decompressed distal to this point.  Mild wall thickening is present in the distal esophagus, query mild esophagitis.  Subtle enhancement is observed in segment 5 of the liver inferiorly as shown on prior chest CT of 07/18/2012 - on PET CT there was a specific high activity in this vicinity.  Possible hemangioma but technically nonspecific.  Spleen and pancreas  unremarkable.  Hypodense renal lesions are likely cysts.  Small right adrenal adenoma is again noted.  The appendix unremarkable.  Urinary bladder normal.  Soft tissue mass in the right scrotum compatible with malignancy. This appears to have increased in size in the interim, with regional soft tissue density measuring proximally 4.7 x 9.9 cm, and more continuous and less nodular than on the prior exam.  IMPRESSION:  1.  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  Distended stomach and distended duodenum noted. 2.  Subjective increase in size of the right scrotal mass. 3.  Subtle enhancement in segment 5 of the liver as on prior chest CT.  Although this may well represent hemangioma, and was not obviously hypermetabolic on PET CT, this merits attention on follow- up studies. 4.  3 mm right basilar pulmonary nodule also merits observation. 5.  Bilateral renal cysts. 6.  Right adrenal adenoma. 7.  Suspected mild distal esophagitis.   Original Report Authenticated By: Gaylyn Rong, M.D.    Dg Abd Acute W/chest  11/21/2012  *RADIOLOGY REPORT*  Clinical Data: Small bowel obstruction.  ACUTE ABDOMEN SERIES (ABDOMEN 2 VIEW & CHEST 1 VIEW)  Comparison: CT abdomen and pelvis 10/11/2012.  Findings: The lung volumes are low.  Minimal atelectasis is evident at the lung base.  Heart size is normal.  Supine and upright views the abdomen demonstrate fluid levels within nondilated loops of small bowel in the mid abdomen.  The colonic gas pattern is normal.  IMPRESSION:  1.  Fluid  levels within nondilated bowel in the mid abdomen most compatible with a focal ileus.  Early small bowel obstruction is not excluded. 2.  Normal colonic gas pattern. 3.  Low lung volumes and left basilar atelectasis.   Original Report Authenticated By: Marin Roberts, M.D.      1. SBO (small bowel obstruction)   2. Squamous cell carcinoma       MDM  No concerns about  infection in his inguinal mass, this is improved per patient and wife since initiating treatment. Will evaluate for SBO/volvulus. Also concern for mucositis in esophagus vs GERD although his oropharynx is normal.   11:11 PM Discussed CT findings with Dr. Corliss Skains on call for Surgery who will evaluate the patient in the ED. NG tube placed with large amount of bilious and feculent material.       Bonnita Levan. Bernette Mayers, MD 11/21/12 2311

## 2012-11-21 NOTE — ED Notes (Signed)
Pt in radiology 

## 2012-11-21 NOTE — ED Notes (Addendum)
CT has been notified that pt has finished first cup of contrast

## 2012-11-21 NOTE — ED Notes (Signed)
Surgeon at bedside.  

## 2012-11-21 NOTE — ED Provider Notes (Signed)
Chief Complaint:   Chief Complaint  Patient presents with  . Sore Throat  . Abdominal Pain    History of Present Illness:   Theodore Flores is an unfortunate 57 year old male who has a six-month history of what he thought was an abscess on his right scrotum. He presented here on November 29 and was sent to the hospital, then eventually to [facility] where he was diagnosed as having a scrotal carcinoma. He's been followed at [facility] since then and is on radiation and chemotherapy. The therapy is shrinking down his tumor, but still very large, necrotic, foul-smelling. He had been doing fairly well up until the past 2 weeks when he has gradually gone downhill with odynophagia, difficulty eating or drinking anything, he's lost 14 pounds in 2 weeks, he's had nausea and vomiting of all by mouth intake. He has pain in his teeth and mouth, abdominal pain, swelling, has not had a bowel movement in 2 weeks. He's felt somewhat warm, had a little bit of cough, trouble breathing, and aching in his chest. He does not have any definite fever, nasal congestion, swollen glands, or urinary symptoms.  Review of Systems:  Other than noted above, the patient denies any of the following symptoms. Systemic:  No fever, chills, sweats, fatigue, myalgias, headache, or anorexia. Eye:  No redness, pain or drainage. ENT:  No earache, nasal congestion, rhinorrhea, sinus pressure, or sore throat. Lungs:  No cough, sputum production, wheezing, shortness of breath.  Cardiovascular:  No chest pain, palpitations, or syncope. GI:  No nausea, vomiting, abdominal pain or diarrhea. GU:  No dysuria, frequency, or hematuria. Skin:  No rash or pruritis.  PMFSH:  Past medical history, family history, social history, meds, and allergies were reviewed.  He has no medication allergies. He has high blood pressure diabetes. He takes metformin, oxycodone, lisinopril, morphine, metoprolol, and several nausea pills.  Physical Exam:   Vital  signs:  BP 94/60  Pulse 110  Temp(Src) 98.9 F (37.2 C) (Oral)  Resp 20  SpO2 96% General:  Alert, in no distress. Eye:  PERRL, full EOMs.  Lids and conjunctivas were normal. ENT:  TMs and canals were normal, without erythema or inflammation.  Nasal mucosa was clear and uncongested, without drainage.  Mucous membranes were moist.  Pharynx was clear, without exudate or drainage.  There were no oral ulcerations or lesions.  There is nothing to explain his difficulty swallowing. His throat is completely clear. Neck:  Supple, no adenopathy, tenderness or mass. Thyroid was normal. Lungs:  No respiratory distress.  Lungs were clear to auscultation, without wheezes, rales or rhonchi.  Breath sounds were clear and equal bilaterally. Heart:  Regular rhythm, without gallops, murmers or rubs. Abdomen:  Abdomen is distended, tight, and tympanitic. He has moderate generalized tenderness to palpation without guarding or rebound. Bowel sounds are decreased. Genital exam: He has a large, fist size tumor on his right scrotum which is fungating, necrotic, and malodorous. His entire right scrotum is swollen and extremely tender to touch. Skin:  Clear, warm, and dry, without rash or lesions.  Assessment:  The primary encounter diagnosis was Odynophagia. Diagnoses of Intestinal obstruction, Nausea and vomiting, Scrotal cancer, and Weight loss were also pertinent to this visit.  The patient is clearly gone downhill over the past 2 weeks. This may be effects of chemotherapy or radiation therapy or possibly tumor metastasis. He needs further hospital workup including possibly CTs of his neck, chest, and abdomen as well as IV hydration  and possibly parenteral nutrition as well.  Plan:   1.  The following meds were prescribed:   New Prescriptions   No medications on file   2.  The patient was instructed in symptomatic care and handouts were given. 3.  The patient was transferred to the emergency department at the  hospital in stable condition.  Medical Decision Making:  57 year old male with a large necrotic cancer on his right scrotum, diagnosed here last November, has been followed at [facility] with radiation therapy and chemotherapy.  Over the past 2 weeks he has progressively gotten worse with odynophagia, inability to eat or drink, nausea and vomiting of all PO intake, 14 lb. Weight loss, abdominal pain, swelling, and constipation.  His exam showed normal pharynx, distended abdomin which was tender and typanitic, decreased bowel sounds, and a large, fungating, necrotic, malodorous mass on this right scrotum.  He needs further hospital evaluation including CT of neck and abdomen, and IV fluids and possibly parenteral nutrition.     Reuben Likes, MD 11/21/12 [phone]

## 2012-11-21 NOTE — H&P (Signed)
Theodore Flores is an 57 y.o. male.   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  He has a large necrotic area on his right scrotum that is actually getting smaller, per the patient.  He presents with nausea, vomiting, and abdominal distention over the last 2-3 days.  His last BM was 3 days ago.  He reports some flatus yesterday.  He has known malrotation that was diagnosed on staging scans for his SCC last year.    Unknown treatment regimen at [facility] - attempting to secure records.  Past Medical History  Diagnosis Date  . Diabetes mellitus without complication   . Hypertension   . Mass of right inguinal region   . Squamous cell carcinoma   . Adrenal adenoma   . Sigmoid diverticulitis   . Skin cancer     Past Surgical History  Procedure Laterality Date  . Wart removal      Scrotal wart removal 10+ years ago  . Cystoscopy  07/18/2012    Procedure: CYSTOSCOPY;  Surgeon: Sebastian Ache, MD;  Location: [facility];  Service: Urology;  Laterality: N/A;  . Mass excision  07/18/2012    Procedure: EXCISION MASS;  Surgeon: Sebastian Ache, MD;  Location: [facility];  Service: Urology;  Laterality: Right;    Family History  Problem Relation Age of Onset  . CVA Father   . Hypertension Father   . Cancer - Other Mother     Doesnot know what type  . Hypertension Mother   . Hypertension Sister   . Hypertension Brother   . Diabetes Mother   . Diabetes Father   . Diabetes Brother    Social History:  reports that he has quit smoking. He has never used smokeless tobacco. He reports that he drinks about 5.0 ounces of alcohol per week. He reports that he does not use illicit drugs.  Allergies: No Known Allergies  Prior to Admission medications   Medication Sig Start Date End Date Taking? Authorizing Provider  docusate sodium (COLACE) 100  MG capsule Take 100 mg by mouth 2 (two) times daily.   Yes Historical Provider, MD  HYDROcodone-acetaminophen (NORCO/VICODIN) 5-325 MG per tablet Take 1-2 tablets by mouth every 6 (six) hours as needed. 07/20/12  Yes Renee A Kuneff, DO  lisinopril (PRINIVIL,ZESTRIL) 20 MG tablet Take 1 tablet (20 mg total) by mouth daily. 10/21/11  Yes Lamar Laundry, MD  metFORMIN (GLUCOPHAGE) 1000 MG tablet Take 1 tablet (1,000 mg total) by mouth 2 (two) times daily with a meal. 10/21/11  Yes Lamar Laundry, MD  metoprolol tartrate (LOPRESSOR) 12.5 mg TABS Take 0.5 tablets (12.5 mg total) by mouth 2 (two) times daily. 11/10/12  Yes Eleanore E Debbra Riding, PA-C  morphine (MSIR) 15 MG tablet Take 15 mg by mouth every 4 (four) hours as needed for pain.   Yes Historical Provider, MD  nystatin (MYCOSTATIN) powder Apply 1 g topically 4 (four) times daily.   Yes Historical Provider, MD  ondansetron (ZOFRAN) 4 MG tablet Take 4 mg by mouth every 8 (eight) hours as needed for nausea.   Yes Historical Provider, MD  oxyCODONE (OXY IR/ROXICODONE) 5 MG immediate release tablet Take 5-10 mg by mouth every 4 (four) hours as needed for pain.   Yes Historical Provider, MD  promethazine (PHENERGAN) 25 MG tablet Take 25 mg by mouth every  6 (six) hours as needed for nausea.   Yes Historical Provider, MD  senna (SENOKOT) 8.6 MG TABS Take 1 tablet by mouth 2 (two) times daily.   Yes Historical Provider, MD    Results for orders placed during the hospital encounter of 11/21/12 (from the past 48 hour(s))  CBC WITH DIFFERENTIAL     Status: Abnormal   Collection Time    11/21/12  6:15 PM      Result Value Range   WBC 8.7  4.0 - 10.5 K/uL   RBC 3.55 (*) 4.22 - 5.81 MIL/uL   Hemoglobin 10.5 (*) 13.0 - 17.0 g/dL   HCT 86.5 (*) 78.4 - 69.6 %   MCV 86.5  78.0 - 100.0 fL   MCH 29.6  26.0 - 34.0 pg   MCHC 34.2  30.0 - 36.0 g/dL   RDW 29.5  28.4 - 13.2 %   Platelets 232  150 - 400 K/uL   Neutrophils Relative 86 (*) 43 - 77 %   Neutro Abs 7.5  1.7 -  7.7 K/uL   Lymphocytes Relative 3 (*) 12 - 46 %   Lymphs Abs 0.3 (*) 0.7 - 4.0 K/uL   Monocytes Relative 10  3 - 12 %   Monocytes Absolute 0.9  0.1 - 1.0 K/uL   Eosinophils Relative 1  0 - 5 %   Eosinophils Absolute 0.1  0.0 - 0.7 K/uL   Basophils Relative 0  0 - 1 %   Basophils Absolute 0.0  0.0 - 0.1 K/uL  COMPREHENSIVE METABOLIC PANEL     Status: Abnormal   Collection Time    11/21/12  6:15 PM      Result Value Range   Sodium 136  135 - 145 mEq/L   Potassium 4.9  3.5 - 5.1 mEq/L   Chloride 100  96 - 112 mEq/L   CO2 27  19 - 32 mEq/L   Glucose, Bld 165 (*) 70 - 99 mg/dL   BUN 26 (*) 6 - 23 mg/dL   Creatinine, Ser 4.40 (*) 0.50 - 1.35 mg/dL   Calcium 9.3  8.4 - 10.2 mg/dL   Total Protein 7.2  6.0 - 8.3 g/dL   Albumin 2.8 (*) 3.5 - 5.2 g/dL   AST 10  0 - 37 U/L   ALT 12  0 - 53 U/L   Alkaline Phosphatase 56  39 - 117 U/L   Total Bilirubin 0.2 (*) 0.3 - 1.2 mg/dL   GFR calc non Af Amer 47 (*) >90 mL/min   GFR calc Af Amer 55 (*) >90 mL/min   Comment:            The eGFR has been calculated     using the CKD EPI equation.     This calculation has not been     validated in all clinical     situations.     eGFR's persistently     <90 mL/min signify     possible Chronic Kidney Disease.  URINALYSIS, ROUTINE W REFLEX MICROSCOPIC     Status: Abnormal   Collection Time    11/21/12  6:30 PM      Result Value Range   Color, Urine AMBER (*) YELLOW   Comment: BIOCHEMICALS MAY BE AFFECTED BY COLOR   APPearance CLOUDY (*) CLEAR   Specific Gravity, Urine 1.029  1.005 - 1.030   pH 5.0  5.0 - 8.0   Glucose, UA NEGATIVE  NEGATIVE mg/dL   Hgb urine dipstick SMALL (*)  NEGATIVE   Bilirubin Urine SMALL (*) NEGATIVE   Ketones, ur 15 (*) NEGATIVE mg/dL   Protein, ur 30 (*) NEGATIVE mg/dL   Urobilinogen, UA 0.2  0.0 - 1.0 mg/dL   Nitrite NEGATIVE  NEGATIVE   Leukocytes, UA NEGATIVE  NEGATIVE  URINE MICROSCOPIC-ADD ON     Status: Abnormal   Collection Time    11/21/12  6:30 PM       Result Value Range   Squamous Epithelial / LPF FEW (*) RARE   Casts HYALINE CASTS (*) NEGATIVE   Urine-Other AMORPHOUS URATES/PHOSPHATES     Ct Abdomen Pelvis W Contrast  11/21/2012  *RADIOLOGY REPORT*  Clinical Data: Abdominal pain.  Nausea and vomiting.  Diarrhea. Squamous cell carcinoma of the scrotum, undergoing chemotherapy and radiation therapy.  CT ABDOMEN AND PELVIS WITH CONTRAST  Technique:  Multidetector CT imaging of the abdomen and pelvis was performed following the standard protocol during bolus administration of intravenous contrast.  Contrast: OMNIPAQUE IOHEXOL 300 MG/ML  SOLN  Comparison: 08/06/2012  Findings: 3 mm nodule in the right lower lobe along the right hemidiaphragm merits observation, and appears stable from the recent PET CT.  There is mild atelectasis along the anterior margin of the left major fissure.  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  The malrotation is causing a small bowel obstruction due to twisting in the distal duodenum or junction of the duodenum and jejunum, with transition point visible on image 42 of series 400.  Distended stomach and distended duodenum proximal to this point observed; the bowel is decompressed distal to this point.  Mild wall thickening is present in the distal esophagus, query mild esophagitis.  Subtle enhancement is observed in segment 5 of the liver inferiorly as shown on prior chest CT of 07/18/2012 - on PET CT there was a specific high activity in this vicinity.  Possible hemangioma but technically nonspecific.  Spleen and pancreas unremarkable.  Hypodense renal lesions are likely cysts.  Small right adrenal adenoma is again noted.  The appendix unremarkable.  Urinary bladder normal.  Soft tissue mass in the right scrotum compatible with malignancy. This appears to have increased in size in the interim, with regional soft tissue density measuring proximally 4.7 x 9.9 cm, and more continuous and less nodular than  on the prior exam.  IMPRESSION:  1.  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  Distended stomach and distended duodenum noted. 2.  Subjective increase in size of the right scrotal mass. 3.  Subtle enhancement in segment 5 of the liver as on prior chest CT.  Although this may well represent hemangioma, and was not obviously hypermetabolic on PET CT, this merits attention on follow- up studies. 4.  3 mm right basilar pulmonary nodule also merits observation. 5.  Bilateral renal cysts. 6.  Right adrenal adenoma. 7.  Suspected mild distal esophagitis.   Original Report Authenticated By: Gaylyn Rong, M.D.    Dg Abd Acute W/chest  11/21/2012  *RADIOLOGY REPORT*  Clinical Data: Small bowel obstruction.  ACUTE ABDOMEN SERIES (ABDOMEN 2 VIEW & CHEST 1 VIEW)  Comparison: CT abdomen and pelvis 10/11/2012.  Findings: The lung volumes are low.  Minimal atelectasis is evident at the lung base.  Heart size is normal.  Supine and upright views the abdomen demonstrate fluid levels within nondilated loops of small bowel in the mid abdomen.  The colonic gas pattern is normal.  IMPRESSION:  1.  Fluid levels within nondilated bowel in the mid abdomen most compatible with a focal ileus.  Early small bowel obstruction is not excluded. 2.  Normal colonic gas pattern. 3.  Low lung volumes and left basilar atelectasis.   Original Report Authenticated By: Marin Roberts, M.D.     Review of Systems  Eyes: Negative.   Gastrointestinal: Positive for nausea, vomiting and abdominal pain.  Neurological: Negative.   Endo/Heme/Allergies: Negative.     Blood pressure 116/67, pulse 108, temperature 98.5 F (36.9 C), temperature source Oral, resp. rate 15, SpO2 100.00%. Physical Exam  WDWN in NAD HEENT:  EOMI, sclera anicteric NG tube in place with large amount of bilious output Neck:  No masses, no thyromegaly Lungs:  CTA bilaterally; normal respiratory  effort CV:  Regular rate and rhythm; no murmurs Abd:  Distended; softer than before NG tube; mild diffuse tenderness Ext:  Well-perfused; no edema Skin:  Warm, dry; no sign of jaundice  Assessment/Plan 1.  Small bowel obstruction secondary to malrotation - transition point at duodenal/jejunal junction 2.  Volume depletion - elevated BUN/CR 3.  Currently undergoing chemo/ radiation for SCC of the right scrotum  Plan: Attempt to secure records from [facility] Admit for IV hydration/ bowel rest/ NG suction He will likely need operative intervention to correct the malrotation, but it would be preferable not to operate on him while he is actively undergoing chemotherapy and radiation.  Hopefully, decompression will allow the bowel obstruction to resolve and he can complete his treatment regimen.  Recheck  Labs and plain films in AM  [place] K. Corliss Skains, MD, [facility]  11/21/2012 10:55 PM   Analyce Tavares K. 11/21/2012, 10:41 PM

## 2012-11-22 ENCOUNTER — Encounter (HOSPITAL_COMMUNITY): Payer: Self-pay | Admitting: Anesthesiology

## 2012-11-22 ENCOUNTER — Encounter (HOSPITAL_COMMUNITY): Admission: EM | Disposition: A | Discharge status: Home / Self Care | Payer: Self-pay | Source: Home / Self Care

## 2012-11-22 ENCOUNTER — Inpatient Hospital Stay (HOSPITAL_COMMUNITY): Payer: 59

## 2012-11-22 ENCOUNTER — Inpatient Hospital Stay (HOSPITAL_COMMUNITY): Payer: 59 | Admitting: Anesthesiology

## 2012-11-22 ENCOUNTER — Encounter (HOSPITAL_COMMUNITY): Payer: Self-pay | Admitting: *Deleted

## 2012-11-22 HISTORY — PX: LAPAROTOMY: SHX154

## 2012-11-22 HISTORY — PX: APPENDECTOMY: SHX54

## 2012-11-22 LAB — BASIC METABOLIC PANEL
BUN: 19 mg/dL (ref 6–23)
CO2: 23 mEq/L (ref 19–32)
Calcium: 8.8 mg/dL (ref 8.4–10.5)
Chloride: 103 mEq/L (ref 96–112)
Creatinine, Ser: 1.17 mg/dL (ref 0.50–1.35)
Glucose, Bld: 122 mg/dL — ABNORMAL HIGH (ref 70–99)

## 2012-11-22 LAB — CBC
HCT: 29.4 % — ABNORMAL LOW (ref 39.0–52.0)
MCH: 30 pg (ref 26.0–34.0)
MCHC: 34.7 g/dL (ref 30.0–36.0)
MCV: 86.5 fL (ref 78.0–100.0)
Platelets: 215 10*3/uL (ref 150–400)
RDW: 12.2 % (ref 11.5–15.5)
WBC: 7.9 10*3/uL (ref 4.0–10.5)

## 2012-11-22 LAB — GLUCOSE, CAPILLARY: Glucose-Capillary: 237 mg/dL — ABNORMAL HIGH (ref 70–99)

## 2012-11-22 LAB — SURGICAL PCR SCREEN: MRSA, PCR: NEGATIVE

## 2012-11-22 LAB — HEMOGLOBIN A1C: Mean Plasma Glucose: 189 mg/dL — ABNORMAL HIGH (ref <117)

## 2012-11-22 SURGERY — LAPAROTOMY, EXPLORATORY
Anesthesia: General | Site: Abdomen | Wound class: Clean Contaminated

## 2012-11-22 MED ORDER — LIDOCAINE HCL (CARDIAC) 20 MG/ML IV SOLN
INTRAVENOUS | Status: DC | PRN
  Administered 2012-11-22: 80 mg via INTRAVENOUS

## 2012-11-22 MED ORDER — HYDROMORPHONE 0.3 MG/ML IV SOLN
INTRAVENOUS | Status: DC
  Administered 2012-11-22: 25 mL via INTRAVENOUS
  Administered 2012-11-22: 23:00:00 via INTRAVENOUS
  Administered 2012-11-22: 1.8 mg via INTRAVENOUS
  Administered 2012-11-23: 14:00:00 via INTRAVENOUS
  Administered 2012-11-23 (×2): 3.6 mg via INTRAVENOUS
  Administered 2012-11-23: 4.5 mg via INTRAVENOUS
  Administered 2012-11-23: 3.9 mg via INTRAVENOUS
  Administered 2012-11-23 – 2012-11-24 (×2): via INTRAVENOUS
  Administered 2012-11-24: 4.2 mg via INTRAVENOUS
  Administered 2012-11-24: 3.6 mg via INTRAVENOUS
  Administered 2012-11-24: 3.3 mg via INTRAVENOUS
  Administered 2012-11-24: 1.13 mg via INTRAVENOUS
  Administered 2012-11-24: 2.4 mg via INTRAVENOUS
  Administered 2012-11-25: 06:00:00 via INTRAVENOUS
  Administered 2012-11-25: 1.5 mg via INTRAVENOUS
  Administered 2012-11-25: 3 mg via INTRAVENOUS
  Administered 2012-11-25: 1.2 mg via INTRAVENOUS
  Administered 2012-11-25: 3.9 mg via INTRAVENOUS
  Administered 2012-11-26: 1.8 mg via INTRAVENOUS
  Administered 2012-11-26: 13:00:00 via INTRAVENOUS
  Administered 2012-11-26: 1.2 mg via INTRAVENOUS
  Administered 2012-11-26: 3 mg via INTRAVENOUS
  Administered 2012-11-27: 02:00:00 via INTRAVENOUS
  Administered 2012-11-27: 1.2 mg via INTRAVENOUS
  Administered 2012-11-27 (×2): 0.9 mg via INTRAVENOUS
  Administered 2012-11-27: 1.83 mg via INTRAVENOUS
  Administered 2012-11-27: 1.42 mg via INTRAVENOUS
  Administered 2012-11-27: 1.8 mg via INTRAVENOUS
  Administered 2012-11-27: 0.3 mg via INTRAVENOUS
  Administered 2012-11-28: 2.7 mg via INTRAVENOUS
  Administered 2012-11-28: 0.6 mg via INTRAVENOUS
  Administered 2012-11-28: 02:00:00 via INTRAVENOUS
  Administered 2012-11-28: 1.3 mg via INTRAVENOUS
  Administered 2012-11-28: 0.582 mg via INTRAVENOUS
  Administered 2012-11-29: 0.9 mg via INTRAVENOUS
  Filled 2012-11-22 (×11): qty 25

## 2012-11-22 MED ORDER — ENOXAPARIN SODIUM 40 MG/0.4ML ~~LOC~~ SOLN
40.0000 mg | Freq: Every day | SUBCUTANEOUS | Status: DC
  Administered 2012-11-23 – 2012-12-01 (×9): 40 mg via SUBCUTANEOUS
  Filled 2012-11-22 (×9): qty 0.4

## 2012-11-22 MED ORDER — DEXTROSE 5 % IV SOLN
2.0000 g | Freq: Once | INTRAVENOUS | Status: AC
  Administered 2012-11-22: 2 g via INTRAVENOUS
  Filled 2012-11-22: qty 2

## 2012-11-22 MED ORDER — ACETAMINOPHEN 10 MG/ML IV SOLN
INTRAVENOUS | Status: AC
  Administered 2012-11-22: 1000 mg
  Filled 2012-11-22: qty 100

## 2012-11-22 MED ORDER — NEOSTIGMINE METHYLSULFATE 1 MG/ML IJ SOLN
INTRAMUSCULAR | Status: DC | PRN
  Administered 2012-11-22: 4 mg via INTRAVENOUS

## 2012-11-22 MED ORDER — ONDANSETRON HCL 4 MG/2ML IJ SOLN: 4.0000 mg | Freq: Four times a day (QID) | INTRAMUSCULAR | Status: DC | PRN

## 2012-11-22 MED ORDER — ONDANSETRON HCL 4 MG/2ML IJ SOLN
INTRAMUSCULAR | Status: DC | PRN
  Administered 2012-11-22: 4 mg via INTRAVENOUS

## 2012-11-22 MED ORDER — DIPHENHYDRAMINE HCL 50 MG/ML IJ SOLN: 12.5000 mg | Freq: Four times a day (QID) | INTRAMUSCULAR | Status: DC | PRN

## 2012-11-22 MED ORDER — FENTANYL CITRATE 0.05 MG/ML IJ SOLN
INTRAMUSCULAR | Status: DC | PRN
  Administered 2012-11-22: 150 ug via INTRAVENOUS
  Administered 2012-11-22 (×3): 50 ug via INTRAVENOUS
  Administered 2012-11-22: 100 ug via INTRAVENOUS
  Administered 2012-11-22 (×2): 50 ug via INTRAVENOUS

## 2012-11-22 MED ORDER — 0.9 % SODIUM CHLORIDE (POUR BTL) OPTIME
TOPICAL | Status: DC | PRN
  Administered 2012-11-22 (×2): 1000 mL

## 2012-11-22 MED ORDER — DIPHENHYDRAMINE HCL 12.5 MG/5ML PO ELIX: 12.5000 mg | ORAL_SOLUTION | Freq: Four times a day (QID) | ORAL | Status: DC | PRN

## 2012-11-22 MED ORDER — MIDAZOLAM HCL 5 MG/5ML IJ SOLN
INTRAMUSCULAR | Status: DC | PRN
  Administered 2012-11-22: 2 mg via INTRAVENOUS

## 2012-11-22 MED ORDER — HYDROMORPHONE HCL PF 1 MG/ML IJ SOLN
INTRAMUSCULAR | Status: DC | PRN
  Administered 2012-11-22: 1 mg via INTRAVENOUS

## 2012-11-22 MED ORDER — PROPOFOL 10 MG/ML IV BOLUS
INTRAVENOUS | Status: DC | PRN
  Administered 2012-11-22: 50 mg via INTRAVENOUS
  Administered 2012-11-22: 150 mg via INTRAVENOUS

## 2012-11-22 MED ORDER — SODIUM CHLORIDE 0.9 % IJ SOLN: 9.0000 mL | INTRAMUSCULAR | Status: DC | PRN

## 2012-11-22 MED ORDER — METOCLOPRAMIDE HCL 5 MG/ML IJ SOLN: 10.0000 mg | Freq: Once | INTRAMUSCULAR | Status: DC | PRN

## 2012-11-22 MED ORDER — GLYCOPYRROLATE 0.2 MG/ML IJ SOLN
INTRAMUSCULAR | Status: DC | PRN
  Administered 2012-11-22: 0.6 mg via INTRAVENOUS

## 2012-11-22 MED ORDER — HYDRALAZINE HCL 20 MG/ML IJ SOLN: 10.0000 mg | INTRAMUSCULAR | Status: DC | PRN

## 2012-11-22 MED ORDER — LACTATED RINGERS IV SOLN
INTRAVENOUS | Status: DC | PRN
  Administered 2012-11-22 (×2): via INTRAVENOUS

## 2012-11-22 MED ORDER — ROCURONIUM BROMIDE 100 MG/10ML IV SOLN: INTRAVENOUS | Status: DC | PRN

## 2012-11-22 MED ORDER — OXYCODONE HCL 5 MG/5ML PO SOLN: 5.0000 mg | Freq: Once | ORAL | Status: DC | PRN

## 2012-11-22 MED ORDER — OXYCODONE HCL 5 MG PO TABS: 5.0000 mg | ORAL_TABLET | Freq: Once | ORAL | Status: DC | PRN

## 2012-11-22 MED ORDER — BIOTENE DRY MOUTH MT LIQD
15.0000 mL | Freq: Two times a day (BID) | OROMUCOSAL | Status: DC
  Administered 2012-11-22 – 2012-11-30 (×16): 15 mL via OROMUCOSAL

## 2012-11-22 MED ORDER — CHLORHEXIDINE GLUCONATE 0.12 % MT SOLN
15.0000 mL | Freq: Two times a day (BID) | OROMUCOSAL | Status: DC
  Administered 2012-11-22: 15 mL via OROMUCOSAL
  Filled 2012-11-22: qty 15

## 2012-11-22 MED ORDER — HYDROMORPHONE 0.3 MG/ML IV SOLN
INTRAVENOUS | Status: AC
  Filled 2012-11-22: qty 25

## 2012-11-22 MED ORDER — HYDROMORPHONE HCL PF 1 MG/ML IJ SOLN
0.2500 mg | INTRAMUSCULAR | Status: DC | PRN
  Administered 2012-11-22 (×4): 0.5 mg via INTRAVENOUS

## 2012-11-22 MED ORDER — LABETALOL HCL 5 MG/ML IV SOLN
INTRAVENOUS | Status: AC
  Administered 2012-11-22: 10 mg
  Filled 2012-11-22: qty 4

## 2012-11-22 MED ORDER — ROCURONIUM BROMIDE 100 MG/10ML IV SOLN
INTRAVENOUS | Status: DC | PRN
  Administered 2012-11-22 (×2): 10 mg via INTRAVENOUS
  Administered 2012-11-22: 20 mg via INTRAVENOUS
  Administered 2012-11-22: 50 mg via INTRAVENOUS

## 2012-11-22 MED ORDER — HYDROMORPHONE HCL PF 1 MG/ML IJ SOLN
INTRAMUSCULAR | Status: AC
  Filled 2012-11-22: qty 2

## 2012-11-22 MED ORDER — SUCCINYLCHOLINE CHLORIDE 20 MG/ML IJ SOLN
INTRAMUSCULAR | Status: DC | PRN
  Administered 2012-11-22: 100 mg via INTRAVENOUS

## 2012-11-22 MED ORDER — LABETALOL HCL 5 MG/ML IV SOLN
INTRAVENOUS | Status: DC | PRN
  Administered 2012-11-22 (×4): 5 mg via INTRAVENOUS

## 2012-11-22 MED ORDER — NALOXONE HCL 0.4 MG/ML IJ SOLN: 0.4000 mg | INTRAMUSCULAR | Status: DC | PRN

## 2012-11-22 SURGICAL SUPPLY — 53 items
BLADE SURG ROTATE 9660 (MISCELLANEOUS) ×1 IMPLANT
CANISTER SUCTION 2500CC (MISCELLANEOUS) ×2 IMPLANT
CHLORAPREP W/TINT 26ML (MISCELLANEOUS) ×2 IMPLANT
CLOTH BEACON ORANGE TIMEOUT ST (SAFETY) ×2 IMPLANT
COVER SURGICAL LIGHT HANDLE (MISCELLANEOUS) ×2 IMPLANT
DRAPE LAPAROSCOPIC ABDOMINAL (DRAPES) ×2 IMPLANT
DRAPE UTILITY 15X26 W/TAPE STR (DRAPE) ×4 IMPLANT
DRAPE WARM FLUID 44X44 (DRAPE) ×2 IMPLANT
DRSG PAD ABDOMINAL 8X10 ST (GAUZE/BANDAGES/DRESSINGS) ×1 IMPLANT
ELECT BLADE 6.5 EXT (BLADE) ×1 IMPLANT
ELECT CAUTERY BLADE 6.4 (BLADE) ×3 IMPLANT
ELECT REM PT RETURN 9FT ADLT (ELECTROSURGICAL) ×2
ELECTRODE REM PT RTRN 9FT ADLT (ELECTROSURGICAL) ×1 IMPLANT
GLOVE BIO SURGEON STRL SZ 6.5 (GLOVE) ×1 IMPLANT
GLOVE BIO SURGEON STRL SZ7 (GLOVE) ×6 IMPLANT
GLOVE BIOGEL PI IND STRL 7.0 (GLOVE) IMPLANT
GLOVE BIOGEL PI IND STRL 7.5 (GLOVE) ×1 IMPLANT
GLOVE BIOGEL PI IND STRL 8 (GLOVE) IMPLANT
GLOVE BIOGEL PI INDICATOR 7.0 (GLOVE) ×2
GLOVE BIOGEL PI INDICATOR 7.5 (GLOVE) ×2
GLOVE BIOGEL PI INDICATOR 8 (GLOVE) ×1
GLOVE ECLIPSE 6.5 STRL STRAW (GLOVE) ×2 IMPLANT
GLOVE ECLIPSE 7.5 STRL STRAW (GLOVE) ×1 IMPLANT
GOWN STRL NON-REIN LRG LVL3 (GOWN DISPOSABLE) ×7 IMPLANT
KIT BASIN OR (CUSTOM PROCEDURE TRAY) ×2 IMPLANT
KIT ROOM TURNOVER OR (KITS) ×2 IMPLANT
LIGASURE IMPACT 36 18CM CVD LR (INSTRUMENTS) ×1 IMPLANT
NS IRRIG 1000ML POUR BTL (IV SOLUTION) ×4 IMPLANT
PACK GENERAL/GYN (CUSTOM PROCEDURE TRAY) ×2 IMPLANT
PAD ARMBOARD 7.5X6 YLW CONV (MISCELLANEOUS) ×2 IMPLANT
PENCIL BUTTON HOLSTER BLD 10FT (ELECTRODE) ×1 IMPLANT
RELOAD PROXIMATE 75MM BLUE (ENDOMECHANICALS) ×2 IMPLANT
RELOAD STAPLE 75 3.8 BLU REG (ENDOMECHANICALS) IMPLANT
SPECIMEN JAR LARGE (MISCELLANEOUS) IMPLANT
SPONGE GAUZE 4X4 12PLY (GAUZE/BANDAGES/DRESSINGS) ×1 IMPLANT
SPONGE LAP 18X18 X RAY DECT (DISPOSABLE) ×2 IMPLANT
STAPLER PROXIMATE 75MM BLUE (STAPLE) ×1 IMPLANT
STAPLER VISISTAT 35W (STAPLE) ×2 IMPLANT
SUCTION POOLE TIP (SUCTIONS) ×2 IMPLANT
SUT PDS AB 1 TP1 96 (SUTURE) ×4 IMPLANT
SUT SILK 2 0 (SUTURE) ×2
SUT SILK 2 0 SH CR/8 (SUTURE) ×2 IMPLANT
SUT SILK 2-0 18XBRD TIE 12 (SUTURE) ×1 IMPLANT
SUT SILK 3 0 (SUTURE) ×2
SUT SILK 3 0 SH CR/8 (SUTURE) ×2 IMPLANT
SUT SILK 3-0 18XBRD TIE 12 (SUTURE) ×1 IMPLANT
SUT VIC AB 3-0 SH 27 (SUTURE)
SUT VIC AB 3-0 SH 27X BRD (SUTURE) IMPLANT
TAPE CLOTH SURG 6X10 WHT LF (GAUZE/BANDAGES/DRESSINGS) ×1 IMPLANT
TOWEL OR 17X26 10 PK STRL BLUE (TOWEL DISPOSABLE) ×2 IMPLANT
TRAY FOLEY CATH 14FRSI W/METER (CATHETERS) ×1 IMPLANT
WATER STERILE IRR 1000ML POUR (IV SOLUTION) IMPLANT
YANKAUER SUCT BULB TIP NO VENT (SUCTIONS) ×1 IMPLANT

## 2012-11-22 NOTE — Preoperative (Signed)
Beta Blockers   Reason not to administer Beta Blockers:Not Applicable 

## 2012-11-22 NOTE — Progress Notes (Signed)
INITIAL NUTRITION ASSESSMENT  DOCUMENTATION CODES Per approved  Criteria  -Obesity unspecified   INTERVENTION: 1.  Modify diet; per MD discretion based on pt tolerance.    NUTRITION DIAGNOSIS: Inadequate oral intake related to altered GI function as evidenced by malrotation causing emesis.   Monitor:  1.  Food/Beverage; resume of PO diet once medically appropriate.  Reason for Assessment: MST  57 y.o. male  Admitting Dx: abdominal pain, nausea, vomiting  ASSESSMENT: Pt admitted with abdominal pain, nausea, and vomiting r/t malrotation with obstruction.  Pt currently unavailable- in OR. Pt reported poor appetite and wt loss PTA. RD notes pt currently undergoing chemoradiation for SCC of scrotum. Pt with variable wt over the past months, however overall wt change of 5 lbs from 250 lbs to 245 lbs.   RD to follow post-op for ongoing assessment and warranted interventions.   Height: Ht Readings from Last 1 Encounters:  11/21/12 6' (1.829 m)    Weight: Wt Readings from Last 1 Encounters:  11/21/12 245 lb (111.131 kg)    Ideal Body Weight: 81kg  % Ideal Body Weight: 137%  Wt Readings from Last 10 Encounters:  11/21/12 245 lb (111.131 kg)  11/21/12 245 lb (111.131 kg)  11/10/12 245 lb 12.8 oz (111.494 kg)  09/21/12 256 lb (116.121 kg)  09/14/12 256 lb 6 oz (116.291 kg)  08/30/12 247 lb 8 oz (112.265 kg)  08/19/12 249 lb (112.946 kg)  07/29/12 242 lb 14.4 oz (110.179 kg)  07/19/12 241 lb 3.2 oz (109.408 kg)  07/19/12 241 lb 3.2 oz (109.408 kg)    Usual Body Weight: 250 lbs  % Usual Body Weight: 98%  BMI:  Body mass index is 33.22 kg/(m^2).  Estimated Nutritional Needs: Kcal: 2200-2500 Protein: 100-120g Fluid: >2.2 L/day  Skin: intact  Diet Order: NPO  EDUCATION NEEDS: -Education not appropriate at this time   Intake/Output Summary (Last 24 hours) at 11/22/12 1425 Last data filed at 11/22/12 1345  Gross per 24 hour  Intake   1775 ml  Output   2700 ml   Net   -925 ml    Last BM: 4/5  Labs:   Recent Labs Lab 11/21/12 1815 11/22/12 0637  NA 136 137  K 4.9 4.2  CL 100 103  CO2 27 23  BUN 26* 19  CREATININE 1.56* 1.17  CALCIUM 9.3 8.8  GLUCOSE 165* 122*    CBG (last 3)   Recent Labs  11/21/12 2353 11/22/12 0403 11/22/12 0757  GLUCAP 144* 118* 126*    Scheduled Meds: . [facility] HOLD] antiseptic oral rinse  15 mL Mouth Rinse q12n4p  . [facility] HOLD] chlorhexidine  15 mL Mouth Rinse BID  . [START ON 11/23/2012] enoxaparin (LOVENOX) injection  40 mg Subcutaneous Daily  . HYDROmorphone PCA 0.3 mg/mL   Intravenous Q4H  . [[facility] HOLD] insulin aspart  0-15 Units Subcutaneous Q4H  . [facility] HOLD] metoprolol  5 mg Intravenous Q12H  . [MAR HOLD] pantoprazole (PROTONIX) IV  40 mg Intravenous QHS    Continuous Infusions: . sodium chloride 125 mL/hr at 11/22/12 0756    Past Medical History  Diagnosis Date  . Diabetes mellitus without complication   . Hypertension   . Mass of right inguinal region   . Squamous cell carcinoma   . Adrenal adenoma   . Sigmoid diverticulitis   . Skin cancer     Past Surgical History  Procedure Laterality Date  . Wart removal      Scrotal wart removal 10+ years  ago  . Cystoscopy  07/18/2012    Procedure: CYSTOSCOPY;  Surgeon: Sebastian Ache, MD;  Location: [facility];  Service: Urology;  Laterality: N/A;  . Mass excision  07/18/2012    Procedure: EXCISION MASS;  Surgeon: Sebastian Ache, MD;  Location: [facility];  Service: Urology;  Laterality: Right;    Loyce Dys, MS RD LDN Clinical Inpatient Dietitian Pager: [phone] Weekend/After hours pager: [phone]

## 2012-11-22 NOTE — Progress Notes (Signed)
Subjective: Pt doing well, much less pain still in epigastric area.  No n/v.  +flatus, no BM yet.  Ambulating some.  Urinating well.    Objective: Vital signs in last 24 hours: Temp:  [98 F (36.7 C)-98.9 F (37.2 C)] 98.5 F (36.9 C) (04/07 0532) Pulse Rate:  [103-114] 114 (04/07 0532) Resp:  [13-20] 18 (04/07 0532) BP: (94-129)/(60-80) 129/73 mmHg (04/07 0532) SpO2:  [96 %-100 %] 99 % (04/07 0532) Weight:  [245 lb (111.131 kg)] 245 lb (111.131 kg) (04/06 2335) Last BM Date: 11/18/12  Intake/Output from previous day: 04/06 0701 - 04/07 0700 In: 750 [I.V.:650; NG/GT:100] Out: 2600 [Urine:200; Emesis/NG output:2400] Intake/Output this shift:    PE: Gen:  Alert, NAD, pleasant Abd: Soft, mild tenderness epigastrium, ND, +BS, no HSM   Lab Results:   Recent Labs  11/21/12 1815 11/22/12 0637  WBC 8.7 7.9  HGB 10.5* 10.2*  HCT 30.7* 29.4*  PLT 232 215   BMET  Recent Labs  11/21/12 1815  NA 136  K 4.9  CL 100  CO2 27  GLUCOSE 165*  BUN 26*  CREATININE 1.56*  CALCIUM 9.3   PT/INR No results found for this basename: LABPROT, INR,  in the last 72 hours CMP     Component Value Date/Time   NA 136 11/21/2012 1815   NA 147* 07/29/2012 1011   K 4.9 11/21/2012 1815   K 4.2 07/29/2012 1011   CL 100 11/21/2012 1815   CL 108* 07/29/2012 1011   CO2 27 11/21/2012 1815   CO2 24 07/29/2012 1011   GLUCOSE 165* 11/21/2012 1815   GLUCOSE 131* 07/29/2012 1011   BUN 26* 11/21/2012 1815   BUN 10.0 07/29/2012 1011   CREATININE 1.56* 11/21/2012 1815   CREATININE 1.1 07/29/2012 1011   CREATININE 1.03 10/21/2011 1944   CALCIUM 9.3 11/21/2012 1815   CALCIUM 9.3 07/29/2012 1011   PROT 7.2 11/21/2012 1815   PROT 6.8 07/29/2012 1011   ALBUMIN 2.8* 11/21/2012 1815   ALBUMIN 3.5 07/29/2012 1011   AST 10 11/21/2012 1815   AST 16 07/29/2012 1011   ALT 12 11/21/2012 1815   ALT 29 07/29/2012 1011   ALKPHOS 56 11/21/2012 1815   ALKPHOS 63 07/29/2012 1011   BILITOT 0.2* 11/21/2012 1815   BILITOT 0.37  07/29/2012 1011   GFRNONAA 47* 11/21/2012 1815   GFRAA 55* 11/21/2012 1815   Lipase  No results found for this basename: lipase       Studies/Results: Ct Abdomen Pelvis W Contrast  11/21/2012  *RADIOLOGY REPORT*  Clinical Data: Abdominal pain.  Nausea and vomiting.  Diarrhea. Squamous cell carcinoma of the scrotum, undergoing chemotherapy and radiation therapy.  CT ABDOMEN AND PELVIS WITH CONTRAST  Technique:  Multidetector CT imaging of the abdomen and pelvis was performed following the standard protocol during bolus administration of intravenous contrast.  Contrast: OMNIPAQUE IOHEXOL 300 MG/ML  SOLN  Comparison: 08/06/2012  Findings: 3 mm nodule in the right lower lobe along the right hemidiaphragm merits observation, and appears stable from the recent PET CT.  There is mild atelectasis along the anterior margin of the left major fissure.  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  The malrotation is causing a small bowel obstruction due to twisting in the distal duodenum or junction of the duodenum and jejunum, with transition point visible on image 42 of series 400.  Distended stomach and distended duodenum proximal to this point observed; the bowel is decompressed distal to this point.  Mild wall thickening is present in the distal esophagus, query mild esophagitis.  Subtle enhancement is observed in segment 5 of the liver inferiorly as shown on prior chest CT of 07/18/2012 - on PET CT there was a specific high activity in this vicinity.  Possible hemangioma but technically nonspecific.  Spleen and pancreas unremarkable.  Hypodense renal lesions are likely cysts.  Small right adrenal adenoma is again noted.  The appendix unremarkable.  Urinary bladder normal.  Soft tissue mass in the right scrotum compatible with malignancy. This appears to have increased in size in the interim, with regional soft tissue density measuring proximally 4.7 x 9.9 cm, and more continuous and less  nodular than on the prior exam.  IMPRESSION:  1.  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  Distended stomach and distended duodenum noted. 2.  Subjective increase in size of the right scrotal mass. 3.  Subtle enhancement in segment 5 of the liver as on prior chest CT.  Although this may well represent hemangioma, and was not obviously hypermetabolic on PET CT, this merits attention on follow- up studies. 4.  3 mm right basilar pulmonary nodule also merits observation. 5.  Bilateral renal cysts. 6.  Right adrenal adenoma. 7.  Suspected mild distal esophagitis.   Original Report Authenticated By: Gaylyn Rong, M.D.    Dg Abd Acute W/chest  11/21/2012  *RADIOLOGY REPORT*  Clinical Data: Small bowel obstruction.  ACUTE ABDOMEN SERIES (ABDOMEN 2 VIEW & CHEST 1 VIEW)  Comparison: CT abdomen and pelvis 10/11/2012.  Findings: The lung volumes are low.  Minimal atelectasis is evident at the lung base.  Heart size is normal.  Supine and upright views the abdomen demonstrate fluid levels within nondilated loops of small bowel in the mid abdomen.  The colonic gas pattern is normal.  IMPRESSION:  1.  Fluid levels within nondilated bowel in the mid abdomen most compatible with a focal ileus.  Early small bowel obstruction is not excluded. 2.  Normal colonic gas pattern. 3.  Low lung volumes and left basilar atelectasis.   Original Report Authenticated By: Marin Roberts, M.D.     Anti-infectives: Anti-infectives   None       Assessment/Plan 1. Small bowel obstruction secondary to malrotation - transition point at duodenal/jejunal junction  2. Volume depletion - resolved 3. Currently undergoing chemo/ radiation for SCC of the right scrotum   Plan:  1.  Called Dr. Max Fickle (oncologist) who said we can hold all chemo/radiation for now and just continue home meds as we normally would.  He is aware of his hospitalization and recommended  him to come to the hospital if he experienced N/V abdominal pain. 2.  Admit for IV hydration/ bowel rest/ NG suction  3.  He will likely need operative intervention to correct the malrotation, but it would be preferable not to operate on him while he is actively undergoing chemotherapy and radiation.  4.  Conservative treatment seems to have worked.  Will clamp NG for 4 hours then start clear sips, if tolerating can D/c ng tube and start clear liquid tray 5.  Pt will f/u with Dr. Max Fickle upon discharge 6.  May be able to go home tomorrow if continues to progress     LOS: 1 day    DORT, Aundra Millet 11/22/2012, 8:02 AM Pager: [phone]

## 2012-11-22 NOTE — Progress Notes (Signed)
I have reviewed all studies, examined patient and discussed care with his medical oncologist at [facility]. He is receiving low dose cisplatin as a radiosensitizer as well as xrt for scc of scrotum.  In his staging ct scans he had malrotation noted but was completely asymptomatic.  He was seen by surgery and recommended that if he developed sx to come in immediately.  He has now had episode of significant abdominal pain associated with about 2 liters of bilious emesis.  He was seen and admitted by one of my partners.  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  He is better today with no abdominal pain subjectively or objectively.  His ng still has some bilious output. His contrast is in his colon on plain film but stomach still dilated to my view.  I think only course of action now to prevent further episodes and prevent midgut volvulus is surgery.  I have recommended that to him and will await his wife to get her.

## 2012-11-22 NOTE — Anesthesia Procedure Notes (Signed)
Procedure Name: Intubation Date/Time: 11/22/2012 12:27 PM Performed by: Ellin Goodie Pre-anesthesia Checklist: Patient identified, Emergency Drugs available, Suction available, Patient being monitored and Timeout performed Patient Re-evaluated:Patient Re-evaluated prior to inductionOxygen Delivery Method: Circle system utilized Preoxygenation: Pre-oxygenation with 100% oxygen Intubation Type: IV induction, Cricoid Pressure applied and Rapid sequence Laryngoscope Size: Mac and 4 Grade View: Grade II Tube type: Oral Tube size: 8.0 mm Number of attempts: 1 Airway Equipment and Method: Stylet Placement Confirmation: ETT inserted through vocal cords under direct vision,  positive ETCO2 and breath sounds checked- equal and bilateral Secured at: 22 cm Tube secured with: Tape Dental Injury: Teeth and Oropharynx as per pre-operative assessment  Comments: Stomach decompressed via NGT prior to induction.

## 2012-11-22 NOTE — Anesthesia Postprocedure Evaluation (Signed)
  Anesthesia Post-op Note  Patient: Theodore Flores  Procedure(s) Performed: Procedure(s): EXPLORATORY LAPAROTOMY LADD'S PROCEDURE (N/A) APPENDECTOMY (N/A)  Patient Location: PACU  Anesthesia Type:General  Level of Consciousness: awake, alert  and oriented  Airway and Oxygen Therapy: Patient Spontanous Breathing and Patient connected to nasal cannula oxygen  Post-op Pain: mild  Post-op Assessment: Post-op Vital signs reviewed, Patient's Cardiovascular Status Stable, Respiratory Function Stable, Patent Airway and Pain level controlled  Post-op Vital Signs: stable  Complications: No apparent anesthesia complications

## 2012-11-22 NOTE — Progress Notes (Signed)
Patient ID: Theodore Flores, male   DOB: 1955-11-09, 56 y.o.   MRN: [id number] I have discussed situation with he and his wife.  I have recommended ex lap with ladds procedure and described this in detail to them.  We discussed risks also.  Will plan on proceeding soon.

## 2012-11-22 NOTE — Op Note (Signed)
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 Harden Mo Asst: Dr Flonnie Hailstone [place] Anesthesia Gen. Endotracheal Estimated blood loss: 100 cc Complications: None Drains: None Sponge needle count correct at end of operation Disposition to recovery in stable condition  Indications: This is a 57 year old male with a recurrent squamous cell carcinoma the scrotum currently is receiving low-dose cis-platinum and radiotherapy at the [facility]. He was noted on his staging CT scans to have a malrotation with a partial obstruction. He was asymptomatic at that time and was decided to be followed. Prior to this admission he had acute onset of epigastric pain associated with high volume of emesis. This is better with the nasogastric tube. I saw him and discussed with him going to the operating room due to the fact that he very clearly is at symptoms from his malrotation now. I also discussed this with his oncologist.  Procedure: After informed consent was obtained the patient was taken to the operating room. He was given 2 g of intravenous cefoxitin. Sequential compression devices were on his legs. He was then placed under general endotracheal anesthesia without complication. His abdomen was then prepped and draped in the standard sterile surgical fashion. A surgical timeout was performed.  I made a midline incision and carried this out through the fascia. The peritoneum was entered without difficulty. I then eventually was able to identify his colon and the small bowel. He was eviscerated. He very clearly had malrotation with the cecum in his right upper quadrant and this was all attached with Ladd's bands to his duodenum. I started to release the small bowel including all the bands. Eventually I was able to identify the terminal ileum and  the right colon. I released the bands with a combination of cautery as well as sharp dissection and widened the base of its mesentery as well as released all the bands from the cecum to his duodenum. Once I had done this it was very clear the obstruction was at his duodenal jejunal angle. There was a band of tissue going over this that I released with a combination of cautery and sharp dissection as well. This very clearly relieved his obstruction. I then spent about an hour lysing all of the bands that were present. Upon completion he had from his stomach all the way to the terminal ileum mobile small intestine without any bands associated with it. There was a rent in the mesentery where the terminal ileum down into the right colon were taken of these bands are repaired with 2-0 silk suture. His colon was also freed and this was on the left side of his abdomen. I then encircled his appendix. This was divided with a GIA stapler and the mesentery was removed with a LigaSure device. This was passed off the table as a specimen. Hemostasis was observed. I then placed the colon on the left side of the abdomen and the small bowel on the right side of the abdomen. This was all patent and healthy. It remained healthy throughout the operation. I then closed his peritoneum and fascia with #1 loop PDS. The skin was then irrigated and closed with staples. A dressing was placed. He tolerated this well was extubated and transferred to the recovery room in stable condition.

## 2012-11-22 NOTE — Anesthesia Preprocedure Evaluation (Addendum)
Anesthesia Evaluation  Patient identified by MRN, date of birth, ID band Patient awake    Reviewed: Allergy & Precautions, H&P , NPO status , Patient's Chart, lab work & pertinent test results, reviewed documented beta blocker date and time   Airway Mallampati: II TM Distance: >3 FB Neck ROM: full    Dental  (+) Dental Advisory Given, Chipped and Missing,    Pulmonary neg pulmonary ROS,  breath sounds clear to auscultation        Cardiovascular hypertension, On Medications and On Home Beta Blockers Rhythm:regular     Neuro/Psych negative neurological ROS  negative psych ROS   GI/Hepatic negative GI ROS, Neg liver ROS,   Endo/Other  diabetes, Well Controlled, Type 2, Oral Hypoglycemic Agents  Renal/GU negative Renal ROS  negative genitourinary   Musculoskeletal   Abdominal (+)  Abdomen: tender. Bowel sounds: decreased.  Peds  Hematology negative hematology ROS (+)   Anesthesia Other Findings See surgeon's H&P Pt with poor dentition, several loose and chipped/missing teeth.  Caps on front top.  Dental advisory given.    Reproductive/Obstetrics negative OB ROS                        Anesthesia Physical Anesthesia Plan  ASA: III and emergent  Anesthesia Plan: General   Post-op Pain Management:    Induction: Intravenous, Rapid sequence and Cricoid pressure planned  Airway Management Planned: Oral ETT  Additional Equipment:   Intra-op Plan:   Post-operative Plan: Extubation in OR  Informed Consent: I have reviewed the patients History and Physical, chart, labs and discussed the procedure including the risks, benefits and alternatives for the proposed anesthesia with the patient or authorized representative who has indicated his/her understanding and acceptance.   Dental Advisory Given  Plan Discussed with: CRNA and Surgeon  Anesthesia Plan Comments:        Anesthesia Quick  Evaluation

## 2012-11-22 NOTE — Transfer of Care (Signed)
Immediate Anesthesia Transfer of Care Note  Patient: Theodore Flores  Procedure(s) Performed: Procedure(s): EXPLORATORY LAPAROTOMY LADD'S PROCEDURE (N/A) APPENDECTOMY (N/A)  Patient Location: PACU  Anesthesia Type:General  Level of Consciousness: awake, alert  and sedated  Airway & Oxygen Therapy: Patient connected to face mask  Post-op Assessment: Report given to PACU RN  Post vital signs: stable  Complications: No apparent anesthesia complications

## 2012-11-23 DIAGNOSIS — E118 Type 2 diabetes mellitus with unspecified complications: Secondary | ICD-10-CM

## 2012-11-23 DIAGNOSIS — R000 Tachycardia, unspecified: Secondary | ICD-10-CM

## 2012-11-23 DIAGNOSIS — I10 Essential (primary) hypertension: Secondary | ICD-10-CM

## 2012-11-23 LAB — GLUCOSE, CAPILLARY
Glucose-Capillary: 157 mg/dL — ABNORMAL HIGH (ref 70–99)
Glucose-Capillary: 206 mg/dL — ABNORMAL HIGH (ref 70–99)
Glucose-Capillary: 160 mg/dL — ABNORMAL HIGH (ref 70–99)
Glucose-Capillary: 127 mg/dL — ABNORMAL HIGH (ref 70–99)
Glucose-Capillary: 152 mg/dL — ABNORMAL HIGH (ref 70–99)

## 2012-11-23 LAB — CBC
Hemoglobin: 10.8 g/dL — ABNORMAL LOW (ref 13.0–17.0)
MCHC: 34.6 g/dL (ref 30.0–36.0)
RDW: 12.1 % (ref 11.5–15.5)
WBC: 9.5 10*3/uL (ref 4.0–10.5)

## 2012-11-23 LAB — BASIC METABOLIC PANEL
Chloride: 105 mEq/L (ref 96–112)
GFR calc Af Amer: 90 mL/min — ABNORMAL LOW (ref >90)
GFR calc non Af Amer: 77 mL/min — ABNORMAL LOW (ref >90)
Glucose, Bld: 164 mg/dL — ABNORMAL HIGH (ref 70–99)
Potassium: 5 mEq/L (ref 3.5–5.1)
Sodium: 138 mEq/L (ref 135–145)

## 2012-11-23 MED ORDER — WHITE PETROLATUM GEL
Status: AC
  Filled 2012-11-23: qty 5

## 2012-11-23 MED ORDER — SODIUM CHLORIDE 0.9 % IV BOLUS (SEPSIS)
500.0000 mL | Freq: Once | INTRAVENOUS | Status: AC
  Administered 2012-11-23: 500 mL via INTRAVENOUS

## 2012-11-23 MED ORDER — METOPROLOL TARTRATE 1 MG/ML IV SOLN: 5.0000 mg | Freq: Four times a day (QID) | INTRAVENOUS | Status: DC

## 2012-11-23 MED ORDER — KETOROLAC TROMETHAMINE 30 MG/ML IJ SOLN
30.0000 mg | Freq: Once | INTRAMUSCULAR | Status: AC
  Administered 2012-11-23: 30 mg via INTRAVENOUS
  Filled 2012-11-23: qty 1

## 2012-11-23 MED ORDER — METOPROLOL TARTRATE 1 MG/ML IV SOLN
5.0000 mg | Freq: Once | INTRAVENOUS | Status: AC
  Administered 2012-11-23: 5 mg via INTRAVENOUS
  Filled 2012-11-23: qty 5

## 2012-11-23 MED ORDER — METOPROLOL TARTRATE 1 MG/ML IV SOLN
5.0000 mg | Freq: Four times a day (QID) | INTRAVENOUS | Status: DC
  Administered 2012-11-23 – 2012-11-30 (×27): 5 mg via INTRAVENOUS
  Filled 2012-11-23 (×34): qty 5

## 2012-11-23 MED ORDER — KETOROLAC TROMETHAMINE 15 MG/ML IJ SOLN: 15.0000 mg | Freq: Three times a day (TID) | INTRAMUSCULAR | Status: AC | PRN

## 2012-11-23 NOTE — Progress Notes (Signed)
1610 Patient's heart rate continues to stay 126-131 since 0400. Patient is resting. T98.6 HR 131 R18 B/P 136/86 Sats 97% on 2l/m Nasal cannula. 9604 Dr. Andrey Campanile notified of patient's elevated heart rate. Orders received. 0459 metoprolol 5mg  given IV. 0510 Heart rate at 112. Will continue to monitor.

## 2012-11-23 NOTE — Progress Notes (Signed)
Agree with above, will add scheduled toradol at 15 q 8 and recheck cr in am, need to get pain under better control this am.  I think tachycardia likely combination of that and volume status.  Discussed operative findings with him and wife again.

## 2012-11-23 NOTE — Progress Notes (Signed)
Inpatient Diabetes Program Recommendations  AACE/ADA: New Consensus Statement on Inpatient Glycemic Control (2013)  Target Ranges:  Prepandial:   less than 140 mg/dL      Peak postprandial:   less than 180 mg/dL (1-2 hours)      Critically ill patients:  140 - 180 mg/dL   Reason for Visit: Note CBG's greater than goal.  May consider adding Lantus 10 units daily.  Continue q 4 hour moderate correction.  A1C=8.2%, indicating that CBG's are greater than goal prior to admission.  Will need follow-up with PCP regarding diabetes management after hospitalization.

## 2012-11-23 NOTE — Progress Notes (Signed)
Patient ID: Theodore Flores, male   DOB: 1956-05-01, 57 y.o.   MRN: [id number] 1 Day Post-Op  Subjective: Pt c/o a lot of pain.  Doesn't feel like it's well controlled.  Hitting PCA every time " the button turns green."  No nausea.  No flatus  Objective: Vital signs in last 24 hours: Temp:  [96.6 F (35.9 C)-98.6 F (37 C)] 98.6 F (37 C) (04/08 0435) Pulse Rate:  [94-131] 131 (04/08 0435) Resp:  [11-25] 25 (04/08 0817) BP: (121-180)/(76-98) 136/86 mmHg (04/08 0435) SpO2:  [10 %-100 %] 99 % (04/08 0817) Last BM Date: 11/18/12  Intake/Output from previous day: 04/07 0701 - 04/08 0700 In: 3284.3 [I.V.:3227.3; NG/GT:57] Out: 2125 [Urine:1150; Emesis/NG output:950; Blood:25] Intake/Output this shift:    PE: Abd: soft, tender, -BS, incision is covered with dressing with is clean and dry.  NGT in place with some bilious output.   GU: foley in place with yellow urine.  Lab Results:   Recent Labs  11/22/12 0637 11/23/12 0642  WBC 7.9 9.5  HGB 10.2* 10.8*  HCT 29.4* 31.2*  PLT 215 246   BMET  Recent Labs  11/22/12 0637 11/23/12 0642  NA 137 138  K 4.2 5.0  CL 103 105  CO2 23 23  GLUCOSE 122* 164*  BUN 19 14  CREATININE 1.17 1.04  CALCIUM 8.8 8.7   PT/INR No results found for this basename: LABPROT, INR,  in the last 72 hours CMP     Component Value Date/Time   NA 138 11/23/2012 0642   NA 147* 07/29/2012 1011   K 5.0 11/23/2012 0642   K 4.2 07/29/2012 1011   CL 105 11/23/2012 0642   CL 108* 07/29/2012 1011   CO2 23 11/23/2012 0642   CO2 24 07/29/2012 1011   GLUCOSE 164* 11/23/2012 0642   GLUCOSE 131* 07/29/2012 1011   BUN 14 11/23/2012 0642   BUN 10.0 07/29/2012 1011   CREATININE 1.04 11/23/2012 0642   CREATININE 1.1 07/29/2012 1011   CREATININE 1.03 10/21/2011 1944   CALCIUM 8.7 11/23/2012 0642   CALCIUM 9.3 07/29/2012 1011   PROT 7.2 11/21/2012 1815   PROT 6.8 07/29/2012 1011   ALBUMIN 2.8* 11/21/2012 1815   ALBUMIN 3.5 07/29/2012 1011   AST 10 11/21/2012 1815   AST 16  07/29/2012 1011   ALT 12 11/21/2012 1815   ALT 29 07/29/2012 1011   ALKPHOS 56 11/21/2012 1815   ALKPHOS 63 07/29/2012 1011   BILITOT 0.2* 11/21/2012 1815   BILITOT 0.37 07/29/2012 1011   GFRNONAA 77* 11/23/2012 0642   GFRAA 90* 11/23/2012 0642   Lipase  No results found for this basename: lipase       Studies/Results: Ct Abdomen Pelvis W Contrast  11/21/2012  *RADIOLOGY REPORT*  Clinical Data: Abdominal pain.  Nausea and vomiting.  Diarrhea. Squamous cell carcinoma of the scrotum, undergoing chemotherapy and radiation therapy.  CT ABDOMEN AND PELVIS WITH CONTRAST  Technique:  Multidetector CT imaging of the abdomen and pelvis was performed following the standard protocol during bolus administration of intravenous contrast.  Contrast: OMNIPAQUE IOHEXOL 300 MG/ML  SOLN  Comparison: 08/06/2012  Findings: 3 mm nodule in the right lower lobe along the right hemidiaphragm merits observation, and appears stable from the recent PET CT.  There is mild atelectasis along the anterior margin of the left major fissure.  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  The malrotation is causing a small bowel obstruction due to twisting in the  distal duodenum or junction of the duodenum and jejunum, with transition point visible on image 42 of series 400.  Distended stomach and distended duodenum proximal to this point observed; the bowel is decompressed distal to this point.  Mild wall thickening is present in the distal esophagus, query mild esophagitis.  Subtle enhancement is observed in segment 5 of the liver inferiorly as shown on prior chest CT of 07/18/2012 - on PET CT there was a specific high activity in this vicinity.  Possible hemangioma but technically nonspecific.  Spleen and pancreas unremarkable.  Hypodense renal lesions are likely cysts.  Small right adrenal adenoma is again noted.  The appendix unremarkable.  Urinary bladder normal.  Soft tissue mass in the right scrotum compatible  with malignancy. This appears to have increased in size in the interim, with regional soft tissue density measuring proximally 4.7 x 9.9 cm, and more continuous and less nodular than on the prior exam.  IMPRESSION:  1.  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  Distended stomach and distended duodenum noted. 2.  Subjective increase in size of the right scrotal mass. 3.  Subtle enhancement in segment 5 of the liver as on prior chest CT.  Although this may well represent hemangioma, and was not obviously hypermetabolic on PET CT, this merits attention on follow- up studies. 4.  3 mm right basilar pulmonary nodule also merits observation. 5.  Bilateral renal cysts. 6.  Right adrenal adenoma. 7.  Suspected mild distal esophagitis.   Original Report Authenticated By: Gaylyn Rong, M.D.    Dg Abd 2 Views  11/22/2012  *RADIOLOGY REPORT*  Clinical Data: Small bowel obstruction, NG tube  ABDOMEN - 2 VIEW  Comparison: CT abdomen pelvis dated 11/21/2012  Findings: Enteric tube at the antropyloric region.  Mild gastric distension.  Air fluid levels in the right upper abdomen.  These findings of proximal small bowel obstruction are better demonstrated on recent CT.  No evidence of free air under the diaphragm on the upright view.  IMPRESSION: Enteric tube at the antropyloric region.   Original Report Authenticated By: Charline Bills, M.D.    Dg Abd Acute W/chest  11/21/2012  *RADIOLOGY REPORT*  Clinical Data: Small bowel obstruction.  ACUTE ABDOMEN SERIES (ABDOMEN 2 VIEW & CHEST 1 VIEW)  Comparison: CT abdomen and pelvis 10/11/2012.  Findings: The lung volumes are low.  Minimal atelectasis is evident at the lung base.  Heart size is normal.  Supine and upright views the abdomen demonstrate fluid levels within nondilated loops of small bowel in the mid abdomen.  The colonic gas pattern is normal.  IMPRESSION:  1.  Fluid levels within nondilated bowel  in the mid abdomen most compatible with a focal ileus.  Early small bowel obstruction is not excluded. 2.  Normal colonic gas pattern. 3.  Low lung volumes and left basilar atelectasis.   Original Report Authenticated By: Marin Roberts, M.D.     Anti-infectives: Anti-infectives   Start     Dose/Rate Route Frequency Ordered Stop   11/22/12 1200  [MAR Hold]  cefOXitin (MEFOXIN) 2 g in dextrose 5 % 50 mL IVPB     (On MAR Hold since 11/22/12 1152)   2 g 100 mL/hr over 30 Minutes Intravenous  Once 11/22/12 1121 11/22/12 1225       Assessment/Plan  1. S/p Ladd procedure for malrotation 2. Post op ileus 3. Squamous cell CA of scrotum, chemo via [facility] 4.  Tachycardia 5. HTN 6. DM   Plan: 1. Will d/c foley today as he has good UOP. 2. OOB and up to chair and mobilize in the halls 3. Cont with NGT to suction, await ileus resolution 4. Increase lopressor to 5mg  q 6h to help with increase HR and BP, hold parameters in place 5. Cont SSI for DM while NPO 6. Dressing changes to scrotum as written 7. Will give one dose of toradol to see if this helps with pain control. 8. Check labs in the am  LOS: 2 days    Herron Fero E 11/23/2012, 8:20 AM Pager: [phone]

## 2012-11-24 ENCOUNTER — Encounter (HOSPITAL_COMMUNITY): Payer: Self-pay | Admitting: General Surgery

## 2012-11-24 LAB — BASIC METABOLIC PANEL
BUN: 12 mg/dL (ref 6–23)
CO2: 23 mEq/L (ref 19–32)
Chloride: 108 mEq/L (ref 96–112)
Creatinine, Ser: 1.04 mg/dL (ref 0.50–1.35)
GFR calc Af Amer: 90 mL/min — ABNORMAL LOW (ref >90)
Potassium: 4.4 mEq/L (ref 3.5–5.1)

## 2012-11-24 LAB — GLUCOSE, CAPILLARY
Glucose-Capillary: 101 mg/dL — ABNORMAL HIGH (ref 70–99)
Glucose-Capillary: 112 mg/dL — ABNORMAL HIGH (ref 70–99)
Glucose-Capillary: 117 mg/dL — ABNORMAL HIGH (ref 70–99)
Glucose-Capillary: 124 mg/dL — ABNORMAL HIGH (ref 70–99)
Glucose-Capillary: 141 mg/dL — ABNORMAL HIGH (ref 70–99)

## 2012-11-24 MED ORDER — ACETAMINOPHEN 10 MG/ML IV SOLN
1000.0000 mg | Freq: Four times a day (QID) | INTRAVENOUS | Status: AC
  Administered 2012-11-24 – 2012-11-25 (×4): 1000 mg via INTRAVENOUS
  Filled 2012-11-24 (×4): qty 100

## 2012-11-24 MED ORDER — METHOCARBAMOL 100 MG/ML IJ SOLN
500.0000 mg | Freq: Three times a day (TID) | INTRAVENOUS | Status: DC | PRN
  Administered 2012-11-27: 1000 mg via INTRAVENOUS
  Filled 2012-11-24 (×2): qty 10

## 2012-11-24 NOTE — Progress Notes (Signed)
PT Cancellation Note  Patient Details Name: Theodore Flores MRN: [id number] DOB: 07-17-1956   Cancelled Treatment:    Reason Eval/Treat Not Completed: Other (comment) (pt just back to bed after being up most of the day)Will see in am  Discussed with RN possibility of abdominal binder to assist with pain management. Pt bothered by NG tube tape slipping off.    Ebony Hail [facility] 11/24/2012, 4:04 PM

## 2012-11-24 NOTE — Progress Notes (Signed)
2 Days Post-Op  Subjective: Theodore Flores is still in significant pain following his exploratory laparotomy, appendectomy and Ladds procedure. LADD surgery on 11/22/12.  His pain is located around the wound and in the RLQ/RUQ.  He is using his IV pain medication frequently.  Pt has not experienced any nausea and vomiting.  Pt is urinating into urinal; however he is not ambulating to the bathroom.  Upon entering the room, the patient was sitting in the chair; he is not ambulating otherwise.  He also has a NG tube in place and is NPO.    Objective: Vital signs in last 24 hours: Temp:  [98.2 F (36.8 C)-100.2 F (37.9 C)] 99.5 F (37.5 C) (04/09 0459) Pulse Rate:  [116-123] 122 (04/09 0459) Resp:  [14-21] 14 (04/09 0758) BP: (102-157)/(57-81) 157/81 mmHg (04/09 0459) SpO2:  [97 %-100 %] 100 % (04/09 0758) Last BM Date: 11/18/12  Intake/Output from previous day: 04/08 0701 - 04/09 0700 In: 2945 [I.V.:2875; NG/GT:70] Out: 2550 [Urine:850; Emesis/NG output:1700] Intake/Output this shift:    PE: Gen:  Alert, NAD, pleasant, slightly disoriented due to narcotics Card:  RRR, no M/G/R heard Pulm:  CTA, no W/R/R; on IS Abd: Soft, nondistended, tenderness around the incision itself, RLQ, RUQ, -BS, no HSM, incisions C/D/I; NG output 1700 Ext:  No erythema, edema, or tenderness   Lab Results:   Recent Labs  11/22/12 0637 11/23/12 0642  WBC 7.9 9.5  HGB 10.2* 10.8*  HCT 29.4* 31.2*  PLT 215 246   BMET  Recent Labs  11/23/12 0642 11/24/12 0538  NA 138 142  K 5.0 4.4  CL 105 108  CO2 23 23  GLUCOSE 164* 134*  BUN 14 12  CREATININE 1.04 1.04  CALCIUM 8.7 8.7   PT/INR No results found for this basename: LABPROT, INR,  in the last 72 hours CMP     Component Value Date/Time   NA 142 11/24/2012 0538   NA 147* 07/29/2012 1011   K 4.4 11/24/2012 0538   K 4.2 07/29/2012 1011   CL 108 11/24/2012 0538   CL 108* 07/29/2012 1011   CO2 23 11/24/2012 0538   CO2 24 07/29/2012 1011   GLUCOSE 134* 11/24/2012 0538   GLUCOSE 131* 07/29/2012 1011   BUN 12 11/24/2012 0538   BUN 10.0 07/29/2012 1011   CREATININE 1.04 11/24/2012 0538   CREATININE 1.1 07/29/2012 1011   CREATININE 1.03 10/21/2011 1944   CALCIUM 8.7 11/24/2012 0538   CALCIUM 9.3 07/29/2012 1011   PROT 7.2 11/21/2012 1815   PROT 6.8 07/29/2012 1011   ALBUMIN 2.8* 11/21/2012 1815   ALBUMIN 3.5 07/29/2012 1011   AST 10 11/21/2012 1815   AST 16 07/29/2012 1011   ALT 12 11/21/2012 1815   ALT 29 07/29/2012 1011   ALKPHOS 56 11/21/2012 1815   ALKPHOS 63 07/29/2012 1011   BILITOT 0.2* 11/21/2012 1815   BILITOT 0.37 07/29/2012 1011   GFRNONAA 77* 11/24/2012 0538   GFRAA 90* 11/24/2012 0538   Lipase  No results found for this basename: lipase       Studies/Results: No results found.  Anti-infectives: Anti-infectives   Start     Dose/Rate Route Frequency Ordered Stop   11/22/12 1200  [MAR Hold]  cefOXitin (MEFOXIN) 2 g in dextrose 5 % 50 mL IVPB     (On MAR Hold since 11/22/12 1152)   2 g 100 mL/hr over 30 Minutes Intravenous  Once 11/22/12 1121 11/22/12 1225       Assessment/Plan  1. Malrotation with small bowel obstruction; s/p; POD2  #1 exploratory laparotomy   #2 appendectomy   #3 Ladds procedure 2. Post op ileus  3. Squamous cell CA of scrotum, chemo via [facility]  4. Tachycardia  5. HTN  6. DM   Plan: 1. Continue IS 2. VTE: SCDs, PT/OT consult 3. Added Robaxin and IV Tylenol; Toradol will be d/c after today; continue Dilaudid PCA 4. Mobilize in the halls 5. Cont with NGT to suction, await ileus resolution 6. Cont SSI for DM while NPO 7. Cont lopressor for HR/BP 8. Dressing changes to scrotum as written 9. Check labs in AM   LOS: 3 days    Cecile Hearing 11/24/2012, 10:25 AM [phone]

## 2012-11-24 NOTE — Progress Notes (Signed)
Agree with above 

## 2012-11-25 LAB — BASIC METABOLIC PANEL
Calcium: 8.7 mg/dL (ref 8.4–10.5)
GFR calc Af Amer: >90 mL/min (ref >90)
GFR calc non Af Amer: 90 mL/min — ABNORMAL LOW (ref >90)
Potassium: 4 mEq/L (ref 3.5–5.1)
Sodium: 142 mEq/L (ref 135–145)

## 2012-11-25 LAB — CBC
Hemoglobin: 9.2 g/dL — ABNORMAL LOW (ref 13.0–17.0)
MCH: 30 pg (ref 26.0–34.0)
MCHC: 33.9 g/dL (ref 30.0–36.0)
Platelets: 212 10*3/uL (ref 150–400)
RBC: 3.07 MIL/uL — ABNORMAL LOW (ref 4.22–5.81)

## 2012-11-25 LAB — GLUCOSE, CAPILLARY: Glucose-Capillary: 121 mg/dL — ABNORMAL HIGH (ref 70–99)

## 2012-11-25 MED ORDER — KCL IN DEXTROSE-NACL 20-5-0.45 MEQ/L-%-% IV SOLN
INTRAVENOUS | Status: DC
  Administered 2012-11-25 – 2012-11-29 (×5): via INTRAVENOUS
  Filled 2012-11-25 (×7): qty 1000

## 2012-11-25 NOTE — Evaluation (Signed)
Occupational Therapy Evaluation Patient Details Name: Theodore Flores MRN: [id number] DOB: [birth year] Today's Date: 11/25/2012 Time: 7829-5621 OT Time Calculation (min): 16 min  OT Assessment / Plan / Recommendation Clinical Impression  Pt is a 57 yo male admitted with h/o scrotal cancer and has been undergoing chemo and now was admitted for SBO with surgery and has the deficits listed below.  Pt would benefit from cont OT to increase I with LE adls so he can d/c home I with his wife.     OT Assessment  Patient needs continued OT Services    Follow Up Recommendations  No OT follow up    Barriers to Discharge None    Equipment Recommendations  None recommended by OT    Recommendations for Other Services    Frequency  Min 2X/week    Precautions / Restrictions Precautions Precautions: Fall Precaution Comments: lines/tubes Restrictions Weight Bearing Restrictions: No   Pertinent Vitals/Pain Pt with 7/10 pain.  Pt with PCA.  Pts O2 sat on RA were 97%.    ADL  Eating/Feeding: Simulated;Set up Where Assessed - Eating/Feeding: Chair Grooming: Simulated;Set up Where Assessed - Grooming: Supported standing Upper Body Bathing: Simulated;Set up Where Assessed - Upper Body Bathing: Unsupported sitting Lower Body Bathing: Simulated;Moderate assistance Where Assessed - Lower Body Bathing: Supported sit to stand Upper Body Dressing: Simulated;Set up Where Assessed - Upper Body Dressing: Unsupported sitting Lower Body Dressing: Performed;Moderate assistance Where Assessed - Lower Body Dressing: Supported sit to stand Toilet Transfer: Performed;Min Pension scheme manager Method: Stand pivot;Sit to Barista: Comfort height toilet;Grab bars Toileting - Architect and Hygiene: Simulated;Supervision/safety Where Assessed - Engineer, mining and Hygiene: Standing Equipment Used: Rolling walker Transfers/Ambulation Related to ADLs: Pt  transferred in room with min guard adn walked in hallway with min guard to S. ADL Comments: Pt has most difficulty with LE adls b/c he cannot reach his feet due to pain in abdominal area.    OT Diagnosis: Generalized weakness;Acute pain  OT Problem List: Decreased knowledge of use of DME or AE;Pain OT Treatment Interventions: Self-care/ADL training;DME and/or AE instruction   OT Goals Acute Rehab OT Goals OT Goal Formulation: With patient Time For Goal Achievement: 12/02/12 Potential to Achieve Goals: Good ADL Goals Pt Will Perform Lower Body Bathing: with modified independence;Sit to stand from chair ADL Goal: Lower Body Bathing - Progress: Goal set today Pt Will Perform Lower Body Dressing: with modified independence;with adaptive equipment ADL Goal: Lower Body Dressing - Progress: Goal set today Pt Will Perform Tub/Shower Transfer: Shower transfer;with modified independence ADL Goal: Tub/Shower Transfer - Progress: Goal set today  Visit Information  Last OT Received On: 11/25/12 Assistance Needed: +1 PT/OT Co-Evaluation/Treatment: Yes    Subjective Data  Subjective: "I only hurt in my stomach." Patient Stated Goal: to get home.   Prior Functioning     Home Living Lives With: Spouse Available Help at Discharge: Family Type of Home: House Home Access: Level entry Home Layout: One level Bathroom Shower/Tub: Walk-in shower;Tub/shower unit Bathroom Toilet: Standard Home Adaptive Equipment: Other (comment) Additional Comments: walking stick Prior Function Level of Independence: Independent Able to Take Stairs?: Yes Driving: Yes Vocation: Full time employment Comments: on leave at work. Communication Communication: No difficulties Dominant Hand: Right         Vision/Perception Vision - History Baseline Vision: No visual deficits Patient Visual Report: No change from baseline Vision - Assessment Eye Alignment: Within Functional Limits Vision Assessment:  Vision not tested  Cognition  Cognition Overall Cognitive Status: Appears within functional limits for tasks assessed/performed Arousal/Alertness: Awake/alert Orientation Level: Oriented X4 / Intact Behavior During Session: [facility] for tasks performed Cognition - Other Comments: grossly intact.    Extremity/Trunk Assessment Right Upper Extremity Assessment RUE ROM/Strength/Tone: Within functional levels RUE Sensation: WFL - Light Touch RUE Coordination: WFL - gross/fine motor Left Upper Extremity Assessment LUE ROM/Strength/Tone: Within functional levels LUE Sensation: WFL - Light Touch LUE Coordination: WFL - gross/fine motor Trunk Assessment Trunk Assessment: Normal     Mobility Bed Mobility Bed Mobility: Rolling Right;Right Sidelying to Sit;Sitting - Scoot to Edge of Bed Rolling Right: 5: Supervision;With rail Right Sidelying to Sit: 4: Min guard Sitting - Scoot to [place] of Bed: 5: Supervision Transfers Transfers: Sit to Stand;Stand to Sit Sit to Stand: 4: Min guard Stand to Sit: 4: Min guard Details for Transfer Assistance: cues for hand position when sitting and standing.     Exercise     Balance Balance Balance Assessed: No   End of Session OT - End of Session Activity Tolerance: Patient tolerated treatment well Patient left: in chair;with call bell/phone within reach;with family/visitor present Nurse Communication: Mobility status  GO     Hope Budds 11/25/2012, 10:09 AM [phone]

## 2012-11-25 NOTE — Progress Notes (Signed)
3 Days Post-Op  Subjective: Mr. Reierson is sitting up in the chair.  His wife is at bedside.  He is not in any acute distress.  He reports just coming back from a walk with physical therapy.  He feels like his pain is under much better control today.  He was able to walk without any difficulties.  The patient states he feels like he can have a bowel movement, passing flatus.  He is consuming ice chips.     Objective: Vital signs in last 24 hours: Temp:  [98.1 F (36.7 C)-98.4 F (36.9 C)] 98.2 F (36.8 C) (04/10 0946) Pulse Rate:  [99-117] 115 (04/10 0946) Resp:  [10-23] 18 (04/10 0946) BP: (124-166)/(73-90) 141/85 mmHg (04/10 0946) SpO2:  [95 %-100 %] 97 % (04/10 0946) Last BM Date: 11/18/12  Intake/Output from previous day: 04/09 0701 - 04/10 0700 In: 1324 [I.V.:1204] Out: 3375 [Urine:875; Emesis/NG output:2500] Intake/Output this shift: Total I/O In: -  Out: 300 [Urine:300]  General appearance: alert, cooperative and no distress Resp: clear to auscultation bilaterally Cardio: regular rate and rhythm, S1, S2 normal, no murmur, click, rub or gallop and tachycardic GI: soft, round, obese, no masses or hernias.  No organomegaly.  Incision clean dry and intact.  Hypoactive bowel sounds. Male genitalia: normal, dressing dry and intact-scrotum  Lab Results:   Recent Labs  11/23/12 0642 11/25/12 0630  WBC 9.5 8.9  HGB 10.8* 9.2*  HCT 31.2* 27.1*  PLT 246 212   BMET  Recent Labs  11/24/12 0538 11/25/12 0630  NA 142 142  K 4.4 4.0  CL 108 109  CO2 23 23  GLUCOSE 134* 111*  BUN 12 11  CREATININE 1.04 0.96  CALCIUM 8.7 8.7    Studies/Results: No results found.  Anti-infectives: Anti-infectives   Start     Dose/Rate Route Frequency Ordered Stop   11/22/12 1200  [MAR Hold]  cefOXitin (MEFOXIN) 2 g in dextrose 5 % 50 mL IVPB     (On MAR Hold since 11/22/12 1152)   2 g 100 mL/hr over 30 Minutes Intravenous  Once 11/22/12 1121 11/22/12 1225       Assessment/Plan: S/p Ladd procedure for malrotation w/ post op ileus(Dr. Dwain Sarna 11/22/12) -improving. -Clamp NGT. -Ambulate in halls. -Continue with IS -maintain adequate pain control. -Continue with IV hydration while NPO -Will need to arrange follow up in clinic for staples removal.  Hypertension and tachycardia -continue with IVP metoprolol. -Telemetry monitor.  Diabetes Mellitus -sliding scale and QID BGs -PCP follow up upon discharge.  Squamous cell CA of scrotum, chemo via [facility] -dressing changes  VTE prophylaxis -SCD and lovenox    LOS: 4 days    Cayli Escajeda ANP-BC 11/25/2012

## 2012-11-25 NOTE — Evaluation (Signed)
Physical Therapy Evaluation Patient Details Name: Theodore Flores MRN: [id number] DOB: 1956-05-01 Today's Date: 11/25/2012 Time: 8657-8469 PT Time Calculation (min): 41 min  PT Assessment / Plan / Recommendation Clinical Impression  Pt adm with malrotation with bowel obstruction and underwent surgery on 11/22/12.  Pt also undergoing treatment at [facility] for scrotal CA. Needs skilled PT to maximize I and safety so pt can return home with wife.  Should make good progress with mobility.    PT Assessment  Patient needs continued PT services    Follow Up Recommendations  No PT follow up    Does the patient have the potential to tolerate intense rehabilitation      Barriers to Discharge        Equipment Recommendations  Rolling walker with 5" wheels (depending on progress)    Recommendations for Other Services     Frequency Min 3X/week    Precautions / Restrictions Precautions Precautions: Fall Precaution Comments: lines/tubes Restrictions Weight Bearing Restrictions: No   Pertinent Vitals/Pain See flow sheet.      Mobility  Bed Mobility Bed Mobility: Rolling Right;Right Sidelying to Sit;Sitting - Scoot to Edge of Bed Rolling Right: 5: Supervision;With rail Right Sidelying to Sit: 4: Min guard Sitting - Scoot to Edge of Bed: 5: Supervision Transfers Sit to Stand: 4: Min guard Stand to Sit: 4: Min guard Details for Transfer Assistance: cues for hand position when sitting and standing. Ambulation/Gait Ambulation/Gait Assistance: 4: Min guard Ambulation Distance (Feet): 500 Feet Assistive device: Rolling walker Ambulation/Gait Assistance Details: Verbal cues to look up and stand more erect Gait Pattern: Step-through pattern;Decreased stride length;Trunk flexed Gait velocity: decr    Exercises     PT Diagnosis: Difficulty walking;Acute pain  PT Problem List: Decreased activity tolerance;Decreased mobility;Pain;Decreased knowledge of use of DME PT Treatment Interventions:  DME instruction;Gait training;Patient/family education;Functional mobility training;Therapeutic activities;Therapeutic exercise;Balance training   PT Goals Acute Rehab PT Goals PT Goal Formulation: With patient Time For Goal Achievement: 12/02/12 Potential to Achieve Goals: Good Pt will go Supine/Side to Sit: with modified independence PT Goal: Supine/Side to Sit - Progress: Goal set today Pt will go Sit to Supine/Side: with modified independence PT Goal: Sit to Supine/Side - Progress: Goal set today Pt will go Sit to Stand: with modified independence PT Goal: Sit to Stand - Progress: Goal set today Pt will go Stand to Sit: with modified independence PT Goal: Stand to Sit - Progress: Goal set today Pt will Ambulate: >150 feet;with modified independence;with least restrictive assistive device PT Goal: Ambulate - Progress: Goal set today  Visit Information  Last PT Received On: 11/25/12 Assistance Needed: +1    Subjective Data  Subjective: Pt states it feels good to walk. Patient Stated Goal: Return home.   Prior Functioning  Home Living Lives With: Spouse Available Help at Discharge: Family Type of Home: House Home Access: Level entry Home Layout: One level Bathroom Shower/Tub: Walk-in shower;Tub/shower unit Bathroom Toilet: Standard Home Adaptive Equipment: Other (comment) Additional Comments: walking stick Prior Function Level of Independence: Independent Able to Take Stairs?: Yes Driving: Yes Vocation: Full time employment Comments: on leave at work. Communication Communication: No difficulties Dominant Hand: Right    Cognition  Cognition Overall Cognitive Status: Appears within functional limits for tasks assessed/performed Arousal/Alertness: Awake/alert Orientation Level: Oriented X4 / Intact Behavior During Session: [facility] for tasks performed Cognition - Other Comments: grossly intact.    Extremity/Trunk Assessment Right Upper Extremity Assessment RUE  ROM/Strength/Tone: Within functional levels RUE Sensation: WFL - Light  Touch RUE Coordination: WFL - gross/fine motor Left Upper Extremity Assessment LUE ROM/Strength/Tone: Within functional levels LUE Sensation: WFL - Light Touch LUE Coordination: WFL - gross/fine motor Right Lower Extremity Assessment RLE ROM/Strength/Tone: WFL for tasks assessed Left Lower Extremity Assessment LLE ROM/Strength/Tone: WFL for tasks assessed Trunk Assessment Trunk Assessment: Normal   Balance Balance Balance Assessed: Yes Static Standing Balance Static Standing - Balance Support: Bilateral upper extremity supported Static Standing - Level of Assistance: 6: Modified independent (Device/Increase time)  End of Session PT - End of Session Activity Tolerance: Patient tolerated treatment well Patient left: in chair;with call bell/phone within reach;with family/visitor present Nurse Communication: Mobility status  GP     [facility] 11/25/2012, 10:30 AM  [employer] PT [phone]

## 2012-11-25 NOTE — Progress Notes (Signed)
Orthopedic Tech Progress Note Patient Details:  Theodore Flores 05-31-1956 [id number]  Ortho Devices Type of Ortho Device: Abdominal binder   Shawnie Pons 11/25/2012, 2:07 PM

## 2012-11-25 NOTE — Progress Notes (Signed)
Agree with above, he appears to be resolving ileus and will clamp ng today, much of output I think is ice.

## 2012-11-26 ENCOUNTER — Inpatient Hospital Stay (HOSPITAL_COMMUNITY): Payer: 59

## 2012-11-26 LAB — BASIC METABOLIC PANEL
BUN: 10 mg/dL (ref 6–23)
Chloride: 109 mEq/L (ref 96–112)
Creatinine, Ser: 0.96 mg/dL (ref 0.50–1.35)
GFR calc Af Amer: >90 mL/min (ref >90)
Glucose, Bld: 139 mg/dL — ABNORMAL HIGH (ref 70–99)
Potassium: 3.7 mEq/L (ref 3.5–5.1)

## 2012-11-26 LAB — GLUCOSE, CAPILLARY: Glucose-Capillary: 142 mg/dL — ABNORMAL HIGH (ref 70–99)

## 2012-11-26 MED ORDER — TRACE MINERALS CR-CU-F-FE-I-MN-MO-SE-ZN IV SOLN
INTRAVENOUS | Status: AC
  Administered 2012-11-26: 20:00:00 via INTRAVENOUS
  Filled 2012-11-26: qty 1000

## 2012-11-26 MED ORDER — SODIUM CHLORIDE 0.9 % IJ SOLN
10.0000 mL | INTRAMUSCULAR | Status: DC | PRN
  Administered 2012-11-26 – 2012-12-01 (×4): 10 mL

## 2012-11-26 MED ORDER — FAT EMULSION 20 % IV EMUL
250.0000 mL | INTRAVENOUS | Status: AC
  Administered 2012-11-26: 250 mL via INTRAVENOUS
  Filled 2012-11-26: qty 250

## 2012-11-26 NOTE — Progress Notes (Signed)
He was passing flatus but not today.  Felt worse with ng clamped.  Will check kub today.  Start tna.  I talked to his oncologist Dr. Malena Peer at [facility] today and updated him.

## 2012-11-26 NOTE — Progress Notes (Signed)
PARENTERAL NUTRITION CONSULT NOTE - INITIAL  Pharmacy Consult for TPN Indication: intolerance to oral feedings  No Known Allergies  Patient Measurements: Height: 6' (182.9 cm) Weight: 245 lb (111.131 kg) IBW/kg (Calculated) : 77.6  Vital Signs: Temp: 100.1 F (37.8 C) (04/11 0951) Temp src: Oral (04/11 0951) BP: 152/86 mmHg (04/11 0951) Pulse Rate: 107 (04/11 0951) Intake/Output from previous day: 04/10 0701 - 04/11 0700 In: 480 [P.O.:180; I.V.:300] Out: 1350 [Urine:1350] Intake/Output from this shift:    Labs:  Recent Labs  11/25/12 0630  WBC 8.9  HGB 9.2*  HCT 27.1*  PLT 212     Recent Labs  11/24/12 0538 11/25/12 0630 11/26/12 0500  NA 142 142 143  K 4.4 4.0 3.7  CL 108 109 109  CO2 23 23 25   GLUCOSE 134* 111* 139*  BUN 12 11 10   CREATININE 1.04 0.96 0.96  CALCIUM 8.7 8.7 8.8   Estimated Creatinine Clearance: 108 ml/min (by C-G formula based on Cr of 0.96).    Recent Labs  11/25/12 2329 11/26/12 0332 11/26/12 0726  GLUCAP 127* 128* 132*    Medical History: Past Medical History  Diagnosis Date  . Diabetes mellitus without complication   . Hypertension   . Mass of right inguinal region   . Squamous cell carcinoma   . Adrenal adenoma   . Sigmoid diverticulitis   . Skin cancer     Medications:  Prescriptions prior to admission  Medication Sig Dispense Refill  . docusate sodium (COLACE) 100 MG capsule Take 100 mg by mouth 2 (two) times daily.      Marland Kitchen HYDROcodone-acetaminophen (NORCO/VICODIN) 5-325 MG per tablet Take 1-2 tablets by mouth every 6 (six) hours as needed.  30 tablet  0  . lisinopril (PRINIVIL,ZESTRIL) 20 MG tablet Take 1 tablet (20 mg total) by mouth daily.  30 tablet  11  . metFORMIN (GLUCOPHAGE) 1000 MG tablet Take 1 tablet (1,000 mg total) by mouth 2 (two) times daily with a meal.  60 tablet  11  . metoprolol tartrate (LOPRESSOR) 12.5 mg TABS Take 0.5 tablets (12.5 mg total) by mouth 2 (two) times daily.  30 tablet  0  .  morphine (MSIR) 15 MG tablet Take 15 mg by mouth every 4 (four) hours as needed for pain.      Marland Kitchen nystatin (MYCOSTATIN) powder Apply 1 g topically 4 (four) times daily.      . ondansetron (ZOFRAN) 4 MG tablet Take 4 mg by mouth every 8 (eight) hours as needed for nausea.      Marland Kitchen oxyCODONE (OXY IR/ROXICODONE) 5 MG immediate release tablet Take 5-10 mg by mouth every 4 (four) hours as needed for pain.      . promethazine (PHENERGAN) 25 MG tablet Take 25 mg by mouth every 6 (six) hours as needed for nausea.      Marland Kitchen senna (SENOKOT) 8.6 MG TABS Take 1 tablet by mouth 2 (two) times daily.       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 ) GI: NG tube clamped yesterday but hooked back up around 2am, nothing from NG so now reclamped, no flatus noted, post op ileus Endo: h/o DM  Metformin PTA  cbgs 127-132 on ssi Lytes: K 3.7 Renal: SrCr 0.96  Pulm: 1.5L02 Cards: HTN   Lisinopril and lopressor PTA    BP 152/86  HR 107 on scheduled IV lopresor, prn hydralazine Hepatobil: Baseline LFTs  OK Neuro: on PCA ID:   No Abx  Tmax 100.1  WBC 8.9 Best Practices: lovenox, ppi TPN Access: PICC line ordered for 4/11 TPN day#:0  Insulin Requirements in the past 24 hours:  6 units  Current Nutrition:  NPO  Assessment: 57 yo man who has been NPO for several days to start TPN  Nutritional Goals:  Will f/u RD recommendations  Plan:  Start Clinimix E 5/15 at 40 ml/hr MVI, TE and lipids on MWF only due to national backorder. F/u am labs.  Theodore Flores 11/26/2012,10:57 AM

## 2012-11-26 NOTE — Progress Notes (Signed)
Pt awake c/o severe abd pain and nausea Pt not using pca abd binder opened  Ngt checked for placement , placed to suction min output left on, pain deceased pt using pca  Will continue to monitor

## 2012-11-26 NOTE — Progress Notes (Signed)
4 Days Post-Op  Subjective: Wants all these tubes out he hurts and is generally tired of all this stuff. No flatus yet, they clamped his NG yesterday but hooked it back up around 2 AM because he felt bloated.  Nothing from NG since it was hooked up.  I irrigated it myself, and it is working.  Objective: Vital signs in last 24 hours: Temp:  [98.2 F (36.8 C)-99.7 F (37.6 C)] 98.2 F (36.8 C) (04/11 0508) Pulse Rate:  [105-119] 119 (04/11 0508) Resp:  [13-25] 13 (04/11 0508) BP: (128-148)/(73-93) 142/87 mmHg (04/11 0508) SpO2:  [97 %-100 %] 100 % (04/11 0508) Last BM Date: 11/25/12 180 PO, afebrile, tachycardic, BP up some too. BMP OK,  Diet: NPO Intake/Output from previous day: 04/10 0701 - 04/11 0700 In: 480 [P.O.:180; I.V.:300] Out: 1350 [Urine:1350] Intake/Output this shift:    General appearance: alert, cooperative and no distress Resp: clear to auscultation bilaterally GI: soft, sore, but not distended, + BS, incision looks good Male genitalia: Open area Right groin is malordorus and supprative.  I changed the dressing.    Lab Results:   Recent Labs  11/25/12 0630  WBC 8.9  HGB 9.2*  HCT 27.1*  PLT 212    BMET  Recent Labs  11/25/12 0630 11/26/12 0500  NA 142 143  K 4.0 3.7  CL 109 109  CO2 23 25  GLUCOSE 111* 139*  BUN 11 10  CREATININE 0.96 0.96  CALCIUM 8.7 8.8   PT/INR No results found for this basename: LABPROT, INR,  in the last 72 hours   Recent Labs Lab 11/21/12 1815  AST 10  ALT 12  ALKPHOS 56  BILITOT 0.2*  PROT 7.2  ALBUMIN 2.8*     Lipase  No results found for this basename: lipase     Studies/Results: No results found.  Medications: . antiseptic oral rinse  15 mL Mouth Rinse q12n4p  . enoxaparin (LOVENOX) injection  40 mg Subcutaneous Daily  . HYDROmorphone PCA 0.3 mg/mL   Intravenous Q4H  . insulin aspart  0-15 Units Subcutaneous Q4H  . metoprolol  5 mg Intravenous Q6H  . pantoprazole (PROTONIX) IV  40 mg  Intravenous QHS    Assessment/Plan S/p Ladd procedure for malrotation w/ post op ileus(Dr. Dwain Sarna 11/22/12) Hypertension and tachycardia Diabetes Mellitus Squamous cell CA of scrotum, chemo via [facility] VTE prophylaxis Mild anemia   Plan:  No flatus, or BM he's aware of .  Nothing recorded, I will try clamping the NG.  I told him once he knows he's having flatus we will take out the NG.  He's having allot of discomfort, mostly from the  NG, but also the PCA nasal cannula.  I told him that's the price for having the PCA. Work on mobilizing him more.  Increase wet to dry dressing to right scrotum.. I thought about Hydro Rx, but I'm not sure he can handle that at this point.    LOS: 5 days    Theodore Flores 11/26/2012

## 2012-11-26 NOTE — Progress Notes (Signed)
NUTRITION FOLLOW UP  Intervention:   1.  Parenteral nutrition; per PharmD management 2.  Modify diet; per MD discretion  Nutrition Dx:   Inadequate oral intake, ongoing  Monitor:   1. Food/Beverage; resume of PO diet once medically appropriate.  Not met, pt unable to tolerate clamped NGT 2.  Parenteral nutrition; initiation with tolerance 3.  Wt/wt change; monitor trends.  Assessment:   Pt admitted with abdominal pain, nausea, and vomiting r/t to malrotation with obstruction.  Pt currently undergoing chemoradiation for SCC of scrotum.  Pt to start TPN for ongoing ileus and inability to tolerate clamped NGT. Patient has been ordered TPN with Clinimix E 5/15 @ 40 ml/hr.  Lipids (20% IVFE @ 10 ml/hr), multivitamins, and trace elements are provided 3 times weekly (MWF) due to national backorder.  Provides 887 kcal and 48 grams protein daily (based on weekly average).  Meets 40% minimum estimated kcal and 48% minimum estimated protein needs. Discussed nutrition with pt and wife who reports no intake in 8 days.  Explained current nutrition-related plan.  Pt is very focused on trying to produce a BM.  Discussed ways he can naturally encourage return of bowel function.   Height: Ht Readings from Last 1 Encounters:  11/21/12 6' (1.829 m)    Weight Status:   Wt Readings from Last 1 Encounters:  11/21/12 245 lb (111.131 kg)    Re-estimated needs:  Kcal: 2200-2500 Protein: 100-120 Fluid: >2.2 L/day  Skin: incision  Diet Order: NPO   Intake/Output Summary (Last 24 hours) at 11/26/12 1235 Last data filed at 11/25/12 2241  Gross per 24 hour  Intake    360 ml  Output    800 ml  Net   -440 ml    Last BM: 4/5   Labs:   Recent Labs Lab 11/24/12 0538 11/25/12 0630 11/26/12 0500  NA 142 142 143  K 4.4 4.0 3.7  CL 108 109 109  CO2 23 23 25   BUN 12 11 10   CREATININE 1.04 0.96 0.96  CALCIUM 8.7 8.7 8.8  GLUCOSE 134* 111* 139*    CBG (last 3)   Recent Labs   11/26/12 0332 11/26/12 0726 11/26/12 1230  GLUCAP 128* 132* 142*    Scheduled Meds: . antiseptic oral rinse  15 mL Mouth Rinse q12n4p  . enoxaparin (LOVENOX) injection  40 mg Subcutaneous Daily  . HYDROmorphone PCA 0.3 mg/mL   Intravenous Q4H  . insulin aspart  0-15 Units Subcutaneous Q4H  . metoprolol  5 mg Intravenous Q6H  . pantoprazole (PROTONIX) IV  40 mg Intravenous QHS    Continuous Infusions: . dextrose 5 % and 0.45 % NaCl with KCl 20 mEq/L 50 mL/hr at 11/26/12 1118  . Marland KitchenTPN (CLINIMIX-E) Adult     And  . fat emulsion      Loyce Dys, MS RD LDN Clinical Inpatient Dietitian Pager: [phone] Weekend/After hours pager: [phone]

## 2012-11-26 NOTE — Progress Notes (Signed)
Peripherally Inserted Central Catheter/Midline Placement  The IV Nurse has discussed with the patient and/or persons authorized to consent for the patient, the purpose of this procedure and the potential benefits and risks involved with this procedure.  The benefits include less needle sticks, lab draws from the catheter and patient may be discharged home with the catheter.  Risks include, but not limited to, infection, bleeding, blood clot (thrombus formation), and puncture of an artery; nerve damage and irregular heat beat.  Alternatives to this procedure were also discussed.  Consent obtained from wife due to patient's lethargy.  PICC/Midline Placement Documentation  PICC / Midline Double Lumen 11/26/12 PICC Right Brachial (Active)       Avyukth Bontempo, Lajean Manes 11/26/2012, 6:52 PM

## 2012-11-27 LAB — COMPREHENSIVE METABOLIC PANEL
ALT: 10 U/L (ref 0–53)
AST: 12 U/L (ref 0–37)
Alkaline Phosphatase: 58 U/L (ref 39–117)
CO2: 28 mEq/L (ref 19–32)
Chloride: 107 mEq/L (ref 96–112)
GFR calc non Af Amer: >90 mL/min (ref >90)
Sodium: 142 mEq/L (ref 135–145)
Total Bilirubin: 0.2 mg/dL — ABNORMAL LOW (ref 0.3–1.2)

## 2012-11-27 LAB — DIFFERENTIAL
Lymphs Abs: 0.3 10*3/uL — ABNORMAL LOW (ref 0.7–4.0)
Monocytes Relative: 5 % (ref 3–12)
Neutro Abs: 6.4 10*3/uL (ref 1.7–7.7)
Neutrophils Relative %: 89 % — ABNORMAL HIGH (ref 43–77)

## 2012-11-27 LAB — GLUCOSE, CAPILLARY
Glucose-Capillary: 152 mg/dL — ABNORMAL HIGH (ref 70–99)
Glucose-Capillary: 156 mg/dL — ABNORMAL HIGH (ref 70–99)
Glucose-Capillary: 179 mg/dL — ABNORMAL HIGH (ref 70–99)

## 2012-11-27 LAB — CBC
MCV: 87.9 fL (ref 78.0–100.0)
Platelets: 208 10*3/uL (ref 150–400)
RBC: 2.98 MIL/uL — ABNORMAL LOW (ref 4.22–5.81)
WBC: 7.2 10*3/uL (ref 4.0–10.5)

## 2012-11-27 MED ORDER — MAGNESIUM SULFATE 40 MG/ML IJ SOLN
2.0000 g | Freq: Once | INTRAMUSCULAR | Status: AC
  Administered 2012-11-27: 2 g via INTRAVENOUS
  Filled 2012-11-27: qty 50

## 2012-11-27 MED ORDER — INSULIN REGULAR HUMAN 100 UNIT/ML IJ SOLN
INTRAVENOUS | Status: AC
  Administered 2012-11-27: 18:00:00 via INTRAVENOUS
  Filled 2012-11-27: qty 2000

## 2012-11-27 NOTE — Progress Notes (Signed)
5 Days Post-Op  Subjective: No real change, except some flatus  Objective: Vital signs in last 24 hours: Temp:  [98.9 F (37.2 C)-100.1 F (37.8 C)] 98.9 F (37.2 C) (04/12 0551) Pulse Rate:  [98-107] 104 (04/12 0551) Resp:  [12-21] 21 (04/12 0551) BP: (133-152)/(73-91) 140/75 mmHg (04/12 0551) SpO2:  [97 %-100 %] 99 % (04/12 0551) Last BM Date: 11/20/12 Film yesterday suggest NG be advanced,+ ileus, Only 50 from NG recorded TM 99.9, VSS, he's a little tachycardic, labs stable Diet: NPO, starting TNA, On PCA Intake/Output from previous day: 2022-12-11 0701 - 04/12 0700 In: 1146.7 [I.V.:1146.7] Out: 600 [Urine:550; Emesis/NG output:50] Intake/Output this shift:    General appearance: alert, cooperative and no distress Resp: clear to auscultation bilaterally and down some in the bases GI: hypoactive BS, distended, some. some flatus.  incision looks good. Scrotum: No change  Lab Results:   Recent Labs  11/25/12 0630 11/27/12 0600  WBC 8.9 7.2  HGB 9.2* 8.7*  HCT 27.1* 26.2*  PLT 212 208    BMET  Recent Labs  December 10, 2012 0500 11/27/12 0600  NA 143 142  K 3.7 3.7  CL 109 107  CO2 25 28  GLUCOSE 139* 168*  BUN 10 10  CREATININE 0.96 0.94  CALCIUM 8.8 8.6   PT/INR No results found for this basename: LABPROT, INR,  in the last 72 hours   Recent Labs Lab 11/21/12 1815 11/27/12 0600  AST 10 12  ALT 12 10  ALKPHOS 56 58  BILITOT 0.2* 0.2*  PROT 7.2 6.1  ALBUMIN 2.8* 2.1*     Lipase  No results found for this basename: lipase     Studies/Results: Dg Abd 2 Views  12-10-12  *RADIOLOGY REPORT*  Clinical Data: Rule out ileus  ABDOMEN - 2 VIEW  Comparison: 11/22/2012.  Findings: Distended small bowel loops with air-fluid levels are noted in the right upper abdomen highly suspicious for significant ileus or bowel obstruction.  NG tube with tip at the level of the GE junction.  The NG tube should be advanced into the stomach. Midline skin staples are noted.   IMPRESSION: Distended small bowel loops with air-fluid levels are noted in the right upper abdomen highly suspicious for significant ileus or bowel obstruction.  NG tube with tip at the level of the GE junction.  The NG tube should be advanced into the stomach. Midline skin staples are noted.   Original Report Authenticated By: Natasha Mead, M.D.     Medications: . antiseptic oral rinse  15 mL Mouth Rinse q12n4p  . enoxaparin (LOVENOX) injection  40 mg Subcutaneous Daily  . HYDROmorphone PCA 0.3 mg/mL   Intravenous Q4H  . insulin aspart  0-15 Units Subcutaneous Q4H  . metoprolol  5 mg Intravenous Q6H  . pantoprazole (PROTONIX) IV  40 mg Intravenous QHS    Assessment/Plan S/p Ladd procedure for malrotation w/ post op ileus(Dr. Dwain Sarna 11/22/12)  Hypertension and tachycardia  Diabetes Mellitus  Squamous cell CA of scrotum, chemo via [facility]  VTE prophylaxis  Mild anemia  Plan:  I have advanced his NG, and got about 500 out of his stomach.  He is on TNA, his son and wife are here so I am encouraging them to walk him more today. Leave him on suction for now.   LOS: 6 days    JENNINGS,WILLARD 11/27/2012  Not much change.  Wound looks okay.  Awaiting return of bowel function

## 2012-11-27 NOTE — Progress Notes (Signed)
PARENTERAL NUTRITION CONSULT NOTE - Follow Up Pharmacy Consult for TPN Indication: intolerance to oral feedings  No Known Allergies  Patient Measurements: Height: 6' (182.9 cm) Weight: 245 lb (111.131 kg) IBW/kg (Calculated) : 77.6  Vital Signs: Temp: 98.9 F (37.2 C) (04/12 0551) Temp src: Oral (04/12 0551) BP: 140/75 mmHg (04/12 0551) Pulse Rate: 104 (04/12 0551) Intake/Output from previous day: 04/11 0701 - 04/12 0700 In: 1146.7 [I.V.:1146.7] Out: 600 [Urine:550; Emesis/NG output:50] Intake/Output from this shift:    Labs:  Recent Labs  11/25/12 0630 11/27/12 0600  WBC 8.9 7.2  HGB 9.2* 8.7*  HCT 27.1* 26.2*  PLT 212 208     Recent Labs  11/25/12 0630 11/26/12 0500 11/27/12 0600  NA 142 143 142  K 4.0 3.7 3.7  CL 109 109 107  CO2 23 25 28   GLUCOSE 111* 139* 168*  BUN 11 10 10   CREATININE 0.96 0.96 0.94  CALCIUM 8.7 8.8 8.6  MG  --   --  1.7  PHOS  --   --  3.0  PROT  --   --  6.1  ALBUMIN  --   --  2.1*  AST  --   --  12  ALT  --   --  10  ALKPHOS  --   --  58  BILITOT  --   --  0.2*  TRIG  --   --  108  CHOL  --   --  163   Estimated Creatinine Clearance: 110.3 ml/min (by C-G formula based on Cr of 0.94).    Recent Labs  11/27/12 0010 11/27/12 0418 11/27/12 0738  GLUCAP 156* 152* 153*    Medical History: Past Medical History  Diagnosis Date  . Diabetes mellitus without complication   . Hypertension   . Mass of right inguinal region   . Squamous cell carcinoma   . Adrenal adenoma   . Sigmoid diverticulitis   . Skin cancer     Medications:  Prescriptions prior to admission  Medication Sig Dispense Refill  . docusate sodium (COLACE) 100 MG capsule Take 100 mg by mouth 2 (two) times daily.      Marland Kitchen HYDROcodone-acetaminophen (NORCO/VICODIN) 5-325 MG per tablet Take 1-2 tablets by mouth every 6 (six) hours as needed.  30 tablet  0  . lisinopril (PRINIVIL,ZESTRIL) 20 MG tablet Take 1 tablet (20 mg total) by mouth daily.  30 tablet   11  . metFORMIN (GLUCOPHAGE) 1000 MG tablet Take 1 tablet (1,000 mg total) by mouth 2 (two) times daily with a meal.  60 tablet  11  . metoprolol tartrate (LOPRESSOR) 12.5 mg TABS Take 0.5 tablets (12.5 mg total) by mouth 2 (two) times daily.  30 tablet  0  . morphine (MSIR) 15 MG tablet Take 15 mg by mouth every 4 (four) hours as needed for pain.      Marland Kitchen nystatin (MYCOSTATIN) powder Apply 1 g topically 4 (four) times daily.      . ondansetron (ZOFRAN) 4 MG tablet Take 4 mg by mouth every 8 (eight) hours as needed for nausea.      Marland Kitchen oxyCODONE (OXY IR/ROXICODONE) 5 MG immediate release tablet Take 5-10 mg by mouth every 4 (four) hours as needed for pain.      . promethazine (PHENERGAN) 25 MG tablet Take 25 mg by mouth every 6 (six) hours as needed for nausea.      Marland Kitchen senna (SENOKOT) 8.6 MG TABS Take 1 tablet by mouth 2 (two) times  daily.       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 ) GI: NGT 50 ml output Endo: h/o DM  Metformin PTA  cbgs 142-160  on ssi Lytes: K 3.7, Phos 3.0, mag 1.7 Renal: SrCr 0.94  Pulm: 1L02 Cards: HTN   Lisinopril and lopressor PTA    BP 140/75 HR 104 on scheduled IV lopresor, prn hydralazine Hepatobil: Baseline LFTs OK, Alb 2.1, TG 108, Chol 163 Neuro: on PCA ID:   No Abx  Tmax 100.1  WBC 7.2 Best Practices: lovenox, ppi TPN Access: PICC line  4/11 TPN day#:1  Insulin Requirements in the past 24 hours:  15 units  Current Nutrition:  NPO  Assessment: 57 yo man who has been NPO for several days to start TPN  Nutritional Goals:  2200-2500 KCal, 100-120 gm protein Clinimix E 5/20 @ 100 ml/hr will provide 120 gm protein and aver 2318kCal (with lipids MWF)   Plan:  Change Clinimix E to 5/20. Increase rate to 60 ml/hr Add 15 units insulin to TPN Magnesium bolus 2 gm IV X 1 MVI, TE and lipids on MWF only due to national backorder. F/u am labs.  Talbert Cage  [place] 11/27/2012,8:24 AM

## 2012-11-28 ENCOUNTER — Inpatient Hospital Stay (HOSPITAL_COMMUNITY): Payer: 59

## 2012-11-28 DIAGNOSIS — E46 Unspecified protein-calorie malnutrition: Secondary | ICD-10-CM

## 2012-11-28 LAB — GLUCOSE, CAPILLARY
Glucose-Capillary: 183 mg/dL — ABNORMAL HIGH (ref 70–99)
Glucose-Capillary: 184 mg/dL — ABNORMAL HIGH (ref 70–99)
Glucose-Capillary: 155 mg/dL — ABNORMAL HIGH (ref 70–99)

## 2012-11-28 LAB — BASIC METABOLIC PANEL
BUN: 10 mg/dL (ref 6–23)
Creatinine, Ser: 0.96 mg/dL (ref 0.50–1.35)
GFR calc Af Amer: >90 mL/min (ref >90)
GFR calc non Af Amer: 90 mL/min — ABNORMAL LOW (ref >90)

## 2012-11-28 LAB — MAGNESIUM: Magnesium: 2.1 mg/dL (ref 1.5–2.5)

## 2012-11-28 MED ORDER — POTASSIUM CHLORIDE 10 MEQ/50ML IV SOLN
10.0000 meq | INTRAVENOUS | Status: AC
  Administered 2012-11-28 (×4): 10 meq via INTRAVENOUS
  Filled 2012-11-28 (×4): qty 50

## 2012-11-28 MED ORDER — INSULIN REGULAR HUMAN 100 UNIT/ML IJ SOLN
INTRAVENOUS | Status: AC
  Administered 2012-11-28 – 2012-11-29 (×2): via INTRAVENOUS
  Filled 2012-11-28: qty 2000

## 2012-11-28 NOTE — Progress Notes (Addendum)
PARENTERAL NUTRITION CONSULT NOTE - Follow Up Pharmacy Consult for TPN Indication: intolerance to oral feedings  No Known Allergies  Patient Measurements: Height: 6' (182.9 cm) Weight: 245 lb (111.131 kg) IBW/kg (Calculated) : 77.6  Vital Signs: Temp: 99.1 F (37.3 C) (04/13 0528) Temp src: Oral (04/13 0528) BP: 128/77 mmHg (04/13 0528) Pulse Rate: 90 (04/13 0528) Intake/Output from previous day: 04/12 0701 - 04/13 0700 In: 2307 [I.V.:865; NG/GT:100; TPN:1342] Out: 3501 [Urine:700; Emesis/NG output:2800; Stool:1] Intake/Output from this shift:    Labs:  Recent Labs  11/27/12 0600  WBC 7.2  HGB 8.7*  HCT 26.2*  PLT 208     Recent Labs  11/26/12 0500 11/27/12 0600 11/28/12 0500  NA 143 142 144  K 3.7 3.7 3.4*  CL 109 107 107  CO2 25 28 26   GLUCOSE 139* 168* 170*  BUN 10 10 10   CREATININE 0.96 0.94 0.96  CALCIUM 8.8 8.6 9.0  MG  --  1.7 2.1  PHOS  --  3.0 3.8  PROT  --  6.1  --   ALBUMIN  --  2.1*  --   AST  --  12  --   ALT  --  10  --   ALKPHOS  --  58  --   BILITOT  --  0.2*  --   PREALBUMIN  --  5.5*  --   TRIG  --  108  --   CHOL  --  163  --    Estimated Creatinine Clearance: 108 ml/min (by C-G formula based on Cr of 0.96).    Recent Labs  11/27/12 2332 11/28/12 0335 11/28/12 0726  GLUCAP 170* 165* 156*    Medical History: Past Medical History  Diagnosis Date  . Diabetes mellitus without complication   . Hypertension   . Mass of right inguinal region   . Squamous cell carcinoma   . Adrenal adenoma   . Sigmoid diverticulitis   . Skin cancer     Medications:  Prescriptions prior to admission  Medication Sig Dispense Refill  . docusate sodium (COLACE) 100 MG capsule Take 100 mg by mouth 2 (two) times daily.      Marland Kitchen HYDROcodone-acetaminophen (NORCO/VICODIN) 5-325 MG per tablet Take 1-2 tablets by mouth every 6 (six) hours as needed.  30 tablet  0  . lisinopril (PRINIVIL,ZESTRIL) 20 MG tablet Take 1 tablet (20 mg total) by mouth  daily.  30 tablet  11  . metFORMIN (GLUCOPHAGE) 1000 MG tablet Take 1 tablet (1,000 mg total) by mouth 2 (two) times daily with a meal.  60 tablet  11  . metoprolol tartrate (LOPRESSOR) 12.5 mg TABS Take 0.5 tablets (12.5 mg total) by mouth 2 (two) times daily.  30 tablet  0  . morphine (MSIR) 15 MG tablet Take 15 mg by mouth every 4 (four) hours as needed for pain.      Marland Kitchen nystatin (MYCOSTATIN) powder Apply 1 g topically 4 (four) times daily.      . ondansetron (ZOFRAN) 4 MG tablet Take 4 mg by mouth every 8 (eight) hours as needed for nausea.      Marland Kitchen oxyCODONE (OXY IR/ROXICODONE) 5 MG immediate release tablet Take 5-10 mg by mouth every 4 (four) hours as needed for pain.      . promethazine (PHENERGAN) 25 MG tablet Take 25 mg by mouth every 6 (six) hours as needed for nausea.      Marland Kitchen senna (SENOKOT) 8.6 MG TABS Take 1 tablet by mouth 2 (  two) times daily.       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 ) GI: NGT 2800 ml output, noted tip to be in GE junction, loose diarrhea noted Endo: h/o DM  Metformin PTA  cbgs 156-179 on ssi Lytes: K 3.4, Phos 3.8, mag 2.1 Renal: SrCr 0.96  UOP not recorded accurately Pulm: RA Cards: HTN   Lisinopril and lopressor PTA    BP 128/77 HR 90 on scheduled IV lopresor, prn hydralazine Hepatobil: Baseline LFTs OK, Alb 2.1, TG 108, Chol 163  Prealb 5.5 Neuro: on PCA ID:   No Abx  Tmax 99.1  WBC 7.2 Best Practices: lovenox, ppi TPN Access: PICC line  4/11 TPN day#:2  Insulin Requirements in the past 12 hours:  12 units  Current Nutrition:  Clinimix 5/20 @ 60 ml/hr  Assessment: 57 yo man who has been NPO for several days to start TPN  Nutritional Goals:  2200-2500 KCal, 100-120 gm protein Clinimix E 5/20 @ 100 ml/hr will provide 120 gm protein and aver 2318kCal (with lipids MWF)   Plan:  Cont  Clinimix E  5/20 to 60 ml/hr Increase insulin to 30 units per bag KCl  10 mEq IV X 4 MVI, TE and lipids on MWF only due to national backorder. F/u am labs.  Theodore Flores 11/28/2012,8:31 AM

## 2012-11-28 NOTE — Progress Notes (Signed)
6 Days Post-Op  Subjective: Theodore Flores looks and feels better, son has him up in chair this AM Theodore Flores's been walking.  Had some loose diarrhea yesterday when walking.  Complains of discomfort swallowing with the NG in and discomfort with the NG.  Objective: Vital signs in last 24 hours: Temp:  [99 F (37.2 C)-99.9 F (37.7 C)] 99.1 F (37.3 C) (04/13 0528) Pulse Rate:  [90-108] 90 (04/13 0528) Resp:  [7-26] 20 (04/13 0528) BP: (111-135)/(69-82) 128/77 mmHg (04/13 0528) SpO2:  [96 %-100 %] 100 % (04/13 0528) Last BM Date: 11/27/12 NG 2800 reported out now., 1 stool Tm99.2, VSS, K+ is low Intake/Output from previous day: 04/12 0701 - 04/13 0700 In: 2307 [I.V.:865; NG/GT:100; TPN:1342] Out: 3501 [Urine:700; Emesis/NG output:2800; Stool:1] Intake/Output this shift:    General appearance: alert, cooperative and no distress Resp: clear to auscultation bilaterally GI: soft, few BS, some loose diarrhea type stools yesterday, uncontolled by Pt.  Incision looks good.  Lab Results:   Recent Labs  11/27/12 0600  WBC 7.2  HGB 8.7*  HCT 26.2*  PLT 208    BMET  Recent Labs  11/27/12 0600 11/28/12 0500  NA 142 144  K 3.7 3.4*  CL 107 107  CO2 28 26  GLUCOSE 168* 170*  BUN 10 10  CREATININE 0.94 0.96  CALCIUM 8.6 9.0   PT/INR No results found for this basename: LABPROT, INR,  in the last 72 hours   Recent Labs Lab 11/21/12 1815 11/27/12 0600  AST 10 12  ALT 12 10  ALKPHOS 56 58  BILITOT 0.2* 0.2*  PROT 7.2 6.1  ALBUMIN 2.8* 2.1*     Lipase  No results found for this basename: lipase     Studies/Results: Dg Abd 2 Views  Dec 24, 2012  *RADIOLOGY REPORT*  Clinical Data: Rule out ileus  ABDOMEN - 2 VIEW  Comparison: 11/22/2012.  Findings: Distended small bowel loops with air-fluid levels are noted in the right upper abdomen highly suspicious for significant ileus or bowel obstruction.  NG tube with tip at the level of the GE junction.  The NG tube should be advanced into the  stomach. Midline skin staples are noted.  IMPRESSION: Distended small bowel loops with air-fluid levels are noted in the right upper abdomen highly suspicious for significant ileus or bowel obstruction.  NG tube with tip at the level of the GE junction.  The NG tube should be advanced into the stomach. Midline skin staples are noted.   Original Report Authenticated By: Natasha Mead, M.D.     Medications: . antiseptic oral rinse  15 mL Mouth Rinse q12n4p  . enoxaparin (LOVENOX) injection  40 mg Subcutaneous Daily  . HYDROmorphone PCA 0.3 mg/mL   Intravenous Q4H  . insulin aspart  0-15 Units Subcutaneous Q4H  . metoprolol  5 mg Intravenous Q6H  . pantoprazole (PROTONIX) IV  40 mg Intravenous QHS    Assessment/Plan S/p Ladd procedure for malrotation w/ post op ileus(Dr. Dwain Sarna 11/22/12)  Hypertension and tachycardia  Diabetes Mellitus  Squamous cell CA of scrotum, chemo via [facility], Open suppurative tumor ulcer. (Oncologist Dr. Malena Peer at [facility]) VTE prophylaxis  Mild anemia    Plan: Recheck film today and make sure NG isn't in to far.  Continue TNA, dressing changes to his open ulcer right scrotum. Labs in AM.      LOS: 7 days    Theodore Flores,Theodore Flores 11/28/2012  Xray and abdomen look good.  Theodore Flores still has a lot of green in  NG but will trial clamping his tube and see how Theodore Flores does with this.  If any nausea, will replace to suction.

## 2012-11-29 LAB — DIFFERENTIAL
Basophils Absolute: 0 10*3/uL (ref 0.0–0.1)
Lymphocytes Relative: 7 % — ABNORMAL LOW (ref 12–46)
Lymphs Abs: 0.4 10*3/uL — ABNORMAL LOW (ref 0.7–4.0)
Monocytes Absolute: 0.5 10*3/uL (ref 0.1–1.0)
Neutro Abs: 4.9 10*3/uL (ref 1.7–7.7)

## 2012-11-29 LAB — GLUCOSE, CAPILLARY: Glucose-Capillary: 154 mg/dL — ABNORMAL HIGH (ref 70–99)

## 2012-11-29 LAB — PHOSPHORUS: Phosphorus: 3.6 mg/dL (ref 2.3–4.6)

## 2012-11-29 LAB — CBC
HCT: 29.7 % — ABNORMAL LOW (ref 39.0–52.0)
Hemoglobin: 9.9 g/dL — ABNORMAL LOW (ref 13.0–17.0)
RBC: 3.39 MIL/uL — ABNORMAL LOW (ref 4.22–5.81)
RDW: 12.3 % (ref 11.5–15.5)
WBC: 5.9 10*3/uL (ref 4.0–10.5)

## 2012-11-29 LAB — COMPREHENSIVE METABOLIC PANEL
AST: 61 U/L — ABNORMAL HIGH (ref 0–37)
Albumin: 2.4 g/dL — ABNORMAL LOW (ref 3.5–5.2)
Calcium: 9.2 mg/dL (ref 8.4–10.5)
Creatinine, Ser: 0.9 mg/dL (ref 0.50–1.35)
GFR calc non Af Amer: >90 mL/min (ref >90)
Sodium: 141 mEq/L (ref 135–145)
Total Protein: 7 g/dL (ref 6.0–8.3)

## 2012-11-29 LAB — TRIGLYCERIDES: Triglycerides: 121 mg/dL (ref <150)

## 2012-11-29 MED ORDER — TRACE MINERALS CR-CU-F-FE-I-MN-MO-SE-ZN IV SOLN
INTRAVENOUS | Status: AC
  Filled 2012-11-29: qty 2000

## 2012-11-29 MED ORDER — HYDROMORPHONE HCL PF 1 MG/ML IJ SOLN: 1.0000 mg | INTRAMUSCULAR | Status: DC | PRN

## 2012-11-29 MED ORDER — SODIUM CHLORIDE 0.9 % IV SOLN
INTRAVENOUS | Status: DC
  Administered 2012-11-29 – 2012-11-30 (×2): via INTRAVENOUS

## 2012-11-29 MED ORDER — OXYCODONE HCL 5 MG PO TABS
5.0000 mg | ORAL_TABLET | Freq: Four times a day (QID) | ORAL | Status: DC | PRN
  Administered 2012-12-01: 10 mg via ORAL
  Filled 2012-11-29: qty 2

## 2012-11-29 MED ORDER — FAT EMULSION 20 % IV EMUL
240.0000 mL | INTRAVENOUS | Status: AC
  Administered 2012-11-29: 240 mL via INTRAVENOUS
  Filled 2012-11-29: qty 250

## 2012-11-29 MED ORDER — MAGNESIUM SULFATE 40 MG/ML IJ SOLN
2.0000 g | Freq: Once | INTRAMUSCULAR | Status: AC
  Administered 2012-11-29: 2 g via INTRAVENOUS
  Filled 2012-11-29: qty 50

## 2012-11-29 NOTE — Progress Notes (Signed)
7 Days Post-Op  Subjective: Pt. Up in chair, wife at bedside.  States his pain is much better.  +flatus, 2 BMs today and 1 yesterday.  He is NPO NGT on suction.  Still coughing up lage amounts white phlegm.  Denies fever or chills.  Denies shortness of breath.  Denies nausea or vomiting.  Objective: Vital signs in last 24 hours: Temp:  [97.3 F (36.3 C)-99.3 F (37.4 C)] 97.3 F (36.3 C) (04/14 0515) Pulse Rate:  [93-115] 103 (04/14 0515) Resp:  [10-20] 16 (04/14 0800) BP: (123-159)/(61-91) 159/91 mmHg (04/14 0515) SpO2:  [95 %-100 %] 98 % (04/14 0800) Last BM Date: 11/28/12  Intake/Output from previous day: 04/13 0701 - 04/14 0700 In: 2638.3 [P.O.:660; I.V.:1115.3; IV Piggyback:200; TPN:663] Out: 1700 [Emesis/NG output:1700] Intake/Output this shift:    General appearance: alert, cooperative and no distress Resp: coarse BLL fields, no crackles or wheezes.  Non labored breathing, no chest wall tenderness, cyanosis Cardio: regular rate and rhythm, S1, S2 normal, no murmur, click, rub or gallop GI: abdomen is soft, round and mildly tender.  Positive bowel sounds x4 quadrants.  Incision edges are well approximated, without any erythema or drainage, staples are intact.  No organomegaly, masses or hernias Male genitalia: normal, right scrotal wound-cleansed with sterile water and 4x4.  Good granulation tissue with superficial purulent colonization, malodorous.  Wet to dry dressing applied. Neurologic: Alert and oriented X 3, normal strength and tone. Normal symmetric reflexes. Normal coordination and gait  Lab Results:   Recent Labs  11/27/12 0600 11/29/12 0510  WBC 7.2 5.9  HGB 8.7* 9.9*  HCT 26.2* 29.7*  PLT 208 218   BMET  Recent Labs  11/28/12 0500 11/29/12 0510  NA 144 141  K 3.4* 3.9  CL 107 108  CO2 26 24  GLUCOSE 170* 173*  BUN 10 14  CREATININE 0.96 0.90  CALCIUM 9.0 9.2    Studies/Results: Dg Abd Portable 1v  11/28/2012  *RADIOLOGY REPORT*  Clinical  Data: Postop ileus.  PORTABLE ABDOMEN - 1 VIEW  Comparison: 11/26/2012  Findings: Nasogastric tube is in the left upper abdomen and presumably in the stomach.  There is a small amount of gas within the left colon with stool.  Otherwise, there is minimal bowel gas. Surgical staples along the midline.  IMPRESSION: There is minimal bowel gas as described.  Small amount of gas in the left colon.  The findings are nonspecific.  Nasogastric tube is in the stomach region.   Original Report Authenticated By: Richarda Overlie, M.D.     Anti-infectives: Anti-infectives   Start     Dose/Rate Route Frequency Ordered Stop   11/22/12 1200  [MAR Hold]  cefOXitin (MEFOXIN) 2 g in dextrose 5 % 50 mL IVPB     (On MAR Hold since 11/22/12 1152)   2 g 100 mL/hr over 30 Minutes Intravenous  Once 11/22/12 1121 11/22/12 1225      Assessment/Plan: S/p Ladd procedure for malrotation w/ post op ileus(Dr. Dwain Sarna 11/22/12)  -improving.  -DC NGT and start clear liquid diet.  The patient was advised to take small sips and advise nursing should nausea, vomiting or dysphagia develop.  -Ambulate in halls.  -Continue with IS  -maintain adequate pain control.  -Continue with IV hydration -Continue with TNA, plan to start weaning per pharmacy  tomorrow if tolerating PO. -Will need to arrange follow up in clinic for staples removal.   Hypertension and tachycardia  -continue with IVP metoprolol. Change to PO tomorrow. -Telemetry  monitor.   Diabetes Mellitus  -sliding scale and QID BGs  -PCP follow up upon discharge.   Squamous cell CA of scrotum, chemo via [facility]  -dressing changes q6h -will need to get a hold of oncology regarding treatments(transfer to [facility] oncology consult)  VTE prophylaxis  -SCD and lovenox    LOS: 8 days    Ayaan Shutes ANP-BC 11/29/2012

## 2012-11-29 NOTE — Progress Notes (Signed)
Spoke with Pharmacist regarding the discrepancy in the rate that is printed on the bag versus what's in the computer.   Bag says 60cc/hr and order in computer says 80cc/hr.   So pharmacist is going to send a corrected label to the unit.

## 2012-11-29 NOTE — Progress Notes (Signed)
Patient walked the halls with wife. Denies pain or discomfort. Later verbalized emotional support related to an argument he had with his wife that staff did not witness. All safety measures in place. Patient told to call if assistance is needed.

## 2012-11-29 NOTE — Progress Notes (Signed)
Looks good. +bms. No n/v.   abd soft, nt, nd incision c/d/i  Adv diet  Mary Sella. Andrey Campanile, MD, FACS General, Bariatric, & Minimally Invasive Surgery [facility], [place]

## 2012-11-29 NOTE — Progress Notes (Addendum)
Occupational Therapy Treatment Patient Details Name: Theodore Flores MRN: [id number] DOB: 24-Mar-1956 Today's Date: 11/29/2012 Time: 7829-5621 OT Time Calculation (min): 19 min  OT Assessment / Plan / Recommendation Comments on Treatment Session Pt at minguard level for LB ADLs. Pt progressing well. No pain reported. Talked to spouse about purchasing chair to place in walk in shower if needed. She will be able to assist as needed upon d/c.     Follow Up Recommendations  No OT follow up    Barriers to Discharge       Equipment Recommendations  None recommended by OT    Recommendations for Other Services    Frequency Min 2X/week   Plan Discharge plan remains appropriate    Precautions / Restrictions Precautions Precautions: Fall Precaution Comments: lines/tubes Restrictions Weight Bearing Restrictions: No   Pertinent Vitals/Pain No pain reported.     ADL  Lower Body Bathing: Min guard Where Assessed - Lower Body Bathing: Supported sit to stand Lower Body Dressing: Min guard Where Assessed - Lower Body Dressing: Supported sit to stand Toilet Transfer: Hydrographic surveyor Method: Sit to Barista: Comfort height toilet;Grab bars Toileting - Architect and Hygiene: Min guard Where Assessed - Engineer, mining and Hygiene: Standing Equipment Used: Rolling walker Transfers/Ambulation Related to ADLs: cues for hand placement. Min assist for lines. ADL Comments: Pt reported no pain today. Able to don/doff socks while sitting in chair with no difficulty. At [facility] level for LB ADLs while performing sit to stand transfer.    OT Diagnosis:    OT Problem List:   OT Treatment Interventions:     OT Goals Acute Rehab OT Goals OT Goal Formulation: With patient Time For Goal Achievement: 12/02/12 Potential to Achieve Goals: Good ADL Goals Pt Will Perform Lower Body Bathing: with modified independence;Sit to stand from  chair ADL Goal: Lower Body Bathing - Progress: Progressing toward goals Pt Will Perform Lower Body Dressing: with modified independence;with adaptive equipment ADL Goal: Lower Body Dressing - Progress: Progressing toward goals Pt Will Perform Tub/Shower Transfer: Shower transfer;with modified independence  Visit Information  Last OT Received On: 11/29/12 Assistance Needed: +1    Subjective Data   I am ready to get out of here.    Prior Functioning       Cognition  Cognition Overall Cognitive Status: Appears within functional limits for tasks assessed/performed Arousal/Alertness: Awake/alert Orientation Level: Appears intact for tasks assessed Behavior During Session: [facility] for tasks performed    Mobility  Bed Mobility Bed Mobility: Supine to Sit;Sitting - Scoot to Edge of Bed Supine to Sit: 5: Supervision;With rails Sitting - Scoot to Edge of Bed: 5: Supervision Transfers Transfers: Sit to Stand;Stand to Sit Sit to Stand: 4: Min guard;From bed;From chair/3-in-1 Stand to Sit: 4: Min guard;To chair/3-in-1 Details for Transfer Assistance: cues for hand position and to push up from bed.     Exercises      Balance     End of Session OT - End of Session Activity Tolerance: Patient tolerated treatment well Patient left: in chair;with family/visitor present;Other (comment) (with nurse practitioner)  GO     Earlie Raveling OTR/L [phone] 11/29/2012, 9:43 AM

## 2012-11-29 NOTE — Progress Notes (Signed)
PARENTERAL NUTRITION CONSULT NOTE - Follow Up Pharmacy Consult for TPN Indication: ileus   No Known Allergies  Patient Measurements: Height: 6' (182.9 cm) Weight: 245 lb (111.131 kg) IBW/kg (Calculated) : 77.6  Vital Signs: Temp: 98.7 F (37.1 C) (04/14 1030) Temp src: Oral (04/14 1030) BP: 149/85 mmHg (04/14 1030) Pulse Rate: 108 (04/14 1030) Intake/Output from previous day: 04/13 0701 - 04/14 0700 In: 2638.3 [P.O.:660; I.V.:1115.3; IV Piggyback:200; TPN:663] Out: 1700 [Emesis/NG output:1700] Intake/Output from this shift:    Labs:  Recent Labs  11/27/12 0600 11/29/12 0510  WBC 7.2 5.9  HGB 8.7* 9.9*  HCT 26.2* 29.7*  PLT 208 218     Recent Labs  11/27/12 0600 11/28/12 0500 11/29/12 0510  NA 142 144 141  K 3.7 3.4* 3.9  CL 107 107 108  CO2 28 26 24   GLUCOSE 168* 170* 173*  BUN 10 10 14   CREATININE 0.94 0.96 0.90  CALCIUM 8.6 9.0 9.2  MG 1.7 2.1 1.8  PHOS 3.0 3.8 3.6  PROT 6.1  --  7.0  ALBUMIN 2.1*  --  2.4*  AST 12  --  61*  ALT 10  --  84*  ALKPHOS 58  --  85  BILITOT 0.2*  --  0.3  PREALBUMIN 5.5*  --  11.0*  TRIG 108  --  121  CHOL 163  --  139   Estimated Creatinine Clearance: 115.2 ml/min (by C-G formula based on Cr of 0.9).    Recent Labs  11/28/12 2314 11/29/12 0342 11/29/12 0735  GLUCAP 155* 154* 175*    Medical History: Past Medical History  Diagnosis Date  . Diabetes mellitus without complication   . Hypertension   . Mass of right inguinal region   . Squamous cell carcinoma   . Adrenal adenoma   . Sigmoid diverticulitis   . Skin cancer    Insulin Requirements in the past 12 hours:  14 units  Current Nutrition:  Clinimix 5/20 @ 80 ml/hr Clinimix E 5/20 @ 100 ml/hr will provide 120 gm protein and aver 2318kCal (with lipids MWF)  Nutritional Goals:  2200-2500 KCal, 100-120 gm protein  Assessment: Ileus s/p exp lap, appendectomy and Ladd's procedure for malrotation on 4/7.  GI: NGT output down to 1700cc in the  last 24h. Patient having BMs and passing gas. Noted plans to d/c NGT and start clears today. Plans to continue TPN for now, and wean tomorrow pending PO progress. Endo: h/o DM  Metformin PTA. CBG control fair for goal < 150. Noted on SSI, insulin supplementation via TPN. Lytes: Lytes largely nml, would aim for Mg ~2 (currently 1.8). MIVF with D545NS with 20 K at 50cc/hr Renal: SrCr stable, nml. UOP not recorded accurately Pulm: RA Cards: Hx HTN, BP slightly elevated. HR 100s- on scheduled lopressor and prn hydralazine Hepatobil: Noted LFTs increased, Trig and chol ok. Prealbumin trending up, although still low for goal. Would expect steady progress now that post-op. ID:   No Abx  Tmax 98.7,  WBC 5.9 Best Practices: lovenox, ppi TPN Access: PICC line  4/11 TPN day#:3  Plan:  - Continue Clinimix E 5/20 at 80 ml/hr- will hold off on advancing d/t EN starting today - Will increase insulin to 40 units per bag - Will give 2 g IV Mg - Will change D545NS with 20 K at 50cc/hr to NS - Will supplement MVI, trace elements and IV fats on MWF due to ongoing national shortages - Will f/up BMET in AM - Will  f/up EN progress and start weaning TPN tomorrow if appropriate.  Thanks, Arvis Miguez K. Allena Katz, PharmD, BCPS.  Clinical Pharmacist Pager [phone]. 11/29/2012 11:40 AM

## 2012-11-29 NOTE — Progress Notes (Signed)
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 Pt progressing well with mobility and discussed he will likely not need a RW on d/c. He is hopeful he will not.    Follow Up Recommendations  No PT follow up     Does the patient have the potential to tolerate intense rehabilitation     Barriers to Discharge        Equipment Recommendations  Rolling walker with 5" wheels    Recommendations for Other Services    Frequency Min 3X/week   Plan Discharge plan remains appropriate;Frequency remains appropriate    Precautions / Restrictions Precautions Precautions: Fall Precaution Comments: lines/tubes Restrictions Weight Bearing Restrictions: No   Pertinent Vitals/Pain Denied pain; no apparent distress     Mobility  Bed Mobility Bed Mobility: Supine to Sit;Sitting - Scoot to Edge of Bed Rolling Right: 5: Supervision;With rail Right Sidelying to Sit: 5: Supervision Sitting - Scoot to Edge of Bed: 7: Independent Details for Bed Mobility Assistance: pt questioned on best technique to come to sit EOB to protect his stomach and he began to pull up from supine; vc for rolling onto his side and side to sit Transfers Sit to Stand: From bed;From chair/3-in-1;5: Supervision Stand to Sit: To chair/3-in-1;5: Supervision Details for Transfer Assistance: x2; pt recalled correct hand placement for safe use of RW Ambulation/Gait Ambulation/Gait Assistance: 5: Supervision Ambulation Distance (Feet): 500 Feet (seated rest after 400) Assistive device: Rolling walker;Other (Comment) (IV pole in Rt hand) Ambulation/Gait Assistance Details: Pt with excellent upright posture and gait velocity with RW; pt  hesitant to try walking while holding IV pole, yet did very well after encouraged to do so Gait Pattern: Within Functional Limits         PT Goals Acute Rehab PT Goals Pt will go Supine/Side to Sit: with modified independence PT Goal: Supine/Side to Sit - Progress: Progressing toward goal Pt will go Sit to Supine/Side: with modified independence PT Goal: Sit to Supine/Side - Progress: Progressing toward goal Pt will go Sit to Stand: with modified independence PT Goal: Sit to Stand - Progress: Progressing toward goal Pt will go Stand to Sit: with modified independence PT Goal: Stand to Sit - Progress: Progressing toward goal Pt will Ambulate: >150 feet;with modified independence;with least restrictive assistive device PT Goal: Ambulate - Progress: Progressing toward goal  Visit Information  Last PT Received On: 11/29/12 Assistance Needed: +1         Cognition  Cognition Overall Cognitive Status: Appears within functional limits for tasks assessed/performed Arousal/Alertness: Awake/alert Orientation Level: Appears intact for tasks assessed Behavior During Session: [facility] for tasks performed        End of Session PT - End of Session Activity Tolerance: Patient tolerated treatment well Patient left: in chair;with call bell/phone within reach Nurse Communication: Mobility status;Other (comment) (encourage pt to walk pushing IV pole)   GP     Marcee Jacobs 11/29/2012, 3:33 PM  11/29/2012 Veda Canning, PT Pager: [phone]

## 2012-11-30 DIAGNOSIS — Q433 Congenital malformations of intestinal fixation: Secondary | ICD-10-CM

## 2012-11-30 LAB — BASIC METABOLIC PANEL
BUN: 15 mg/dL (ref 6–23)
Calcium: 9 mg/dL (ref 8.4–10.5)
GFR calc non Af Amer: >90 mL/min (ref >90)
Glucose, Bld: 161 mg/dL — ABNORMAL HIGH (ref 70–99)
Potassium: 3.6 mEq/L (ref 3.5–5.1)

## 2012-11-30 LAB — GLUCOSE, CAPILLARY
Glucose-Capillary: 118 mg/dL — ABNORMAL HIGH (ref 70–99)
Glucose-Capillary: 126 mg/dL — ABNORMAL HIGH (ref 70–99)
Glucose-Capillary: 149 mg/dL — ABNORMAL HIGH (ref 70–99)
Glucose-Capillary: 163 mg/dL — ABNORMAL HIGH (ref 70–99)

## 2012-11-30 MED ORDER — INSULIN ASPART 100 UNIT/ML ~~LOC~~ SOLN
0.0000 [IU] | Freq: Three times a day (TID) | SUBCUTANEOUS | Status: DC
  Administered 2012-11-30: 2 [IU] via SUBCUTANEOUS
  Administered 2012-11-30: 3 [IU] via SUBCUTANEOUS
  Administered 2012-12-01: 2 [IU] via SUBCUTANEOUS

## 2012-11-30 MED ORDER — METFORMIN HCL 500 MG PO TABS
1000.0000 mg | ORAL_TABLET | Freq: Two times a day (BID) | ORAL | Status: DC
  Administered 2012-11-30 – 2012-12-01 (×3): 1000 mg via ORAL
  Filled 2012-11-30 (×5): qty 2

## 2012-11-30 MED ORDER — PANTOPRAZOLE SODIUM 40 MG PO TBEC
40.0000 mg | DELAYED_RELEASE_TABLET | Freq: Every day | ORAL | Status: DC
  Administered 2012-11-30: 40 mg via ORAL
  Filled 2012-11-30 (×2): qty 1

## 2012-11-30 MED ORDER — ENSURE COMPLETE PO LIQD
237.0000 mL | Freq: Two times a day (BID) | ORAL | Status: DC
  Administered 2012-11-30 – 2012-12-01 (×2): 237 mL via ORAL

## 2012-11-30 MED ORDER — LISINOPRIL 20 MG PO TABS
20.0000 mg | ORAL_TABLET | Freq: Every day | ORAL | Status: DC
  Administered 2012-11-30 – 2012-12-01 (×2): 20 mg via ORAL
  Filled 2012-11-30 (×2): qty 1

## 2012-11-30 MED ORDER — METOPROLOL TARTRATE 12.5 MG HALF TABLET
12.5000 mg | ORAL_TABLET | Freq: Two times a day (BID) | ORAL | Status: DC
  Administered 2012-11-30 – 2012-12-01 (×3): 12.5 mg via ORAL
  Filled 2012-11-30 (×4): qty 1

## 2012-11-30 NOTE — Progress Notes (Signed)
Multiple BMs. Denies n/v  abd stable  Adv diet Wean TPN Probably home in AM  [place]. Andrey Campanile, MD, FACS General, Bariatric, & Minimally Invasive Surgery [facility], [place]

## 2012-11-30 NOTE — Progress Notes (Signed)
8 Days Post-Op  Subjective: Pt. Reports doing excellent today.  He is very thankful, optimistic and ready to go home.  Tolerating clear liquid diet.  Denies nausea or vomiting.  Reports having a large BM today.  Voiding without any difficulties.  PCA discontinued 4/14, pain is under good control.  Ambulating in hallways.  Denies abdominal pain.  Denies fever, chills or sweats.  Objective: Vital signs in last 24 hours: Temp:  [98.4 F (36.9 C)-98.8 F (37.1 C)] 98.4 F (36.9 C) (04/15 0509) Pulse Rate:  [102-108] 105 (04/15 0509) Resp:  [16-24] 20 (04/15 0509) BP: (134-149)/(71-86) 142/82 mmHg (04/15 0509) SpO2:  [97 %-100 %] 100 % (04/15 0509) Last BM Date: 11/28/12  Intake/Output from previous day: 04/14 0701 - 04/15 0700 In: 240 [P.O.:240] Out: -  Intake/Output this shift:    General appearance: alert, appears stated age and no distress Resp: coarse BLL, no crackles or wheezing.  Non labored breathing, no tachypnea or cyanosis. Cardio: regular rate and rhythm, S1, S2 normal, no murmur, click, rub or gallop GI: soft, non-tender, positive bowel sounds x4 quadrants.  mid abdominal incision-edges are well approximated, staples are  intact, no erythema or drainage.  No organomegaly, hernias or masses.  No tenderness or guarding. Extremities: extremities normal, atraumatic, no cyanosis or edema Pulses: 2+ and symmetric Scrotum: right, malodorous exudate which is minimal.  Adequate granulation tissue.  Lab Results:   Recent Labs  11/29/12 0510  WBC 5.9  HGB 9.9*  HCT 29.7*  PLT 218   BMET  Recent Labs  11/29/12 0510 11/30/12 0500  NA 141 139  K 3.9 3.6  CL 108 106  CO2 24 22  GLUCOSE 173* 161*  BUN 14 15  CREATININE 0.90 0.92  CALCIUM 9.2 9.0    Studies/Results: No results found.  Anti-infectives: Anti-infectives   Start     Dose/Rate Route Frequency Ordered Stop   11/22/12 1200  [MAR Hold]  cefOXitin (MEFOXIN) 2 g in dextrose 5 % 50 mL IVPB     (On MAR Hold  since 11/22/12 1152)   2 g 100 mL/hr over 30 Minutes Intravenous  Once 11/22/12 1121 11/22/12 1225      Assessment/Plan: S/p Ladd procedure for malrotation w/ post op ileus(Dr. Dwain Sarna 11/22/12)  -ileus has resolved.  He is doing much better.  PCA discontinued on 4/14.  His pain is under good control.  -Tolerating clear liquid, advance to full liquid. -Pt. Is ambulating in the hallways with wife.  PT no longer following, will not need home PT following discharge.  -Continue with IS  -Wean off TNA per pharmacy  -POD 9--will remove staples tomorrow.   Hypertension and tachycardia  -stop IV metoprolol and resume home dose along with lisinopril.  Continue with telemetry monitoring to ensure heart rate remains stable.  Repeat BMP in AM to ensure K and sCr remain stable as well.  Diabetes Mellitus  -sliding scale and QID BGs  -resume metformin -PCP follow up upon discharge.   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  I cleansed the wound which was followed by a wet to dry dressing. -will need to get a hold of oncology regarding treatments.  According to pt. His treatment is not due until beginning of May.  VTE prophylaxis  -SCD and lovenox -Ambulate       LOS: 9 days    Lena Gores, [facility] 11/30/2012

## 2012-11-30 NOTE — Progress Notes (Signed)
NUTRITION FOLLOW UP  Intervention:    Ensure Complete twice daily as tolerated (350 kcals, 13 gm protein per 8 fl oz bottle) RD to follow for nutrition care plan  Nutrition Dx:   Inadequate oral intake related to altered GI function as evidenced by malrotation causing emesis, improved  Goal:   Oral intake with meals & supplements to meet >/= 90% of estimated nutrition needs, progressing  Monitor:   PO & supplemental intake, weight, labs, I/O's  Assessment:   Patient's diet advanced from Clear Liquids to Full Liquids this AM.  Patient reports having lunch.  + nausea & diarrhea, no vomiting.  NGT discontinued.  TPN to decrease to 40 ml/hr, then to discontinue.  Possible D/C tomorrow AM.  Height: Ht Readings from Last 1 Encounters:  11/21/12 6' (1.829 m)    Weight Status:   Wt Readings from Last 1 Encounters:  11/21/12 245 lb (111.131 kg)    Re-estimated needs:  Kcal: 2200-2500 Protein: 100-120 gm Fluid: 2.2-2.5 L  Skin: abdominal & perineum surgical incisions  Diet Order: Full Liquid   Intake/Output Summary (Last 24 hours) at 11/30/12 1500 Last data filed at 11/30/12 1400  Gross per 24 hour  Intake   1740 ml  Output      0 ml  Net   1740 ml    Last BM: 4/15  Labs:   Recent Labs Lab 11/27/12 0600 11/28/12 0500 11/29/12 0510 11/30/12 0500  NA 142 144 141 139  K 3.7 3.4* 3.9 3.6  CL 107 107 108 106  CO2 28 26 24 22   BUN 10 10 14 15   CREATININE 0.94 0.96 0.90 0.92  CALCIUM 8.6 9.0 9.2 9.0  MG 1.7 2.1 1.8 1.9  PHOS 3.0 3.8 3.6  --   GLUCOSE 168* 170* 173* 161*    CBG (last 3)   Recent Labs  11/30/12 0333 11/30/12 0740 11/30/12 1110  GLUCAP 162* 165* 163*    Scheduled Meds: . antiseptic oral rinse  15 mL Mouth Rinse q12n4p  . enoxaparin (LOVENOX) injection  40 mg Subcutaneous Daily  . insulin aspart  0-15 Units Subcutaneous TID WC  . lisinopril  20 mg Oral Daily  . metFORMIN  1,000 mg Oral BID WC  . metoprolol tartrate  12.5 mg Oral BID   . pantoprazole  40 mg Oral Q1200    Continuous Infusions: . sodium chloride 50 mL/hr at 11/30/12 1123  . Marland KitchenTPN (CLINIMIX-E) Adult 40 mL/hr at 11/30/12 1208   And  . fat emulsion 240 mL (11/29/12 1706)    Maureen Chatters, RD, LDN Pager #: [phone] After-Hours Pager #: [phone]

## 2012-11-30 NOTE — Progress Notes (Signed)
PARENTERAL NUTRITION CONSULT NOTE - Follow Up   Pharmacy Consult: TPN Indication:  ileus   No Known Allergies  Patient Measurements: Height: 6' (182.9 cm) Weight: 245 lb (111.131 kg) IBW/kg (Calculated) : 77.6  Vital Signs: Temp: 98.8 F (37.1 C) (04/15 0830) Temp src: Oral (04/15 0830) BP: 142/88 mmHg (04/15 0830) Pulse Rate: 92 (04/15 0830) Intake/Output from previous day: 04/14 0701 - 04/15 0700 In: 240 [P.O.:240] Out: -   Labs:  Recent Labs  11/29/12 0510  WBC 5.9  HGB 9.9*  HCT 29.7*  PLT 218     Recent Labs  11/28/12 0500 11/29/12 0510 11/30/12 0500  NA 144 141 139  K 3.4* 3.9 3.6  CL 107 108 106  CO2 26 24 22   GLUCOSE 170* 173* 161*  BUN 10 14 15   CREATININE 0.96 0.90 0.92  CALCIUM 9.0 9.2 9.0  MG 2.1 1.8 1.9  PHOS 3.8 3.6  --   PROT  --  7.0  --   ALBUMIN  --  2.4*  --   AST  --  61*  --   ALT  --  84*  --   ALKPHOS  --  85  --   BILITOT  --  0.3  --   PREALBUMIN  --  11.0*  --   TRIG  --  121  --   CHOL  --  139  --    Estimated Creatinine Clearance: 112.7 ml/min (by C-G formula based on Cr of 0.92).    Recent Labs  11/29/12 2327 11/30/12 0333 11/30/12 0740  GLUCAP 156* 162* 165*        Insulin Requirements in the past 12 hours:  22 units moderate SSI + 40 units regular insulin in TPN + metformin  Current Nutrition:  Full liquid diet + Clinimix E 5/20 @ 80 ml/hr  Clinimix E 5/20 @ 100 ml/hr will provide 120 gm protein and aver 2318kCal (with lipids MWF)  Nutritional Goals:  2200-2500 KCal, 100-120 gm protein  Assessment:  Ileus s/p exp lap, appendectomy and Ladd's procedure for malrotation on 11/22/12.  GI: ileus resolving, NGT d/c'ed, BM x 2.  On IV Protonix Endo: h/o DM - CBGs acceptable, on SSI + insulin in TPN + metformin Lytes: WNL (K+ acceptable as ileus resolving) Renal: SCr stable, I/O's inaccurate, NS at 50 ml/hr Pulm: stable on RA Cards: hx HTN - mildly elevated BP/HR - on lisinopril, Lopressor Hepatobil:  noted LFTs increased, TG/TC WNL.  Prealbumin trending up, although still low for goal - expect steady progress now that post-op. ID: afebrile, WBC WNL - not on abx Best Practices: Lovenox, PPI TPN Access: PICC line  4/11 TPN day#: 4   Plan:  - Decrease TPN to 40 ml/hr, then d/c post this bag - Change Protonix to PO - Adjust SSI     Josedaniel Haye D. Laney Potash, PharmD, BCPS Pager:  [phone] 11/30/2012, 11:18 AM

## 2012-12-01 LAB — BASIC METABOLIC PANEL
CO2: 22 mEq/L (ref 19–32)
Calcium: 9 mg/dL (ref 8.4–10.5)
GFR calc non Af Amer: 80 mL/min — ABNORMAL LOW (ref >90)
Potassium: 4.1 mEq/L (ref 3.5–5.1)
Sodium: 140 mEq/L (ref 135–145)

## 2012-12-01 LAB — CBC
MCH: 29.2 pg (ref 26.0–34.0)
MCHC: 33.6 g/dL (ref 30.0–36.0)
Platelets: 267 10*3/uL (ref 150–400)
RBC: 3.42 MIL/uL — ABNORMAL LOW (ref 4.22–5.81)

## 2012-12-01 LAB — GLUCOSE, CAPILLARY

## 2012-12-01 MED ORDER — LISINOPRIL 20 MG PO TABS: 20.0000 mg | ORAL_TABLET | Freq: Every day | ORAL | Status: DC

## 2012-12-01 MED ORDER — METFORMIN HCL 1000 MG PO TABS: 1000.0000 mg | ORAL_TABLET | Freq: Two times a day (BID) | ORAL | Status: DC

## 2012-12-01 MED ORDER — PANTOPRAZOLE SODIUM 40 MG PO TBEC: 40.0000 mg | DELAYED_RELEASE_TABLET | Freq: Every day | ORAL | Status: DC

## 2012-12-01 MED ORDER — ENSURE COMPLETE PO LIQD: 237.0000 mL | Freq: Two times a day (BID) | ORAL | Status: DC

## 2012-12-01 NOTE — Progress Notes (Signed)
Discharge instructions gone over with patient and spouse. Prescriptions were faxed to patient's pharmacy. Home  Medications gone over with patient. Follow up appointments with doctors to be made. Signs and symptoms of infection gone over. Diet, activity and incisional care gone over. Patient verbalized understanding of instructions.

## 2012-12-01 NOTE — Progress Notes (Addendum)
Physical Therapy Treatment and Discharge Patient Details Name: Theodore Flores MRN: [id number] DOB: 1955/11/29 Today's Date: 12/01/2012 Time: 3086-5784 PT Time Calculation (min): 11 min  PT Assessment / Plan / Recommendation Comments on Treatment Session  S/p abd. surgery. Has progressed nicely with ambulation. Independent with all mobility. Will haev assist from wife on d/c. No further PT needs.     Follow Up Recommendations  No PT follow up     Does the patient have the potential to tolerate intense rehabilitation     Barriers to Discharge        Equipment Recommendations  None recommended by PT    Recommendations for Other Services    Frequency     Plan Discharge plan remains appropriate;All goals met and education completed, patient dischaged from PT services    Precautions / Restrictions Precautions Precautions: None Restrictions Weight Bearing Restrictions: No   Pertinent Vitals/Pain Reports minimal pain in lower abdomen where the nurse removed his stiches    Mobility  Bed Mobility Bed Mobility: Rolling Left;Left Sidelying to Sit Rolling Left: 6: Modified independent (Device/Increase time) Left Sidelying to Sit: 6: Modified independent (Device/Increase time) Details for Bed Mobility Assistance: inital cues for rolling with flat bed, able to perform independently Transfers Transfers: Sit to Stand;Stand to Sit Sit to Stand: 7: Independent;From bed Stand to Sit: 7: Independent;To chair/3-in-1 Ambulation/Gait Ambulation/Gait Assistance: 7: Independent;6: Modified independent (Device/Increase time) Ambulation Distance (Feet): 200 Feet Assistive device: Rolling walker;None Ambulation/Gait Assistance Details: initially walking wth RW but picking it up when turning and not losing balance, then proceeded to ambulate without RW with good independence and stability, able to shift weight quicky to perform basketball dribbling type movements without difficulty Gait Pattern:  Within Functional Limits Gait velocity: WFL      PT Goals Acute Rehab PT Goals PT Goal: Supine/Side to Sit - Progress: Met PT Goal: Sit to Stand - Progress: Met PT Goal: Stand to Sit - Progress: Met PT Goal: Ambulate - Progress: Met  Visit Information  Last PT Received On: 12/01/12 Assistance Needed: +1    Subjective Data  Subjective: I feel like I could jump up and touch the sky.  Patient Stated Goal: home today   Cognition  Cognition Arousal/Alertness: Awake/alert Overall Cognitive Status: Within Functional Limits for tasks assessed    Balance     End of Session PT - End of Session Activity Tolerance: Patient tolerated treatment well Patient left: in chair;with call bell/phone within reach;with family/visitor present Nurse Communication: Mobility status   GP     [facility] 12/01/2012, 11:49 AM

## 2012-12-01 NOTE — Discharge Summary (Signed)
Ajit Errico M. Lerae Langham, MD, FACS General, Bariatric, & Minimally Invasive Surgery [facility] [place]  

## 2012-12-01 NOTE — Discharge Summary (Signed)
Physician Discharge Summary  Patient ID: Theodore Flores MRN: [id number] DOB/AGE: [birth year] 57 y.o.  Admit date: 11/21/2012 Discharge date: 12/01/2012  Admission Diagnoses: small bowel obstruction secondary to malrotation                   nausea and vomiting         squamous cell cancer of scrotum  Discharge Diagnoses: Small bowel obstruction        Malrotation of intestine        Exploratory laparotomy, Ladds procedure        Appendectomy        Post operative ileus        Squamous Cell Carcinoma of Scrotum        Diabetes Mellitus        Hypertension   Discharged Condition: stable  Hospital Course: Theodore Flores is a pleasant 57 year old African American male with a past medical history of hypertension, diabetes mellitus who was undergoing radiation treatment(cisplatin) for scrotal squamous cell carcinoma at [facility]. He was diagnosed with malrotation on staging scans for the carcinoma.  He was seen by surgery who recommended follow up if symptoms developed.   He presented to [facility] ED on 11/21/12 with complains of nausea, vomiting and abdominal distention.  He has 2L of bilious emesis and was admitted for furhter evaluation.  CT of abdomen and pelvis upon admission revealed malrotation of bowel resulting in twisting and obstruction at the junction of the distal duodenum and jejunum.  Labratory evaluation was unrevealing.  NGT placed and NPO status.  He was treated with IV fluids and IV pain medication. After reviewing CT scans, Dr. Dwain Flores felt a Ladds procedure was necessary and the patient  subsequently underwent an exploratory laparotomy, appendectomy and Ladds procedure dated 11/22/12.  Post operatively, he developed an ileus which was treated with NGT, bowel rest, TNA and PCA pump for pain control.  Throughout the admission, he has tachycardia which improved with IV metoprolol on POD 9 he was converted back to home dose of PO metoprolol.  His heart rate normalized and blood pressure improved with the  reintroduction of lisinopril.  BG were monitored and the patient was maintained on sliding scale, metformin was reintroduced on POD 9 as well.  A BMET the following day was normal.  He continued to improve working with physical therapy.  NGT  Was discontinued on 11/29/13 and his diet was increased as tolerated.  Pharmacy weaned off TPN.  His PCA was also discontinued and his pain was maintained with oral medication.  For the Squamous Cell CA of scrotum, his onocologist at [facility] was contacted.  We continued wet to dry dressing changes q6h.  We once again contacted his oncologist.  The patient will follow up in his clinic on Monday 12/06/12.  The patient is aware that the clinic with contact him for further information.  He will also schedule a follow up with his PCP in 1-2 weeks.  POD 10 staples were removed without any difficulties.  Edges of abdominal wound are well approximated, healing without signs of erythema or discharge.  Steri-strips were applied.  Home care instructions were provided to Theodore Flores and his wife.  Furthermore, both were advised to contact our clinic should drainage, erythema or dehisced abdominal incision develop.   The patient was advised to refrain from straining, lifting >20lbs for 3 weeks and >40lbs for 6 weeks.  A follow up appointment with Dr. Dwain Flores will be made for the patient.  His medication were reconciled.  I advised the patient to stop taking norco as he is taking morphine and oxycodone.  The patient states he does not need a refill on him medication.  His pain is under very good control.  He is ambulating without any assistance.  His wife is at bedside and will be doing dressing changes.   Consults: none  Significant Diagnostic Studies:   CT of abdomen and pelvis(11/21/12): IMPRESSION:  1. Malrotation of bowel resulting in twisting and obstruction at  the junction of the distal duodenum and jejunum. Distended stomach  and distended duodenum noted.  2. Subjective  increase in size of the right scrotal mass.  3. Subtle enhancement in segment 5 of the liver as on prior chest  CT. Although this may well represent hemangioma, and was not  obviously hypermetabolic on PET CT, this merits attention on follow-  up studies.  4. 3 mm right basilar pulmonary nodule also merits observation.  5. Bilateral renal cysts.  6. Right adrenal adenoma.  7. Suspected mild distal esophagitis.  PORTABLE ABDOMEN - 1 VIEW(11/28/12)  Comparison: 11/26/2012  Findings: Nasogastric tube is in the left upper abdomen and  presumably in the stomach. There is a small amount of gas within  the left colon with stool. Otherwise, there is minimal bowel gas.  Surgical staples along the midline.  IMPRESSION:  There is minimal bowel gas as described. Small amount of gas in  the left colon. The findings are nonspecific.  Nasogastric tube is in the stomach region.   Treatments: exploratory   Discharge Exam: Blood pressure 146/79, pulse 99, temperature 98.5 F (36.9 C), temperature source Oral, resp. rate 16, height 6' (1.829 m), weight 245 lb (111.131 kg), SpO2 100.00%. General appearance: alert, cooperative and no distress Resp: clear to auscultation bilaterally Cardio: regular rate and rhythm, S1, S2 normal, no murmur, click, rub or gallop GI: +BS, soft, round and nontender.  No organomegaly, hernias or masses.  Staples removed, every other.  Edges approximated and felt it was okay to remove remainder and apply steristrips.  No drainage, or erthema. Skin: Skin color, texture, turgor normal. No rashes or lesions Neurologic: Alert and oriented X 3, normal strength and tone. Normal symmetric reflexes. Normal coordination and gait Scrotal wound: malodor, good granulation tissue, areas of necrosis.  Cleansed, wet to dry dressing applied.  Disposition: 04-Intermediate Care Facility     Medication List    STOP taking these medications       HYDROcodone-acetaminophen 5-325 MG per  tablet  Commonly known as:  NORCO/VICODIN      TAKE these medications       docusate sodium 100 MG capsule  Commonly known as:  COLACE  Take 100 mg by mouth 2 (two) times daily.     feeding supplement Liqd  Take 237 mLs by mouth 2 (two) times daily between meals.     lisinopril 20 MG tablet  Commonly known as:  PRINIVIL,ZESTRIL  Take 1 tablet (20 mg total) by mouth daily.     metFORMIN 1000 MG tablet  Commonly known as:  GLUCOPHAGE  Take 1 tablet (1,000 mg total) by mouth 2 (two) times daily with a meal.     metoprolol tartrate 12.5 mg Tabs  Commonly known as:  LOPRESSOR  Take 0.5 tablets (12.5 mg total) by mouth 2 (two) times daily.     morphine 15 MG tablet  Commonly known as:  MSIR  Take 15 mg by mouth every 4 (four) hours as  needed for pain.     nystatin powder  Commonly known as:  MYCOSTATIN  Apply 1 g topically 4 (four) times daily.     ondansetron 4 MG tablet  Commonly known as:  ZOFRAN  Take 4 mg by mouth every 8 (eight) hours as needed for nausea.     oxyCODONE 5 MG immediate release tablet  Commonly known as:  Oxy IR/ROXICODONE  Take 5-10 mg by mouth every 4 (four) hours as needed for pain.     pantoprazole 40 MG tablet  Commonly known as:  PROTONIX  Take 1 tablet (40 mg total) by mouth daily.     promethazine 25 MG tablet  Commonly known as:  PHENERGAN  Take 25 mg by mouth every 6 (six) hours as needed for nausea.     senna 8.6 MG Tabs  Commonly known as:  SENOKOT  Take 1 tablet by mouth 2 (two) times daily.           Follow-up Information   Follow up with [facility], MD In 2 weeks. (Our office will contact you to schedule a follow up appt with Dr. Dwain Flores)    Contact information:   [address] [place] [address] [phone]       Follow up with Default, Provider, MD In 1 week. (Follow up with your primary care doctor in 1-2 weeks.)    Contact information:   [address] [place] [address] [phone]        Follow up with MILOWSKY, MATTHEW I, MD. Call in 5 days. (Dr. Benn Moulder office will contact you.  If you do not hear from them by tomorrow, please give call.)    Contact information:   [address] [place] [address] [phone]       Signed: Ashok Norris 12/01/2012, 9:25 AM

## 2012-12-14 ENCOUNTER — Encounter (INDEPENDENT_AMBULATORY_CARE_PROVIDER_SITE_OTHER): Payer: Self-pay | Admitting: General Surgery

## 2012-12-14 ENCOUNTER — Ambulatory Visit (INDEPENDENT_AMBULATORY_CARE_PROVIDER_SITE_OTHER): Payer: 59 | Admitting: General Surgery

## 2012-12-14 VITALS — BP 120/68 | HR 120 | Temp 98.4°F | Resp 18 | Ht 72.0 in | Wt 218.8 lb

## 2012-12-14 DIAGNOSIS — Z09 Encounter for follow-up examination after completed treatment for conditions other than malignant neoplasm: Secondary | ICD-10-CM

## 2012-12-14 NOTE — Progress Notes (Signed)
Subjective:     Patient ID: Theodore Flores, male   DOB: Apr 04, 1956, 57 y.o.   MRN: [id number]  HPI 57 yom who has scc of scrotum treated at [facility] with radiation/radiosensitizing chemo.  He had malrotation and developed symptoms of obstruction.  I did a Ladds procedure (I did remove his appendix) about 3 weeks ago.  He is doing well without complaint. He is having bms, passing flatus and eating well.  Review of Systems     Objective:   Physical Exam Abdomen soft, nontender, wound clean without infection    Assessment:     S/p ladds procedure     Plan:     He is doing well. I told him at one month he can resume activity completely. He can see me as needed.  We discussed procedure and fact he will not get appendicitis

## 2012-12-20 ENCOUNTER — Encounter (INDEPENDENT_AMBULATORY_CARE_PROVIDER_SITE_OTHER): Payer: 59 | Admitting: General Surgery

## 2012-12-28 ENCOUNTER — Other Ambulatory Visit (HOSPITAL_COMMUNITY): Payer: Self-pay | Admitting: General Surgery

## 2012-12-29 ENCOUNTER — Other Ambulatory Visit (HOSPITAL_COMMUNITY): Payer: Self-pay | Admitting: General Surgery

## 2012-12-29 NOTE — Telephone Encounter (Signed)
This is a patient of Dr.Wakefield's please advise

## 2013-01-03 NOTE — Telephone Encounter (Signed)
pls advise..patient of [facility]...sent this to you about a week ago

## 2013-01-31 ENCOUNTER — Encounter: Payer: Self-pay | Admitting: Internal Medicine

## 2013-03-10 ENCOUNTER — Ambulatory Visit: Payer: 59 | Attending: Family Medicine | Admitting: Family Medicine

## 2013-03-10 VITALS — BP 130/86 | HR 75 | Temp 98.3°F | Resp 17 | Wt 222.8 lb

## 2013-03-10 DIAGNOSIS — E119 Type 2 diabetes mellitus without complications: Secondary | ICD-10-CM

## 2013-03-10 DIAGNOSIS — I10 Essential (primary) hypertension: Secondary | ICD-10-CM

## 2013-03-10 DIAGNOSIS — E669 Obesity, unspecified: Secondary | ICD-10-CM

## 2013-03-10 DIAGNOSIS — E1169 Type 2 diabetes mellitus with other specified complication: Secondary | ICD-10-CM

## 2013-03-10 DIAGNOSIS — C632 Malignant neoplasm of scrotum: Secondary | ICD-10-CM

## 2013-03-10 LAB — COMPLETE METABOLIC PANEL WITH GFR
ALT: 10 U/L (ref 0–53)
AST: 12 U/L (ref 0–37)
Albumin: 3.9 g/dL (ref 3.5–5.2)
Alkaline Phosphatase: 64 U/L (ref 39–117)
BUN: 14 mg/dL (ref 6–23)
Potassium: 4.1 mEq/L (ref 3.5–5.3)

## 2013-03-10 LAB — CBC
HCT: 30.2 % — ABNORMAL LOW (ref 39.0–52.0)
Hemoglobin: 10 g/dL — ABNORMAL LOW (ref 13.0–17.0)
MCH: 29.9 pg (ref 26.0–34.0)
MCV: 90.1 fL (ref 78.0–100.0)
RBC: 3.35 MIL/uL — ABNORMAL LOW (ref 4.22–5.81)

## 2013-03-10 LAB — LIPID PANEL
Cholesterol: 162 mg/dL (ref 0–200)
LDL Cholesterol: 102 mg/dL — ABNORMAL HIGH (ref 0–99)
Total CHOL/HDL Ratio: 4.8 Ratio
VLDL: 26 mg/dL (ref 0–40)

## 2013-03-10 LAB — MAGNESIUM: Magnesium: 1.8 mg/dL (ref 1.5–2.5)

## 2013-03-10 MED ORDER — METFORMIN HCL 1000 MG PO TABS: 1000.0000 mg | ORAL_TABLET | Freq: Two times a day (BID) | ORAL | Status: DC

## 2013-03-10 MED ORDER — PANTOPRAZOLE SODIUM 40 MG PO TBEC: 40.0000 mg | DELAYED_RELEASE_TABLET | Freq: Every day | ORAL | Status: DC

## 2013-03-10 MED ORDER — LISINOPRIL 20 MG PO TABS: 20.0000 mg | ORAL_TABLET | Freq: Every day | ORAL | Status: DC

## 2013-03-10 NOTE — Patient Instructions (Addendum)
Hypertension As your heart beats, it forces blood through your arteries. This force is your blood pressure. If the pressure is too high, it is called hypertension (HTN) or high blood pressure. HTN is dangerous because you may have it and not know it. High blood pressure may mean that your heart has to work harder to pump blood. Your arteries may be narrow or stiff. The extra work puts you at risk for heart disease, stroke, and other problems.  Blood pressure consists of two numbers, a higher number over a lower, 110/72, for example. It is stated as "110 over 72." The ideal is below 120 for the top number (systolic) and under 80 for the bottom (diastolic). Write down your blood pressure today. You should pay close attention to your blood pressure if you have certain conditions such as:  Heart failure.  Prior heart attack.  Diabetes  Chronic kidney disease.  Prior stroke.  Multiple risk factors for heart disease. To see if you have HTN, your blood pressure should be measured while you are seated with your arm held at the level of the heart. It should be measured at least twice. A one-time elevated blood pressure reading (especially in the Emergency Department) does not mean that you need treatment. There may be conditions in which the blood pressure is different between your right and left arms. It is important to see your caregiver soon for a recheck. Most people have essential hypertension which means that there is not a specific cause. This type of high blood pressure may be lowered by changing lifestyle factors such as:  Stress.  Smoking.  Lack of exercise.  Excessive weight.  Drug/tobacco/alcohol use.  Eating less salt. Most people do not have symptoms from high blood pressure until it has caused damage to the body. Effective treatment can often prevent, delay or reduce that damage. TREATMENT  When a cause has been identified, treatment for high blood pressure is directed at the  cause. There are a large number of medications to treat HTN. These fall into several categories, and your caregiver will help you select the medicines that are best for you. Medications may have side effects. You should review side effects with your caregiver. If your blood pressure stays high after you have made lifestyle changes or started on medicines,   Your medication(s) may need to be changed.  Other problems may need to be addressed.  Be certain you understand your prescriptions, and know how and when to take your medicine.  Be sure to follow up with your caregiver within the time frame advised (usually within two weeks) to have your blood pressure rechecked and to review your medications.  If you are taking more than one medicine to lower your blood pressure, make sure you know how and at what times they should be taken. Taking two medicines at the same time can result in blood pressure that is too low. SEEK IMMEDIATE MEDICAL CARE IF:  You develop a severe headache, blurred or changing vision, or confusion.  You have unusual weakness or numbness, or a faint feeling.  You have severe chest or abdominal pain, vomiting, or breathing problems. MAKE SURE YOU:   Understand these instructions.  Will watch your condition.  Will get help right away if you are not doing well or get worse. Document Released: 08/04/2005 Document Revised: 10/27/2011 Document Reviewed: 03/24/2008 ExitCare Patient Information 2014 [employer]. Blood Sugar Monitoring, Adult GLUCOSE METERS FOR SELF-MONITORING OF BLOOD GLUCOSE  It is important to   be able to correctly measure your blood sugar (glucose). You can use a blood glucose monitor (a small battery-operated device) to check your glucose level at any time. This allows you and your caregiver to monitor your diabetes and to determine how well your treatment plan is working. The process of monitoring your blood glucose with a glucose meter is called  self-monitoring of blood glucose (SMBG). When people with diabetes control their blood sugar, they have better health. To test for glucose with a typical glucose meter, place the disposable strip in the meter. Then place a small sample of blood on the "test strip." The test strip is coated with chemicals that combine with glucose in blood. The meter measures how much glucose is present. The meter displays the glucose level as a number. Several new models can record and store a number of test results. Some models can connect to personal computers to store test results or print them out.  Newer meters are often easier to use than older models. Some meters allow you to get blood from places other than your fingertip. Some new models have automatic timing, error codes, signals, or barcode readers to help with proper adjustment (calibration). Some meters have a large display screen or spoken instructions for people with visual impairments.  INSTRUCTIONS FOR USING GLUCOSE METERS  Wash your hands with soap and warm water, or clean the area with alcohol. Dry your hands completely.  Prick the side of your fingertip with a lancet (a sharp-pointed tool used by hand).  Hold the hand down and gently milk the finger until a small drop of blood appears. Catch the blood with the test strip.  Follow the instructions for inserting the test strip and using the SMBG meter. Most meters require the meter to be turned on and the test strip to be inserted before applying the blood sample.  Record the test result.  Read the instructions carefully for both the meter and the test strips that go with it. Meter instructions are found in the user manual. Keep this manual to help you solve any problems that may arise. Many meters use "error codes" when there is a problem with the meter, the test strip, or the blood sample on the strip. You will need the manual to understand these error codes and fix the problem.  New devices are  available such as laser lancets and meters that can test blood taken from "alternative sites" of the body, other than fingertips. However, you should use standard fingertip testing if your glucose changes rapidly. Also, use standard testing if:  You have eaten, exercised, or taken insulin in the past 2 hours.  You think your glucose is low.  You tend to not feel symptoms of low blood glucose (hypoglycemia).  You are ill or under stress.  Clean the meter as directed by the manufacturer.  Test the meter for accuracy as directed by the manufacturer.  Take your meter with you to your caregiver's office. This way, you can test your glucose in front of your caregiver to make sure you are using the meter correctly. Your caregiver can also take a sample of blood to test using a routine lab method. If values on the glucose meter are close to the lab results, you and your caregiver will see that your meter is working well and you are using good technique. Your caregiver will advise you about what to do if the results do not match. FREQUENCY OF TESTING  Your caregiver will   tell you how often you should check your blood glucose. This will depend on your type of diabetes, your current level of diabetes control, and your types of medicines. The following are general guidelines, but your care plan may be different. Record all your readings and the time of day you took them for review with your caregiver.   Diabetes type 1.  When you are using insulin with good diabetic control (either multiple daily injections or via a pump), you should check your glucose 4 times a day.  If your diabetes is not well controlled, you may need to monitor more frequently, including before meals and 2 hours after meals, at bedtime, and occasionally between 2 a.m. and 3 a.m.  You should always check your glucose before a dose of insulin or before changing the rate on your insulin pump.  Diabetes type 2.  Guidelines for SMBG  in diabetes type 2 are not as well defined.  If you are on insulin, follow the guidelines above.  If you are on medicines, but not insulin, and your glucose is not well controlled, you should test at least twice daily.  If you are not on insulin, and your diabetes is controlled with medicines or diet alone, you should test at least once daily, usually before breakfast.  A weekly profile will help your caregiver advise you on your care plan. The week before your visit, check your glucose before a meal and 2 hours after a meal at least daily. You may want to test before and after a different meal each day so you and your caregiver can tell how well controlled your blood sugars are throughout the course of a 24 hour period.  Gestational diabetes (diabetes during pregnancy).  Frequent testing is often necessary. Accurate timing is important.  If you are not on insulin, check your glucose 4 times a day. Check it before breakfast and 1 hour after the start of each meal.  If you are on insulin, check your glucose 6 times a day. Check it before each meal and 1 hour after the first bite of each meal.  General guidelines.  More frequent testing is required at the start of insulin treatment. Your caregiver will instruct you.  Test your glucose any time you suspect you have low blood sugar (hypoglycemia).  You should test more often when you change medicines, when you have unusual stress or illness, or in other unusual circumstances. OTHER THINGS TO KNOW ABOUT GLUCOSE METERS  Measurement Range. Most glucose meters are able to read glucose levels over a broad range of values from as low as 0 to as high as 600 mg/dL. If you get an extremely high or low reading from your meter, you should first confirm it with another reading. Report very high or very low readings to your caregiver.  Whole Blood Glucose versus Plasma Glucose. Some older home glucose meters measure glucose in your whole blood. In a lab  or when using some newer home glucose meters, the glucose is measured in your plasma (one component of blood). The difference can be important. It is important for you and your caregiver to know whether your meter gives its results as "whole blood equivalent" or "plasma equivalent."  Display of High and Low Glucose Values. Part of learning how to operate a meter is understanding what the meter results mean. Know how high and low glucose concentrations are displayed on your meter.  Factors that Affect Glucose Meter Performance. The accuracy of your test   results depends on many factors and varies depending on the brand and type of meter. These factors include:  Low red blood cell count (anemia).  Substances in your blood (such as uric acid, vitamin C, and others).  Environmental factors (temperature, humidity, altitude).  Name-brand versus generic test strips.  Calibration. Make sure your meter is set up properly. It is a good idea to do a calibration test with a control solution recommended by the manufacturer of your meter whenever you begin using a fresh bottle of test strips. This will help verify the accuracy of your meter.  Improperly stored, expired, or defective test strips. Keep your strips in a dry place with the lid on.  Soiled meter.  Inadequate blood sample. NEW TECHNOLOGIES FOR GLUCOSE TESTING Alternative site testing Some glucose meters allow testing blood from alternative sites. These include the:  Upper arm.  Forearm.  Base of the thumb.  Thigh. Sampling blood from alternative sites may be desirable. However, it may have some limitations. Blood in the fingertips show changes in glucose levels more quickly than blood in other parts of the body. This means that alternative site test results may be different from fingertip test results, not because of the meter's ability to test accurately, but because the actual glucose concentration can be different.  Continuous Glucose  Monitoring Devices to measure your blood glucose continuously are available, and others are in development. These methods can be more expensive than self-monitoring with a glucose meter. However, it is uncertain how effective and reliable these devices are. Your caregiver will advise you if this approach makes sense for you. IF BLOOD SUGARS ARE CONTROLLED, PEOPLE WITH DIABETES REMAIN HEALTHIER.  SMBG is an important part of the treatment plan of patients with diabetes mellitus. Below are reasons for using SMBG:   It confirms that your glucose is at a specific, healthy level.  It detects hypoglycemia and severe hyperglycemia.  It allows you and your caregiver to make adjustments in response to changes in lifestyle for individuals requiring medicine.  It determines the need for starting insulin therapy in temporary diabetes that happens during pregnancy (gestational diabetes). Document Released: 08/07/2003 Document Revised: 10/27/2011 Document Reviewed: 11/28/2010 Exit[employer] Patient Information 2014 [employer].   

## 2013-03-10 NOTE — Progress Notes (Signed)
Patient here to establish care Has history of squamous cell carcinoma Follows up with wound care has opening near the groin area

## 2013-03-10 NOTE — Progress Notes (Signed)
Patient ID: Theodore Flores, male   DOB: Jun 15, 1956, 57 y.o.   MRN: [id number]  CC:  Follow up   HPI: Pt is needing refills of BP meds and BS meds.  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 He has not been able to get his regular labs done in a while and does need refills of his diabetes medications.  His pain is managed by another physician.    No Known Allergies Past Medical History  Diagnosis Date  . Diabetes mellitus without complication   . Hypertension   . Mass of right inguinal region   . Squamous cell carcinoma   . Adrenal adenoma   . Sigmoid diverticulitis   . Skin cancer    Current Outpatient Prescriptions on File Prior to Visit  Medication Sig Dispense Refill  . docusate sodium (COLACE) 100 MG capsule Take 100 mg by mouth 2 (two) times daily.      . metoprolol tartrate (LOPRESSOR) 12.5 mg TABS Take 0.5 tablets (12.5 mg total) by mouth 2 (two) times daily.  30 tablet  0  . morphine (MSIR) 15 MG tablet Take 15 mg by mouth every 4 (four) hours as needed for pain.      Marland Kitchen nystatin (MYCOSTATIN) powder Apply 1 g topically 4 (four) times daily.      . ondansetron (ZOFRAN) 4 MG tablet Take 4 mg by mouth every 8 (eight) hours as needed for nausea.      Marland Kitchen oxyCODONE (OXY IR/ROXICODONE) 5 MG immediate release tablet Take 5-10 mg by mouth every 4 (four) hours as needed for pain.      . promethazine (PHENERGAN) 25 MG tablet Take 25 mg by mouth every 6 (six) hours as needed for nausea.      Marland Kitchen senna (SENOKOT) 8.6 MG TABS Take 1 tablet by mouth 2 (two) times daily.       No current facility-administered medications on file prior to visit.   Family History  Problem Relation Age of Onset  . CVA Father   . Hypertension Father   . Cancer - Other Mother     Doesnot know what type  . Hypertension Mother   . Hypertension Sister   . Hypertension Brother   . Diabetes Mother   . Diabetes Father   . Diabetes Brother    History   Social History   . Marital Status: Married    Spouse Name: N/A    Number of Children: 3  . Years of Education: N/A   Occupational History  . 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 Smoking status: Former Smoker -- 0.25 packs/day for 40 years  . Smokeless tobacco: Never Used  . Alcohol Use: No     Comment: Social  . Drug Use: No  . Sexually Active: Not Currently -- Male partner(s)   Other Topics Concern  . Not on file   Social History Narrative   Lives with wife   2 daughters and 1 son   Truck driver    Review of Systems  Constitutional: Negative for fever, chills, diaphoresis, activity change, appetite change and fatigue.  HENT: Negative for ear pain, nosebleeds, congestion, facial swelling, rhinorrhea, neck pain, neck stiffness and ear discharge.   Eyes: Negative for pain, discharge, redness, itching and visual disturbance.  Respiratory: Negative for cough, choking, chest tightness, shortness of breath, wheezing and stridor.   Cardiovascular: Negative for chest  pain, palpitations and leg swelling.  Gastrointestinal: Negative for abdominal distention.  Genitourinary: Negative for dysuria, urgency, frequency, hematuria, flank pain, decreased urine volume, difficulty urinating and dyspareunia.  Musculoskeletal: Negative for back pain, joint swelling, arthralgias and gait problem.  Neurological: Negative for dizziness, tremors, seizures, syncope, facial asymmetry, speech difficulty, weakness, light-headedness, numbness and headaches.  Hematological: Negative for adenopathy. Does not bruise/bleed easily.  Psychiatric/Behavioral: Negative for hallucinations, behavioral problems, confusion, dysphoric mood, decreased concentration and agitation.    Objective:   Filed Vitals:   03/10/13 1714  BP: 130/86  Pulse: 75  Temp: 98.3 F (36.8 C)  Resp: 17    Physical Exam  Constitutional: Appears well-developed and well-nourished. No distress.  HENT:  Normocephalic. External right and left ear normal. Oropharynx is clear and moist.  Eyes: Conjunctivae and EOM are normal. PERRLA, no scleral icterus.  Neck: Normal ROM. Neck supple. No JVD. No tracheal deviation. No thyromegaly.  CVS: RRR, S1/S2 +, no murmurs, no gallops, no carotid bruit.  Pulmonary: Effort and breath sounds normal, no stridor, rhonchi, wheezes, rales.  Abdominal: Soft. BS +,  no distension, tenderness, rebound or guarding.  Musculoskeletal: Normal range of motion. No edema and no tenderness. GU: foul smell from wound  Lymphadenopathy: No lymphadenopathy noted, cervical, inguinal. Neuro: Alert. Normal reflexes, muscle tone coordination. No cranial nerve deficit. Skin: Skin is warm and dry. No rash noted. Not diaphoretic. No erythema. No pallor.  Psychiatric: Normal mood and affect. Behavior, judgment, thought content normal.   Lab Results  Component Value Date   WBC 6.1 12/01/2012   HGB 10.0* 12/01/2012   HCT 29.8* 12/01/2012   MCV 87.1 12/01/2012   PLT 267 12/01/2012   Lab Results  Component Value Date   CREATININE 1.01 12/01/2012   BUN 16 12/01/2012   NA 140 12/01/2012   K 4.1 12/01/2012   CL 108 12/01/2012   CO2 22 12/01/2012    Lab Results  Component Value Date   HGBA1C 8.2* 11/22/2012   Lipid Panel     Component Value Date/Time   CHOL 139 11/29/2012 0510   TRIG 121 11/29/2012 0510       Assessment and plan:   Patient Active Problem List   Diagnosis Date Noted  . 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 Malignant neoplasm of scrotum 09/01/2012  . Hypertension 07/16/2012  . Diabetes mellitus type 2 in obese 07/16/2012   Hypertension - Plan: CBC, Lipid panel, COMPLETE METABOLIC PANEL WITH GFR, TSH, POCT glycosylated hemoglobin (Hb A1C), Magnesium, Obtain medical records  Diabetes mellitus type 2 in obese - Plan: CBC, Lipid panel, COMPLETE METABOLIC PANEL WITH GFR, TSH, POCT glycosylated hemoglobin (Hb A1C), Magnesium, Obtain medical records  Malignant  neoplasm of scrotum - Plan: CBC, Lipid panel, COMPLETE METABOLIC PANEL WITH GFR, TSH, POCT glycosylated hemoglobin (Hb A1C), Magnesium, Obtain medical records  Pt is to follow up with oncology physician team tomorrow  RTC in 3 months  Follow lab results  .The patient was given clear instructions to go to ER or return to medical center if symptoms don't improve, worsen or new problems develop.  The patient verbalized understanding.  The patient was told to call to get any lab results if not heard anything in the next week.    Rodney Langton, MD, CDE, FAAFP [facility] [place], [place]

## 2013-03-11 ENCOUNTER — Other Ambulatory Visit: Payer: Self-pay | Admitting: Family Medicine

## 2013-03-11 ENCOUNTER — Encounter: Payer: Self-pay | Admitting: Family Medicine

## 2013-03-11 ENCOUNTER — Telehealth: Payer: Self-pay | Admitting: *Deleted

## 2013-03-11 LAB — TSH: TSH: 1.399 u[IU]/mL (ref 0.350–4.500)

## 2013-03-11 MED ORDER — LISINOPRIL 10 MG PO TABS: 20.0000 mg | ORAL_TABLET | Freq: Every day | ORAL | Status: DC

## 2013-03-11 MED ORDER — METFORMIN HCL 1000 MG PO TABS: 500.0000 mg | ORAL_TABLET | Freq: Two times a day (BID) | ORAL | Status: DC

## 2013-03-11 NOTE — Telephone Encounter (Signed)
03/11/13 Patient not available spoke with wife Carolan Clines made aware of lab results WBC a little low, kidney function much worse . Start taking Metformin 500mg  Bid Lisinopril 10mg  daily. Recheck in 1 week  appt schedule for 03/18/13 [facility]

## 2013-03-11 NOTE — Progress Notes (Signed)
Quick Note:  Please inform patient that labs reveal that his WBC count is a little low and he is anemic but stable hemoglobin from 3 months ago. Also, his kidney function has gotten much worse in the past 3 months. I want him to cut his metformin dose and lisinopril dose by 50%. I want him to take metformin 500 mg po BID down from 1000mg  po bid and I want him to take lisinopril 10 mg daily instead of 20 mg because of his kidney function. I sent new presriptions for lower dose of medications to pharmacy. I want him to recheck his kidney function with a BMP in 1 week. Thyroid and magnesium labs came back normal.   Rodney Langton, MD, CDE, FAAFP [facility] [place], [place]   ______

## 2013-03-14 ENCOUNTER — Telehealth: Payer: Self-pay | Admitting: Family Medicine

## 2013-03-14 NOTE — Telephone Encounter (Signed)
[facility] pharmacist calling to confirm order for lisinipril.  Pt was told that her script was being decreased by 1/2.  Previously she was taking 20mg  now script says 10 mg but says take twice a day.  If dosage is decreasing by half she should be taking 1 per day.  Please call pharm to confirm intended script.

## 2013-03-15 ENCOUNTER — Telehealth: Payer: Self-pay | Admitting: *Deleted

## 2013-03-15 NOTE — Telephone Encounter (Signed)
03/15/13 Spoke with pharmacist Rob at [facility] confirmed according to Dr. Laural Benes note  03/11/13  He want  Patient to take Lisinopril 10mg  po daily.P[facility]

## 2013-03-18 ENCOUNTER — Other Ambulatory Visit: Payer: 59

## 2013-07-07 ENCOUNTER — Ambulatory Visit: Payer: 59

## 2013-07-12 ENCOUNTER — Ambulatory Visit: Payer: 59

## 2013-09-14 ENCOUNTER — Encounter: Payer: Self-pay | Admitting: Internal Medicine

## 2013-11-01 ENCOUNTER — Emergency Department (HOSPITAL_COMMUNITY): Payer: 59

## 2013-11-01 ENCOUNTER — Ambulatory Visit (HOSPITAL_BASED_OUTPATIENT_CLINIC_OR_DEPARTMENT_OTHER): Payer: 59 | Admitting: Internal Medicine

## 2013-11-01 ENCOUNTER — Inpatient Hospital Stay (HOSPITAL_COMMUNITY)
Admission: EM | Admit: 2013-11-01 | Discharge: 2013-11-03 | DRG: 176 | Disposition: A | Discharge status: Home / Self Care | Payer: 59 | Attending: Internal Medicine | Admitting: Internal Medicine

## 2013-11-01 ENCOUNTER — Encounter (HOSPITAL_COMMUNITY): Payer: Self-pay | Admitting: Emergency Medicine

## 2013-11-01 VITALS — BP 128/77 | HR 102 | Temp 97.6°F | Ht 73.0 in | Wt 241.2 lb

## 2013-11-01 DIAGNOSIS — E669 Obesity, unspecified: Secondary | ICD-10-CM | POA: Diagnosis present

## 2013-11-01 DIAGNOSIS — R002 Palpitations: Secondary | ICD-10-CM | POA: Diagnosis present

## 2013-11-01 DIAGNOSIS — R0609 Other forms of dyspnea: Secondary | ICD-10-CM | POA: Diagnosis present

## 2013-11-01 DIAGNOSIS — I10 Essential (primary) hypertension: Secondary | ICD-10-CM

## 2013-11-01 DIAGNOSIS — C632 Malignant neoplasm of scrotum: Secondary | ICD-10-CM | POA: Diagnosis present

## 2013-11-01 DIAGNOSIS — E1169 Type 2 diabetes mellitus with other specified complication: Secondary | ICD-10-CM | POA: Diagnosis present

## 2013-11-01 DIAGNOSIS — R000 Tachycardia, unspecified: Secondary | ICD-10-CM | POA: Diagnosis present

## 2013-11-01 DIAGNOSIS — Z87891 Personal history of nicotine dependence: Secondary | ICD-10-CM

## 2013-11-01 DIAGNOSIS — R5383 Other fatigue: Secondary | ICD-10-CM

## 2013-11-01 DIAGNOSIS — Z794 Long term (current) use of insulin: Secondary | ICD-10-CM

## 2013-11-01 DIAGNOSIS — I2699 Other pulmonary embolism without acute cor pulmonale: Principal | ICD-10-CM | POA: Diagnosis present

## 2013-11-01 DIAGNOSIS — Z823 Family history of stroke: Secondary | ICD-10-CM

## 2013-11-01 DIAGNOSIS — R532 Functional quadriplegia: Secondary | ICD-10-CM

## 2013-11-01 DIAGNOSIS — R0989 Other specified symptoms and signs involving the circulatory and respiratory systems: Secondary | ICD-10-CM | POA: Diagnosis present

## 2013-11-01 DIAGNOSIS — D6489 Other specified anemias: Secondary | ICD-10-CM | POA: Diagnosis present

## 2013-11-01 DIAGNOSIS — R739 Hyperglycemia, unspecified: Secondary | ICD-10-CM

## 2013-11-01 DIAGNOSIS — R5381 Other malaise: Secondary | ICD-10-CM | POA: Diagnosis present

## 2013-11-01 DIAGNOSIS — R7309 Other abnormal glucose: Secondary | ICD-10-CM

## 2013-11-01 DIAGNOSIS — E119 Type 2 diabetes mellitus without complications: Secondary | ICD-10-CM

## 2013-11-01 DIAGNOSIS — R0602 Shortness of breath: Secondary | ICD-10-CM | POA: Diagnosis present

## 2013-11-01 DIAGNOSIS — Z833 Family history of diabetes mellitus: Secondary | ICD-10-CM

## 2013-11-01 DIAGNOSIS — I2510 Atherosclerotic heart disease of native coronary artery without angina pectoris: Secondary | ICD-10-CM | POA: Diagnosis present

## 2013-11-01 DIAGNOSIS — Z85828 Personal history of other malignant neoplasm of skin: Secondary | ICD-10-CM

## 2013-11-01 DIAGNOSIS — Z8249 Family history of ischemic heart disease and other diseases of the circulatory system: Secondary | ICD-10-CM

## 2013-11-01 DIAGNOSIS — D649 Anemia, unspecified: Secondary | ICD-10-CM | POA: Diagnosis present

## 2013-11-01 LAB — URINALYSIS, ROUTINE W REFLEX MICROSCOPIC
Bilirubin Urine: NEGATIVE
Glucose, UA: >1000 mg/dL — AB
Ketones, ur: NEGATIVE mg/dL
Leukocytes, UA: NEGATIVE
Nitrite: NEGATIVE
Protein, ur: NEGATIVE mg/dL
Specific Gravity, Urine: 1.041 — ABNORMAL HIGH (ref 1.005–1.030)
Urobilinogen, UA: 0.2 mg/dL (ref 0.0–1.0)
pH: 5 (ref 5.0–8.0)

## 2013-11-01 LAB — CBC WITH DIFFERENTIAL/PLATELET
Basophils Absolute: 0 10*3/uL (ref 0.0–0.1)
Basophils Relative: 0 % (ref 0–1)
Eosinophils Absolute: 0 10*3/uL (ref 0.0–0.7)
Eosinophils Relative: 0 % (ref 0–5)
HCT: 29.4 % — ABNORMAL LOW (ref 39.0–52.0)
Hemoglobin: 9.5 g/dL — ABNORMAL LOW (ref 13.0–17.0)
Lymphocytes Relative: 13 % (ref 12–46)
Lymphs Abs: 0.9 10*3/uL (ref 0.7–4.0)
MCH: 27.9 pg (ref 26.0–34.0)
MCHC: 32.3 g/dL (ref 30.0–36.0)
MCV: 86.5 fL (ref 78.0–100.0)
Monocytes Absolute: 0.5 10*3/uL (ref 0.1–1.0)
Monocytes Relative: 7 % (ref 3–12)
Neutro Abs: 5.6 10*3/uL (ref 1.7–7.7)
Neutrophils Relative %: 80 % — ABNORMAL HIGH (ref 43–77)
Platelets: 268 10*3/uL (ref 150–400)
RBC: 3.4 MIL/uL — ABNORMAL LOW (ref 4.22–5.81)
RDW: 13.8 % (ref 11.5–15.5)
WBC: 7 10*3/uL (ref 4.0–10.5)

## 2013-11-01 LAB — COMPREHENSIVE METABOLIC PANEL
ALT: 16 U/L (ref 0–53)
AST: 21 U/L (ref 0–37)
Albumin: 3.1 g/dL — ABNORMAL LOW (ref 3.5–5.2)
Alkaline Phosphatase: 92 U/L (ref 39–117)
BUN: 20 mg/dL (ref 6–23)
CO2: 22 mEq/L (ref 19–32)
Calcium: 10 mg/dL (ref 8.4–10.5)
Chloride: 99 mEq/L (ref 96–112)
Creatinine, Ser: 1.34 mg/dL (ref 0.50–1.35)
GFR calc Af Amer: 65 mL/min — ABNORMAL LOW (ref >90)
GFR calc non Af Amer: 56 mL/min — ABNORMAL LOW (ref >90)
Glucose, Bld: 330 mg/dL — ABNORMAL HIGH (ref 70–99)
Potassium: 4.9 mEq/L (ref 3.7–5.3)
Sodium: 137 mEq/L (ref 137–147)
Total Bilirubin: <0.2 mg/dL — ABNORMAL LOW (ref 0.3–1.2)
Total Protein: 8.4 g/dL — ABNORMAL HIGH (ref 6.0–8.3)

## 2013-11-01 LAB — URINE MICROSCOPIC-ADD ON

## 2013-11-01 LAB — POCT GLYCOSYLATED HEMOGLOBIN (HGB A1C): HEMOGLOBIN A1C: 8.4

## 2013-11-01 MED ORDER — LISINOPRIL 20 MG PO TABS
20.0000 mg | ORAL_TABLET | Freq: Every day | ORAL | Status: DC
  Administered 2013-11-02 – 2013-11-03 (×2): 20 mg via ORAL
  Filled 2013-11-01 (×2): qty 1

## 2013-11-01 MED ORDER — IOHEXOL 350 MG/ML SOLN
100.0000 mL | Freq: Once | INTRAVENOUS | Status: AC | PRN
  Administered 2013-11-01: 100 mL via INTRAVENOUS

## 2013-11-01 MED ORDER — OXYCODONE HCL 5 MG PO TABS
5.0000 mg | ORAL_TABLET | ORAL | Status: DC | PRN
Start: 2013-11-01 — End: 2013-11-03
  Administered 2013-11-02 – 2013-11-03 (×7): 10 mg via ORAL
  Filled 2013-11-01 (×7): qty 2

## 2013-11-01 MED ORDER — SODIUM CHLORIDE 0.9 % IV SOLN: 250.0000 mL | INTRAVENOUS | Status: DC | PRN

## 2013-11-01 MED ORDER — SODIUM CHLORIDE 0.9 % IJ SOLN: 3.0000 mL | INTRAMUSCULAR | Status: DC | PRN

## 2013-11-01 MED ORDER — SODIUM CHLORIDE 0.9 % IV BOLUS (SEPSIS)
1000.0000 mL | Freq: Once | INTRAVENOUS | Status: AC
Start: 2013-11-01 — End: 2013-11-01
  Administered 2013-11-01: 1000 mL via INTRAVENOUS

## 2013-11-01 MED ORDER — PANTOPRAZOLE SODIUM 40 MG PO TBEC
40.0000 mg | DELAYED_RELEASE_TABLET | Freq: Every day | ORAL | Status: DC
  Administered 2013-11-02 – 2013-11-03 (×2): 40 mg via ORAL
  Filled 2013-11-01 (×2): qty 1

## 2013-11-01 MED ORDER — SODIUM CHLORIDE 0.9 % IJ SOLN
3.0000 mL | Freq: Two times a day (BID) | INTRAMUSCULAR | Status: DC
  Administered 2013-11-02 (×2): 3 mL via INTRAVENOUS

## 2013-11-01 MED ORDER — ACETAMINOPHEN 325 MG PO TABS: 650.0000 mg | ORAL_TABLET | Freq: Four times a day (QID) | ORAL | Status: DC | PRN

## 2013-11-01 MED ORDER — INSULIN ASPART 100 UNIT/ML ~~LOC~~ SOLN
0.0000 [IU] | Freq: Three times a day (TID) | SUBCUTANEOUS | Status: DC
  Administered 2013-11-02 (×2): 3 [IU] via SUBCUTANEOUS
  Administered 2013-11-03: 8 [IU] via SUBCUTANEOUS
  Administered 2013-11-03: 5 [IU] via SUBCUTANEOUS

## 2013-11-01 MED ORDER — SODIUM CHLORIDE 0.9 % IJ SOLN
3.0000 mL | Freq: Two times a day (BID) | INTRAMUSCULAR | Status: DC
  Administered 2013-11-02 – 2013-11-03 (×3): 3 mL via INTRAVENOUS

## 2013-11-01 MED ORDER — ACETAMINOPHEN 650 MG RE SUPP: 650.0000 mg | Freq: Four times a day (QID) | RECTAL | Status: DC | PRN

## 2013-11-01 MED ORDER — MORPHINE SULFATE 15 MG PO TABS
15.0000 mg | ORAL_TABLET | ORAL | Status: DC | PRN
  Administered 2013-11-02 – 2013-11-03 (×4): 15 mg via ORAL
  Filled 2013-11-01 (×4): qty 1

## 2013-11-01 MED ORDER — INSULIN ASPART 100 UNIT/ML ~~LOC~~ SOLN
20.0000 [IU] | Freq: Once | SUBCUTANEOUS | Status: AC
  Administered 2013-11-01: 20 [IU] via SUBCUTANEOUS

## 2013-11-01 MED ORDER — HEPARIN (PORCINE) IN NACL 100-0.45 UNIT/ML-% IJ SOLN
1700.0000 [IU]/h | INTRAMUSCULAR | Status: DC
  Administered 2013-11-01: 1700 [IU]/h via INTRAVENOUS
  Filled 2013-11-01 (×2): qty 250

## 2013-11-01 MED ORDER — DOCUSATE SODIUM 100 MG PO CAPS
100.0000 mg | ORAL_CAPSULE | Freq: Two times a day (BID) | ORAL | Status: DC
  Administered 2013-11-02 – 2013-11-03 (×4): 100 mg via ORAL
  Filled 2013-11-01 (×6): qty 1

## 2013-11-01 MED ORDER — SENNA 8.6 MG PO TABS
1.0000 | ORAL_TABLET | Freq: Two times a day (BID) | ORAL | Status: DC
  Administered 2013-11-02 – 2013-11-03 (×4): 8.6 mg via ORAL
  Filled 2013-11-01 (×4): qty 1

## 2013-11-01 MED ORDER — HEPARIN BOLUS VIA INFUSION
3000.0000 [IU] | Freq: Once | INTRAVENOUS | Status: AC
  Administered 2013-11-01: 3000 [IU] via INTRAVENOUS
  Filled 2013-11-01: qty 3000

## 2013-11-01 NOTE — ED Provider Notes (Signed)
CSN: [id number]     Arrival date & time 11/01/13  1734 History   First MD Initiated Contact with Patient 11/01/13 1755     Chief Complaint  Patient presents with  . Hyperglycemia     (Consider location/radiation/quality/duration/timing/severity/associated sxs/prior Treatment) HPI Patient presents to the emergency department with shortness of breath, weakness, and tachycardia.  The patient, states, that he was in the hospital for around one month.  Following a procedure to remove cancers lesions from his scrotal area.  Patient, states, that he also has masses in his lower abdomen.  Patient, states, that since he was discharged from hospital he has been very inactive and sedentary.  Patient denies chest pain, nausea, vomiting, abdominal pain, back pain, blurred vision, headache, fever, cough, wheezing, dizziness, numbness, or syncope.  Patient, states nothing seems make his condition, better, but exertion makes his shortness of breath, worse Past Medical History  Diagnosis Date  . Diabetes mellitus without complication   . Hypertension   . Mass of right inguinal region   . Squamous cell carcinoma   . Adrenal adenoma   . Sigmoid diverticulitis   . Skin cancer    Past Surgical History  Procedure Laterality Date  . Wart removal      Scrotal wart removal 10+ years ago  . Cystoscopy  07/18/2012    Procedure: CYSTOSCOPY;  Surgeon: Alexis Frock, MD;  Location: [facility];  Service: Urology;  Laterality: N/A;  . Mass excision  07/18/2012    Procedure: EXCISION MASS;  Surgeon: Alexis Frock, MD;  Location: [facility];  Service: Urology;  Laterality: Right;  . Laparotomy N/A 11/22/2012    Procedure: EXPLORATORY LAPAROTOMY LADD'S PROCEDURE;  Surgeon: Rolm Bookbinder, MD;  Location: [facility];  Service: General;  Laterality: N/A;  . Appendectomy N/A 11/22/2012    Procedure: APPENDECTOMY;  Surgeon: Rolm Bookbinder, MD;  Location: [facility];  Service: General;  Laterality: N/A;   Family History  Problem  Relation Age of Onset  . CVA Father   . Hypertension Father   . Cancer - Other Mother     Doesnot know what type  . Hypertension Mother   . Hypertension Sister   . Hypertension Brother   . Diabetes Mother   . Diabetes Father   . Diabetes Brother    History  Substance Use Topics  . Smoking status: Former Smoker -- 0.25 packs/day for 40 years  . Smokeless tobacco: Never Used  . Alcohol Use: No     Comment: Social    Review of Systems All other systems negative except as documented in the HPI. All pertinent positives and negatives as reviewed in the HPI.   Allergies  Review of patient's allergies indicates no known allergies.  Home Medications   Current Outpatient Rx  Name  Route  Sig  Dispense  Refill  . acetaminophen (TYLENOL) 325 MG tablet   Oral   Take 650 mg by mouth every 6 (six) hours as needed (PAIN).         Marland Kitchen docusate sodium (COLACE) 100 MG capsule   Oral   Take 100 mg by mouth 2 (two) times daily.         Marland Kitchen lisinopril (PRINIVIL,ZESTRIL) 10 MG tablet   Oral   Take 2 tablets (20 mg total) by mouth daily.   30 tablet   3     Reducing dose by 50% because of decline in renal f ...   . metFORMIN (GLUCOPHAGE) 1000 MG tablet   Oral  Take 0.5 tablets (500 mg total) by mouth 2 (two) times daily with a meal.   60 tablet   3     Reducing dose by 50% on 7/25 because of renal func ...   . morphine (MSIR) 15 MG tablet   Oral   Take 15 mg by mouth every 4 (four) hours as needed for pain.         Marland Kitchen oxyCODONE (OXY IR/ROXICODONE) 5 MG immediate release tablet   Oral   Take 5-10 mg by mouth every 4 (four) hours as needed for pain.         . pantoprazole (PROTONIX) 40 MG tablet   Oral   Take 1 tablet (40 mg total) by mouth daily.   30 tablet   3   . polyethylene glycol (MIRALAX / GLYCOLAX) packet   Oral   Take 17 g by mouth daily.         Marland Kitchen senna (SENOKOT) 8.6 MG TABS   Oral   Take 1 tablet by mouth 2 (two) times daily.          BP 140/85   Pulse 123  Temp(Src) 97.6 F (36.4 C) (Oral)  Resp 19  Wt 240 lb (108.863 kg)  SpO2 95% Physical Exam  Nursing note and vitals reviewed. Constitutional: He is oriented to person, place, and time. He appears well-developed and well-nourished. No distress.  HENT:  Head: Normocephalic and atraumatic.  Mouth/Throat: Oropharynx is clear and moist.  Eyes: Pupils are equal, round, and reactive to light.  Neck: Normal range of motion. Neck supple.  Cardiovascular: Normal rate, regular rhythm and normal heart sounds.  Exam reveals no gallop and no friction rub.   No murmur heard. Pulmonary/Chest: Effort normal and breath sounds normal. No respiratory distress. He has no wheezes. He has no rales.  Musculoskeletal:  Patient has bilateral lower extremity edema.  He does have some pain on palpation of the left lower extremity  Neurological: He is alert and oriented to person, place, and time. He exhibits normal muscle tone. Coordination normal.  Skin: Skin is warm and dry.    ED Course  Procedures (including critical care time) Labs Review Labs Reviewed  CBC WITH DIFFERENTIAL - Abnormal; Notable for the following:    RBC 3.40 (*)    Hemoglobin 9.5 (*)    HCT 29.4 (*)    Neutrophils Relative % 80 (*)    All other components within normal limits  COMPREHENSIVE METABOLIC PANEL - Abnormal; Notable for the following:    Glucose, Bld 330 (*)    Total Protein 8.4 (*)    Albumin 3.1 (*)    Total Bilirubin <0.2 (*)    GFR calc non Af Amer 56 (*)    GFR calc Af Amer 65 (*)    All other components within normal limits  URINALYSIS, ROUTINE W REFLEX MICROSCOPIC - Abnormal; Notable for the following:    Specific Gravity, Urine 1.041 (*)    Glucose, UA >1000 (*)    Hgb urine dipstick TRACE (*)    All other components within normal limits  URINE MICROSCOPIC-ADD ON  APTT  PROTIME-INR  HEPARIN LEVEL (UNFRACTIONATED)  CBC   Imaging Review Dg Chest 2 View  11/01/2013   ADDENDUM REPORT:  11/01/2013 21:15  ADDENDUM: The above impression should read :1. Mild left base pleural parenchymal scarring.   Electronically Signed   By: Marcello Moores  Register   On: 11/01/2013 21:15   11/01/2013   CLINICAL DATA:  Shortness of  breath.  EXAM: CHEST  2 VIEW  COMPARISON:  None.  FINDINGS: Mediastinum and hilar structures normal. Cardiomegaly. Pulmonary vascularity is normal. No pleural effusion or pneumothorax. Mild left base atelectasis pleural-parenchymal scarring. No acute bony abnormality .  IMPRESSION: Femoral Mild left base pleural parenchymal scarring.  2.  Cardiomegaly.  No CHF.  Electronically Signed: ByMarcello Moores  Register On: 11/01/2013 19:32   Ct Angio Chest Pe W/cm &/or Wo Cm  11/01/2013   CLINICAL DATA:  Shortness of breath.  EXAM: CT ANGIOGRAPHY CHEST WITH CONTRAST  TECHNIQUE: Multidetector CT imaging of the chest was performed using the standard protocol during bolus administration of intravenous contrast. Multiplanar CT image reconstructions and MIPs were obtained to evaluate the vascular anatomy.  CONTRAST:  141mL OMNIPAQUE IOHEXOL 350 MG/ML SOLN  COMPARISON:  DG CHEST 2 VIEW dated 11/01/2013.  FINDINGS: Thoracic aorta normal caliber. No evidence of dissection or aneurysm. Massive bilateral main pulmonary artery emboli are present. These extend into segmental branches. Right ventricular/ left ventricular ratio normal. Cardiomegaly. Coronary artery disease. No pericardial effusion. Shotty mediastinal lymph nodes noted. Thoracic esophagus is unremarkable.  Large airways are patent. Mild basilar atelectasis. No pleural effusion pneumothorax.  Visualized upper abdominal viscera unremarkable.  Thyroid is unremarkable. Submental small lymph nodes are present. Chest wall is intact. No acute bony abnormality.  Review of the MIP images confirms the above findings.  IMPRESSION: 1. Massive bilateral pulmonary emboli. Right ventricular /left ventricular ratio normal. Critical Value/emergent results were called by  telephone at the time of interpretation on 11/01/2013 at 9:10 PM to Dr. Dalia Heading , who verbally acknowledged these results.  2.  Coronary artery disease.   Electronically Signed   By: Marcello Moores  Register   On: 11/01/2013 21:13    Patient will be admitted to the hospital for a large bilateral pulmonary embolus.  Patient started on heparin.  Patient is given a plan and all questions were answered.  I spoke with the, [facility], who will be down to see the patient.    Brent General, PA-C 11/01/13 2333

## 2013-11-01 NOTE — Addendum Note (Signed)
Addended by: Candie Chroman D on: 11/01/2013 05:38 PM   Modules accepted: Orders

## 2013-11-01 NOTE — Progress Notes (Signed)
patient is in clinic to day for a follow up of tachycardia and SOB

## 2013-11-01 NOTE — Patient Instructions (Signed)
Pt transferred to [facility] via EMS stable Report given to charge nurse 20 units insulin given

## 2013-11-01 NOTE — H&P (Signed)
[facility] History and Physical  [name] KGM:[id number] DOB: Jan 14, 1956 DOA: 11/01/2013  Referring physician:  PCP: Angelica Chessman, MD  Specialists:   Chief Complaint: Shortness of breath  HPI: Theodore Flores is a 58 y.o. male  History of squamous cell carcinoma of the scrotum, diabetes mellitus, hypertension that presents emergency department for shortness of breath. Patient has had progressive shortness of breath as well as palpitations for approximately one week. Has been progressive in nature. He was hospitalized for one month at [facility] for this scrotal cancer. Patient did have removal of the cancer, and did have wound VAC at one point, is currently receiving home health for his wound. At this time patient denies any contacts or recent illness or recent travel. He does admit to being more immobile for the last month.  Review of Systems:  Constitutional: Denies fever, chills, diaphoresis, appetite change and fatigue.  HEENT: Denies photophobia, eye pain, redness, hearing loss, ear pain, congestion, sore throat, rhinorrhea, sneezing, mouth sores, trouble swallowing, neck pain, neck stiffness and tinnitus.   Respiratory: Complains of increasing shortness of breath.   Cardiovascular: Denies chest pain, leg swelling.  Complains of his heart racing.  Gastrointestinal: Denies nausea, vomiting, abdominal pain, diarrhea, constipation, blood in stool and abdominal distention.  Genitourinary: Denies dysuria, urgency, frequency, hematuria, flank pain and difficulty urinating.  Complains of scrotal cancer and wound. Musculoskeletal: Denies myalgias, back pain, joint swelling, arthralgias and gait problem.  Skin: Denies pallor, rash and wound.  Neurological: Denies dizziness, seizures, syncope, weakness, light-headedness, numbness and headaches.  Hematological: Denies adenopathy. Easy bruising, personal or family bleeding history  Psychiatric/Behavioral: Denies suicidal  ideation, mood changes, confusion, nervousness, sleep disturbance and agitation  Past Medical History  Diagnosis Date  . Diabetes mellitus without complication   . Hypertension   . Mass of right inguinal region   . Squamous cell carcinoma   . Adrenal adenoma   . Sigmoid diverticulitis   . Skin cancer    Past Surgical History  Procedure Laterality Date  . Wart removal      Scrotal wart removal 10+ years ago  . Cystoscopy  07/18/2012    Procedure: CYSTOSCOPY;  Surgeon: Alexis Frock, MD;  Location: [facility];  Service: Urology;  Laterality: N/A;  . Mass excision  07/18/2012    Procedure: EXCISION MASS;  Surgeon: Alexis Frock, MD;  Location: [facility];  Service: Urology;  Laterality: Right;  . Laparotomy N/A 11/22/2012    Procedure: EXPLORATORY LAPAROTOMY LADD'S PROCEDURE;  Surgeon: Rolm Bookbinder, MD;  Location: [place];  Service: General;  Laterality: N/A;  . Appendectomy N/A 11/22/2012    Procedure: APPENDECTOMY;  Surgeon: Rolm Bookbinder, MD;  Location: [place];  Service: General;  Laterality: N/A;   Social History:  reports that he has quit smoking. He has never used smokeless tobacco. He reports that he does not drink alcohol or use illicit drugs. Patient lives at home with his wife.  No Known Allergies  Family History  Problem Relation Age of Onset  . CVA Father   . Hypertension Father   . Cancer - Other Mother     Doesnot know what type  . Hypertension Mother   . Hypertension Sister   . Hypertension Brother   . Diabetes Mother   . Diabetes Father   . Diabetes Brother     Prior to Admission medications   Medication Sig Start Date End Date Taking? Authorizing Provider  acetaminophen (TYLENOL) 325 MG tablet Take 650 mg by mouth  every 6 (six) hours as needed (PAIN).   Yes Historical Provider, MD  docusate sodium (COLACE) 100 MG capsule Take 100 mg by mouth 2 (two) times daily.   Yes Historical Provider, MD  lisinopril (PRINIVIL,ZESTRIL) 10 MG tablet Take 2 tablets (20 mg  total) by mouth daily. 03/11/13  Yes Clanford Marisa Hua, MD  metFORMIN (GLUCOPHAGE) 1000 MG tablet Take 0.5 tablets (500 mg total) by mouth 2 (two) times daily with a meal. 03/11/13  Yes Clanford L Johnson, MD  morphine (MSIR) 15 MG tablet Take 15 mg by mouth every 4 (four) hours as needed for pain.   Yes Historical Provider, MD  oxyCODONE (OXY IR/ROXICODONE) 5 MG immediate release tablet Take 5-10 mg by mouth every 4 (four) hours as needed for pain.   Yes Historical Provider, MD  pantoprazole (PROTONIX) 40 MG tablet Take 1 tablet (40 mg total) by mouth daily. 03/10/13  Yes Clanford Marisa Hua, MD  polyethylene glycol (MIRALAX / GLYCOLAX) packet Take 17 g by mouth daily.   Yes Historical Provider, MD  senna (SENOKOT) 8.6 MG TABS Take 1 tablet by mouth 2 (two) times daily.   Yes Historical Provider, MD   Physical Exam: Filed Vitals:   11/01/13 2050  BP: 140/85  Pulse: 123  Temp: 97.6 F (36.4 C)  Resp: 19     General: Well developed, well nourished, NAD, appears stated age  HEENT: NCAT, PERRLA, EOMI, Anicteic Sclera, mucous membranes moist.  Neck: Supple, no JVD, no masses,  Cardiovascular: S1 S2 auscultated, tachycardic.  Respiratory: Clear to auscultation bilaterally with equal chest rise  Abdomen: Soft, obese, nontender, nondistended, + bowel sounds, well healed scar  GU: Bandage on scrotum.   Extremities: warm dry without cyanosis clubbing or edema  Neuro: AAOx3, cranial nerves grossly intact. Strength 5/5 in patient's upper and lower extremities bilaterally  Skin: Without rashes exudates or nodules  Psych: Normal affect and demeanor with intact judgement and insight  Labs on Admission:  Basic Metabolic Panel:  Recent Labs Lab 11/01/13 1826  NA 137  K 4.9  CL 99  CO2 22  GLUCOSE 330*  BUN 20  CREATININE 1.34  CALCIUM 10.0   Liver Function Tests:  Recent Labs Lab 11/01/13 1826  AST 21  ALT 16  ALKPHOS 92  BILITOT <0.2*  PROT 8.4*  ALBUMIN 3.1*   No  results found for this basename: LIPASE, AMYLASE,  in the last 168 hours No results found for this basename: AMMONIA,  in the last 168 hours CBC:  Recent Labs Lab 11/01/13 1826  WBC 7.0  NEUTROABS 5.6  HGB 9.5*  HCT 29.4*  MCV 86.5  PLT 268   Cardiac Enzymes: No results found for this basename: CKTOTAL, CKMB, CKMBINDEX, TROPONINI,  in the last 168 hours  BNP (last 3 results) No results found for this basename: PROBNP,  in the last 8760 hours CBG: No results found for this basename: GLUCAP,  in the last 168 hours  Radiological Exams on Admission: Dg Chest 2 View  11/01/2013   ADDENDUM REPORT: 11/01/2013 21:15  ADDENDUM: The above impression should read :1. Mild left base pleural parenchymal scarring.   Electronically Signed   By: Marcello Moores  Register   On: 11/01/2013 21:15   11/01/2013   CLINICAL DATA:  Shortness of breath.  EXAM: CHEST  2 VIEW  COMPARISON:  None.  FINDINGS: Mediastinum and hilar structures normal. Cardiomegaly. Pulmonary vascularity is normal. No pleural effusion or pneumothorax. Mild left base atelectasis pleural-parenchymal scarring. No acute bony  abnormality .  IMPRESSION: Femoral Mild left base pleural parenchymal scarring.  2.  Cardiomegaly.  No CHF.  Electronically Signed: ByMarcello Moores  Register On: 11/01/2013 19:32   Ct Angio Chest Pe W/cm &/or Wo Cm  11/01/2013   CLINICAL DATA:  Shortness of breath.  EXAM: CT ANGIOGRAPHY CHEST WITH CONTRAST  TECHNIQUE: Multidetector CT imaging of the chest was performed using the standard protocol during bolus administration of intravenous contrast. Multiplanar CT image reconstructions and MIPs were obtained to evaluate the vascular anatomy.  CONTRAST:  149mL OMNIPAQUE IOHEXOL 350 MG/ML SOLN  COMPARISON:  DG CHEST 2 VIEW dated 11/01/2013.  FINDINGS: Thoracic aorta normal caliber. No evidence of dissection or aneurysm. Massive bilateral main pulmonary artery emboli are present. These extend into segmental branches. Right ventricular/ left  ventricular ratio normal. Cardiomegaly. Coronary artery disease. No pericardial effusion. Shotty mediastinal lymph nodes noted. Thoracic esophagus is unremarkable.  Large airways are patent. Mild basilar atelectasis. No pleural effusion pneumothorax.  Visualized upper abdominal viscera unremarkable.  Thyroid is unremarkable. Submental small lymph nodes are present. Chest wall is intact. No acute bony abnormality.  Review of the MIP images confirms the above findings.  IMPRESSION: 1. Massive bilateral pulmonary emboli. Right ventricular /left ventricular ratio normal. Critical Value/emergent results were called by telephone at the time of interpretation on 11/01/2013 at 9:10 PM to Dr. Dalia Heading , who verbally acknowledged these results.  2.  Coronary artery disease.   Electronically Signed   By: Marcello Moores  Register   On: 11/01/2013 21:13    EKG: Independently reviewed. Sinus tachycardia rate 119  Assessment/Plan  Dyspnea secondary to bilateral pulmonary embolism Patient be admitted to telemetry floor. Patient was placed on heparin drip, however, will transition to full dose lovenox due to possible relation to malignancy. CT shows massive bilateral pulmonary emboli, right ventricle/left ventricular ratio normal. He does have risk factors for a blood clot including recent diagnosis of scrotal squamous cell cancer as well as immobility in the last few weeks. Patient will need to be transitioned to oral anticoagulants versus continued on lovenox. At this time vital signs are stable, patient is slightly tachycardic. Will obtain lower extremity doppler.    Squamous cell carcinoma of the scrotum Patient currently has a wound secondary to a removal of tumor. Patient was recently hospitalized at [facility] approximately for one month. Will ask wound care to evaluate and treat patient. Will also continue his pain regimen, oxycodone and morphine when necessary.  Normocytic Anemia Hemoglobin currently 9.5. Appears  that his baseline is approximately 10. We'll continue to monitor CBC.  Diabetes mellitus Will hold his metformin. Patient be placed on insulin sliding scale with CBG monitoring. Will also obtain hemoglobin A1c.  Hypertension Continue lisinopril.  DVT prophylaxis: Heparin drip  Code Status: Full  Condition: Guarded  Family Communication: Wife at bedside. Admission, patients condition and plan of care including tests being ordered have been discussed with the patient and wife who indicate understanding and agree with the plan and Code Status.  Disposition Plan: Admitted.   Time spent: 45 minutes  Lonza Shimabukuro D.O. [facility] Pager [phone]  If 7PM-7AM, please contact night-coverage [email or web address] Password [facility] 11/01/2013, 10:53 PM

## 2013-11-01 NOTE — ED Provider Notes (Signed)
Medical screening examination/treatment/procedure(s) were performed by non-physician practitioner and as supervising physician I was immediately available for consultation/collaboration.   EKG Interpretation None       Threasa Beards, MD 11/01/13 2334

## 2013-11-01 NOTE — Addendum Note (Signed)
Addended by: Allyson Sabal MD, Ascencion Dike on: 11/01/2013 04:34 PM   Modules accepted: Orders

## 2013-11-01 NOTE — Progress Notes (Signed)
ANTICOAGULATION CONSULT NOTE - Initial Consult  Pharmacy Consult for heparin Indication: pulmonary embolus  No Known Allergies  Patient Measurements: Weight: 240 lb (108.863 kg) Heparin Dosing Weight: 105  Vital Signs: Temp: 97.6 F (36.4 C) (03/17 2050) Temp src: Oral (03/17 2050) BP: 140/85 mmHg (03/17 2050) Pulse Rate: 123 (03/17 2050)  Labs:  Recent Labs  11/01/13 1826  HGB 9.5*  HCT 29.4*  PLT 268  CREATININE 1.34    The CrCl is unknown because both a height and weight (above a minimum accepted value) are required for this calculation.   Medical History: Past Medical History  Diagnosis Date  . Diabetes mellitus without complication   . Hypertension   . Mass of right inguinal region   . Squamous cell carcinoma   . Adrenal adenoma   . Sigmoid diverticulitis   . Skin cancer     Medications:   (Not in a hospital admission) Scheduled:  Infusions:   Assessment: 57yo M with weakness, hyperglycemia, tachycardia, and SOB. CT revealed massive bilateral PE. Wife reports recent admission at [facility] for surgery for squamous cell cancer of the scrotum. Pharmacy is asked to dose heparin.  On no anticoagulants PTA.  H/H is low. Platelets are ok.  No bleeding reported/documented.  Goal of Therapy:  Heparin level 0.3-0.7 units/ml Monitor platelets by anticoagulation protocol: Yes   Plan:   Draw baseline aPTT and PT/INR.  Give heparin 3000units IV bolus then start infusion at 1700units/hr.  Check heparin level in 6hrs and daily therafter.  Check CBC daily while on heparin.  F/u plan for oral anticoagulation.  Romeo Rabon, PharmD, pager [phone]. 11/01/2013,9:59 PM.

## 2013-11-01 NOTE — ED Notes (Addendum)
Patient was being evaluated for increased weakness over the last week at [place] today.  Blood sugar was 454.  They administered an unknown quantity of Novolog.  EMS rechecked CBG in route and it was 464.  Patient also became SOB while in clinic upon standing.  Heart rate elevated at 130.  Denies chest pain.  Patient had recent surgery to left leg for skin cancer.

## 2013-11-01 NOTE — ED Notes (Signed)
Bed: RESB Expected date:  Expected time:  Means of arrival:  Comments: EMS Hyperglycemia 

## 2013-11-01 NOTE — ED Provider Notes (Signed)
Medical screening examination/treatment/procedure(s) were performed by non-physician practitioner and as supervising physician I was immediately available for consultation/collaboration.   EKG Interpretation None      Date: 11/01/2013  Rate: 119  Rhythm: sinus tachycardia  QRS Axis: normal  Intervals: normal  ST/T Wave abnormalities: nonspecific ST/T changes  Conduction Disutrbances:none  Narrative Interpretation:   Old EKG Reviewed: none available    Threasa Beards, MD 11/01/13 2307

## 2013-11-01 NOTE — Progress Notes (Signed)
   CARE MANAGEMENT ED NOTE 11/01/2013  Patient:  Theodore Flores, Theodore Flores   Account Number:  [id number]  Date Initiated:  11/01/2013  Documentation initiated by:  Livia Snellen  Subjective/Objective Assessment:   Patient presents to Ed with weakness for one week and elevated blood sugar.     Subjective/Objective Assessment Detail:   Patient with pmhx of HTN and DM.     Action/Plan:   Action/Plan Detail:   Anticipated DC Date:       Status Recommendation to Physician:   Result of Recommendation:    Other ED [place]  Other  PCP issues    Choice offered to / List presented to:            Status of service:  Completed, signed off  ED Comments:   ED Comments Detail:  EDCM spoke to patient and male friend or family member at bedside.  Patient's friend/family member reports patient was seen at [facility] and [facility].  Patient confirms his pcp is Dr. Doreene Burke.  No further EDCM needs at this time.

## 2013-11-01 NOTE — Progress Notes (Addendum)
Patient ID: Theodore Flores, male   DOB: Oct 05, 1955, 58 y.o.   MRN: [id number]   CC:  HPI: 58 year old male with a history of hypertension, diabetes, last A1c of 8.2, here for a followup. 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 This is according to the patient's wife. She does not have any paperwork from [facility]. She claims that after the surgery patient was discharged on 10/06/13. The patient has been tachycardic lightheaded, has had an unsteady gait. He has also been short of breath. They have a blood pressure monitor at home and his heart rate has been ranging from 120-140. The patient developed similar symptoms in the hospital. However I do not have any records from the hospital to review. Today the patient's heart rate is in the 140s. EKG confirms SVT. Patient is also very diaphoretic and weak. His scrotal area looks clean and the patient has been changing his dressing. No reported fever. According to the patient's wife his sugar was unmeasurable via glucometer  No Known Allergies Past Medical History  Diagnosis Date  . Diabetes mellitus without complication   . Hypertension   . Mass of right inguinal region   . Squamous cell carcinoma   . Adrenal adenoma   . Sigmoid diverticulitis   . Skin cancer    Current Outpatient Prescriptions on File Prior to Visit  Medication Sig Dispense Refill  . docusate sodium (COLACE) 100 MG capsule Take 100 mg by mouth 2 (two) times daily.      Marland Kitchen lidocaine (LMX) 4 % cream Apply topically as needed.      Marland Kitchen lisinopril (PRINIVIL,ZESTRIL) 10 MG tablet Take 2 tablets (20 mg total) by mouth daily.  30 tablet  3  . magnesium oxide (MAG-OX) 400 MG tablet Take 400 mg by mouth daily.      . metFORMIN (GLUCOPHAGE) 1000 MG tablet Take 0.5 tablets (500 mg total) by mouth 2 (two) times daily with a meal.  60 tablet  3  . metoprolol tartrate (LOPRESSOR) 12.5 mg TABS Take 0.5  tablets (12.5 mg total) by mouth 2 (two) times daily.  30 tablet  0  . morphine (MSIR) 15 MG tablet Take 15 mg by mouth every 4 (four) hours as needed for pain.      Marland Kitchen nystatin (MYCOSTATIN) powder Apply 1 g topically 4 (four) times daily.      . ondansetron (ZOFRAN) 4 MG tablet Take 4 mg by mouth every 8 (eight) hours as needed for nausea.      Marland Kitchen oxyCODONE (OXY IR/ROXICODONE) 5 MG immediate release tablet Take 5-10 mg by mouth every 4 (four) hours as needed for pain.      . pantoprazole (PROTONIX) 40 MG tablet Take 1 tablet (40 mg total) by mouth daily.  30 tablet  3  . promethazine (PHENERGAN) 25 MG tablet Take 25 mg by mouth every 6 (six) hours as needed for nausea.      Marland Kitchen senna (SENOKOT) 8.6 MG TABS Take 1 tablet by mouth 2 (two) times daily.       No current facility-administered medications on file prior to visit.   Family History  Problem Relation Age of Onset  . CVA Father   . Hypertension Father   . Cancer - Other Mother     Doesnot know what type  . Hypertension Mother   . Hypertension Sister   . Hypertension Brother   . Diabetes Mother   .  Diabetes Father   . Diabetes Brother    History   Social History  . Marital Status: Married    Spouse Name: N/A    Number of Children: 3  . Years of Education: N/A   Occupational History  . 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 Smoking status: Former Smoker -- 0.25 packs/day for 40 years  . Smokeless tobacco: Never Used  . Alcohol Use: No     Comment: Social  . Drug Use: No  . Sexual Activity: Not Currently    Partners: Female   Other Topics Concern  . Not on file   Social History Narrative   Lives with wife   2 daughters and 1 son   Truck driver    Review of Systems  Constitutional: Negative for fever, chills, diaphoresis, activity change, appetite change and fatigue.  HENT: Negative for ear pain, nosebleeds, congestion, facial swelling, rhinorrhea, neck pain, neck stiffness  and ear discharge.   Eyes: Negative for pain, discharge, redness, itching and visual disturbance.  Respiratory: As in history of present illness Cardiovascular: As in history of present illness.  Gastrointestinal: Negative for abdominal distention.  Genitourinary: Negative for dysuria, urgency, frequency, hematuria, flank pain, decreased urine volume, difficulty urinating and dyspareunia.  Musculoskeletal: Negative for back pain, joint swelling, arthralgias and gait problem.  Neurological: Negative for dizziness, tremors, seizures, syncope, facial asymmetry, speech difficulty, weakness, light-headedness, numbness and headaches.  Hematological: Negative for adenopathy. Does not bruise/bleed easily.  Psychiatric/Behavioral: Negative for hallucinations, behavioral problems, confusion, dysphoric mood, decreased concentration and agitation.    Objective:   Filed Vitals:   11/01/13 1549  BP: 128/77  Pulse: 102  Temp: 97.6 F (36.4 C)    Physical Exam  Constitutional: Appears well-developed and well-nourished. No distress.  HENT: Normocephalic. External right and left ear normal. Oropharynx is clear and moist.  Eyes: Conjunctivae and EOM are normal. PERRLA, no scleral icterus.  Neck: Normal ROM. Neck supple. No JVD. No tracheal deviation. No thyromegaly.  CVS: RRR, S1/S2 +, no murmurs, no gallops, no carotid bruit.  Pulmonary: Effort and breath sounds normal, no stridor, rhonchi, wheezes, rales.  Abdominal: Soft. BS +,  no distension, tenderness, rebound or guarding.  Musculoskeletal: Normal range of motion. No edema and no tenderness.  Lymphadenopathy: No lymphadenopathy noted, cervical, inguinal. Neuro: Alert. Normal reflexes, muscle tone coordination. No cranial nerve deficit. Skin: Skin is warm and dry. No rash noted. Not diaphoretic. No erythema. No pallor.  Psychiatric: Normal mood and affect. Behavior, judgment, thought content normal.   Lab Results  Component Value Date   WBC  3.9* 03/10/2013   HGB 10.0* 03/10/2013   HCT 30.2* 03/10/2013   MCV 90.1 03/10/2013   PLT 342 03/10/2013   Lab Results  Component Value Date   CREATININE 1.47* 03/10/2013   BUN 14 03/10/2013   NA 141 03/10/2013   K 4.1 03/10/2013   CL 107 03/10/2013   CO2 26 03/10/2013    Lab Results  Component Value Date   HGBA1C 8.2* 11/22/2012   Lipid Panel     Component Value Date/Time   CHOL 162 03/10/2013 1809   TRIG 129 03/10/2013 1809   HDL 34* 03/10/2013 1809   CHOLHDL 4.8 03/10/2013 1809   VLDL 26 03/10/2013 1809   LDLCALC 102* 03/10/2013 1809       Assessment and plan:   Patient Active Problem List   Diagnosis Date Noted  . 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  Malignant neoplasm of scrotum 09/01/2012  . Hypertension 07/16/2012  . Diabetes mellitus type 2 in obese 07/16/2012       Shortness of breath/tachycardia Patient hemodynamically stable but in SVT/sinus tach Patient is being referred to the ED for shortness of breath, tachycardia, elevated CBG. CBG today was greater than 450. Because of his recent surgery on concerned about pulmonary embolism. Postoperative pneumonia, acute coronary syndrome is also a concern. Patient would also have infection of the surgical site, given elevated CBGs and needs stat blood work For the above reasons the patient is being referred to the ED   The patient was given clear instructions to go to ER or return to medical center if symptoms don't improve, worsen or new problems develop. The patient verbalized understanding. The patient was told to call to get any lab results if not heard anything in the next week.

## 2013-11-01 NOTE — Progress Notes (Deleted)
Patient ID: Theodore Flores, male   DOB: 1955/11/06, 58 y.o.   MRN: [id number]   CC:  HPI: 58 year old male with a history of diabetes or hypertension presents today with multiple complaints. The patient has been feeling dizzy and lightheaded for the last week. He almost fell 3 days ago and trying to get off the commode. The patient has not been taking his diabetes medications because he cannot afford them. Has only been taking insulin and metformin. He has not been checking his sugar because he cannot afford the glucometer and lancets. [facility]. He does complain of dizziness and occasional headache and being unsteady on his feet. He denies any chest pain any shortness of breath  A1c in the clinic was greater than 14.0. Initial CBG was unmeasurable repeat CBG was greater than 500., Blood pressure is low 102. Patient is tachycardic.   No Known Allergies Past Medical History  Diagnosis Date  . Diabetes mellitus without complication   . Hypertension   . Mass of right inguinal region   . Squamous cell carcinoma   . Adrenal adenoma   . Sigmoid diverticulitis   . Skin cancer    Current Outpatient Prescriptions on File Prior to Visit  Medication Sig Dispense Refill  . docusate sodium (COLACE) 100 MG capsule Take 100 mg by mouth 2 (two) times daily.      Marland Kitchen lidocaine (LMX) 4 % cream Apply topically as needed.      Marland Kitchen lisinopril (PRINIVIL,ZESTRIL) 10 MG tablet Take 2 tablets (20 mg total) by mouth daily.  30 tablet  3  . magnesium oxide (MAG-OX) 400 MG tablet Take 400 mg by mouth daily.      . metFORMIN (GLUCOPHAGE) 1000 MG tablet Take 0.5 tablets (500 mg total) by mouth 2 (two) times daily with a meal.  60 tablet  3  . metoprolol tartrate (LOPRESSOR) 12.5 mg TABS Take 0.5 tablets (12.5 mg total) by mouth 2 (two) times daily.  30 tablet  0  . morphine (MSIR) 15 MG tablet Take 15 mg by mouth every 4 (four) hours as needed for pain.      Marland Kitchen nystatin (MYCOSTATIN)  powder Apply 1 g topically 4 (four) times daily.      . ondansetron (ZOFRAN) 4 MG tablet Take 4 mg by mouth every 8 (eight) hours as needed for nausea.      Marland Kitchen oxyCODONE (OXY IR/ROXICODONE) 5 MG immediate release tablet Take 5-10 mg by mouth every 4 (four) hours as needed for pain.      . pantoprazole (PROTONIX) 40 MG tablet Take 1 tablet (40 mg total) by mouth daily.  30 tablet  3  . promethazine (PHENERGAN) 25 MG tablet Take 25 mg by mouth every 6 (six) hours as needed for nausea.      Marland Kitchen senna (SENOKOT) 8.6 MG TABS Take 1 tablet by mouth 2 (two) times daily.       No current facility-administered medications on file prior to visit.   Family History  Problem Relation Age of Onset  . CVA Father   . Hypertension Father   . Cancer - Other Mother     Doesnot know what type  . Hypertension Mother   . Hypertension Sister   . Hypertension Brother   . Diabetes Mother   . Diabetes Father   . Diabetes Brother    History   Social History  . Marital Status: Married    Spouse Name: N/A  Number of Children: 3  . Years of Education: N/A   Occupational History  . 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 Smoking status: Former Smoker -- 0.25 packs/day for 40 years  . Smokeless tobacco: Never Used  . Alcohol Use: No     Comment: Social  . Drug Use: No  . Sexual Activity: Not Currently    Partners: Female   Other Topics Concern  . Not on file   Social History Narrative   Lives with wife   2 daughters and 1 son   Truck driver    Review of Systems  Constitutional: Negative for fever, chills, diaphoresis, activity change, appetite change and fatigue.  HENT: Negative for ear pain, nosebleeds, congestion, facial swelling, rhinorrhea, neck pain, neck stiffness and ear discharge.   Eyes: Negative for pain, discharge, redness, itching and visual disturbance.  Respiratory: Negative for cough, choking, chest tightness, shortness of breath, wheezing and  stridor.   Cardiovascular: Negative for chest pain, palpitations and leg swelling.  Gastrointestinal: Negative for abdominal distention.  Genitourinary: Negative for dysuria, urgency, frequency, hematuria, flank pain, decreased urine volume, difficulty urinating and dyspareunia.  Musculoskeletal: Negative for back pain, joint swelling, arthralgias and gait problem.  Neurological: Negative for dizziness, tremors, seizures, syncope, facial asymmetry, speech difficulty, weakness, light-headedness, numbness and headaches.  Hematological: Negative for adenopathy. Does not bruise/bleed easily.  Psychiatric/Behavioral: Negative for hallucinations, behavioral problems, confusion, dysphoric mood, decreased concentration and agitation.    Objective:   Filed Vitals:   11/01/13 1549  BP: 128/77  Pulse: 102  Temp: 97.6 F (36.4 C)    Physical Exam  Constitutional: Appears well-developed and well-nourished. No distress.  HENT: Normocephalic. External right and left ear normal. Oropharynx is clear and moist.  Eyes: Conjunctivae and EOM are normal. PERRLA, no scleral icterus.  Neck: Normal ROM. Neck supple. No JVD. No tracheal deviation. No thyromegaly.  CVS: RRR, S1/S2 +, no murmurs, no gallops, no carotid bruit.  Pulmonary: Effort and breath sounds normal, no stridor, rhonchi, wheezes, rales.  Abdominal: Soft. BS +,  no distension, tenderness, rebound or guarding.  Musculoskeletal: Normal range of motion. No edema and no tenderness.  Lymphadenopathy: No lymphadenopathy noted, cervical, inguinal. Neuro: Alert. Normal reflexes, muscle tone coordination. No cranial nerve deficit. Skin: Skin is warm and dry. No rash noted. Not diaphoretic. No erythema. No pallor.  Psychiatric: Normal mood and affect. Behavior, judgment, thought content normal.   Lab Results  Component Value Date   WBC 3.9* 03/10/2013   HGB 10.0* 03/10/2013   HCT 30.2* 03/10/2013   MCV 90.1 03/10/2013   PLT 342 03/10/2013   Lab  Results  Component Value Date   CREATININE 1.47* 03/10/2013   BUN 14 03/10/2013   NA 141 03/10/2013   K 4.1 03/10/2013   CL 107 03/10/2013   CO2 26 03/10/2013    Lab Results  Component Value Date   HGBA1C 8.2* 11/22/2012   Lipid Panel     Component Value Date/Time   CHOL 162 03/10/2013 1809   TRIG 129 03/10/2013 1809   HDL 34* 03/10/2013 1809   CHOLHDL 4.8 03/10/2013 1809   VLDL 26 03/10/2013 1809   LDLCALC 102* 03/10/2013 1809       Assessment and plan:   Patient Active Problem List   Diagnosis Date Noted  . 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 Malignant neoplasm of scrotum 09/01/2012  . Hypertension 07/16/2012  . Diabetes mellitus type 2 in obese  07/16/2012   Hyperglycemia All blood work pending, unsure if patient is in DKA or has hyperosmolar hyperglycemic state We have given him 10 units of NovoLog subcutaneous The patient needs to be evaluated in the ER for possible DKA Needs IV fluids, given symptoms of lightheadedness He needs a troponin, d-dimer, EKG to evaluate his symptoms of dizziness  Patient is being transferred to the ED      The patient was given clear instructions to go to ER or return to medical center if symptoms don't improve, worsen or new problems develop. The patient verbalized understanding. The patient was told to call to get any lab results if not heard anything in the next week.

## 2013-11-02 LAB — CBC
HEMATOCRIT: 26.9 % — AB (ref 39.0–52.0)
Hemoglobin: 8.6 g/dL — ABNORMAL LOW (ref 13.0–17.0)
MCH: 28 pg (ref 26.0–34.0)
MCHC: 32 g/dL (ref 30.0–36.0)
MCV: 87.6 fL (ref 78.0–100.0)
Platelets: 240 10*3/uL (ref 150–400)
RBC: 3.07 MIL/uL — AB (ref 4.22–5.81)
RDW: 13.9 % (ref 11.5–15.5)
WBC: 6.8 10*3/uL (ref 4.0–10.5)

## 2013-11-02 LAB — BASIC METABOLIC PANEL
BUN: 13 mg/dL (ref 6–23)
CHLORIDE: 103 meq/L (ref 96–112)
CO2: 23 meq/L (ref 19–32)
CREATININE: 1.08 mg/dL (ref 0.50–1.35)
Calcium: 9.5 mg/dL (ref 8.4–10.5)
GFR calc non Af Amer: 73 mL/min — ABNORMAL LOW (ref >90)
GFR, EST AFRICAN AMERICAN: 85 mL/min — AB (ref >90)
Glucose, Bld: 215 mg/dL — ABNORMAL HIGH (ref 70–99)
POTASSIUM: 4.1 meq/L (ref 3.7–5.3)
SODIUM: 138 meq/L (ref 137–147)

## 2013-11-02 LAB — GLUCOSE, CAPILLARY
Glucose-Capillary: 198 mg/dL — ABNORMAL HIGH (ref 70–99)
Glucose-Capillary: 189 mg/dL — ABNORMAL HIGH (ref 70–99)
Glucose-Capillary: 170 mg/dL — ABNORMAL HIGH (ref 70–99)
Glucose-Capillary: 233 mg/dL — ABNORMAL HIGH (ref 70–99)

## 2013-11-02 LAB — APTT: APTT: 97 s — AB (ref 24–37)

## 2013-11-02 LAB — PROTIME-INR
INR: 1.16 (ref 0.00–1.49)
Prothrombin Time: 14.6 seconds (ref 11.6–15.2)

## 2013-11-02 LAB — HEMOGLOBIN A1C
HEMOGLOBIN A1C: 9.1 % — AB (ref <5.7)
MEAN PLASMA GLUCOSE: 214 mg/dL — AB (ref <117)

## 2013-11-02 MED ORDER — ENOXAPARIN SODIUM 120 MG/0.8ML ~~LOC~~ SOLN: 110.0000 mg | Freq: Two times a day (BID) | SUBCUTANEOUS | Status: AC

## 2013-11-02 MED ORDER — ENOXAPARIN SODIUM 120 MG/0.8ML ~~LOC~~ SOLN
110.0000 mg | Freq: Two times a day (BID) | SUBCUTANEOUS | Status: DC
  Administered 2013-11-02 – 2013-11-03 (×3): 110 mg via SUBCUTANEOUS
  Filled 2013-11-02 (×4): qty 0.8

## 2013-11-02 MED ORDER — ENOXAPARIN SODIUM 120 MG/0.8ML ~~LOC~~ SOLN
110.0000 mg | Freq: Once | SUBCUTANEOUS | Status: AC
  Administered 2013-11-02: 110 mg via SUBCUTANEOUS
  Filled 2013-11-02: qty 0.8

## 2013-11-02 NOTE — Discharge Summary (Addendum)
Physician Discharge Summary  Theodore Flores [facility]:[id number] DOB: [birth year] DOA: 11/01/2013  PCP: Angelica Chessman, MD  Admit date: 11/01/2013 Discharge date: 11/03/13  Time spent: 35 minutes  Recommendations for Outpatient Follow-up:  1. Follow up with PCP in 1-2 weeks 2. The patient will be recommended to continue Lovenox for at least 9 months, may consider a longer course given his history of scrotal cancer - will defer to PCP in deciding this  Discharge Diagnoses:  Active Problems:   Hypertension   Diabetes mellitus type 2 in obese   Malignant neoplasm of scrotum   PE (pulmonary embolism)   Discharge Condition: Stable  Diet recommendation: Diabetic  Filed Weights   11/01/13 1744 11/02/13 0013  Weight: 108.863 kg (240 lb) 109.09 kg (240 lb 8 oz)    History of present illness:  Theodore Flores is a 58 y.o. male  History of squamous cell carcinoma of the scrotum, diabetes mellitus, hypertension that presents emergency department for shortness of breath. Patient has had progressive shortness of breath as well as palpitations for approximately one week. Has been progressive in nature. He was hospitalized for one month at [facility] for this scrotal cancer. Patient did have removal of the cancer, and did have wound VAC at one point, is currently receiving home health for his wound. At this time patient denies any contacts or recent illness or recent travel. He does admit to being more immobile for the last month.  Hospital Course:  Dyspnea secondary to bilateral pulmonary embolism  Patient was initially started on heparin drip, however, transitioned to full dose lovenox due to possible relation to malignancy. CT revealed massive bilateral pulmonary emboli, right ventricle/left ventricular ratio normal. He does have risk factors for a blood clot including recent diagnosis of scrotal squamous cell cancer as well as immobility in the last few weeks. At this time vital signs are stable,  patient remains slightly tachycardic, otherwise stable. Plan for continued lovenox in the setting of cancer history Squamous cell carcinoma of the scrotum  Patient currently has a wound secondary to a removal of tumor. Patient was recently hospitalized at [facility] approximately for one month. Have asked wound care to evaluate and treat patient. Will also continue his pain regimen, oxycodone and morphine when necessary.  Normocytic Anemia  Hemoglobin currently 9.5. Appears that his baseline is approximately 10. We'll continue to monitor CBC.  Diabetes mellitus  During this admit, held metformin. Patient be placed on insulin sliding scale with CBG monitoring. Will also obtain hemoglobin A1c.  Hypertension  Continue lisinopril.  Discharge Exam: Filed Vitals:   11/02/13 0521 11/02/13 1345 11/02/13 2300 11/03/13 0500  BP: 150/90 167/96 136/70 159/81  Pulse: 117 118 110 118  Temp: 98.3 F (36.8 C) 98.5 F (36.9 C) 98.6 F (37 C) 98.2 F (36.8 C)  TempSrc: Oral Oral Oral Oral  Resp: 20 18 18 16   Height:      Weight:      SpO2: 100% 100% 100% 98%   General: Awake, in nad Cardiovascular: Regular, s1, s2 Respiratory: normal resp effort, no wheezing  Discharge Instructions     Medication List         acetaminophen 325 MG tablet  Commonly known as:  TYLENOL  Take 650 mg by mouth every 6 (six) hours as needed (PAIN).     docusate sodium 100 MG capsule  Commonly known as:  COLACE  Take 100 mg by mouth 2 (two) times daily.     enoxaparin 120 MG/0.8ML  injection  Commonly known as:  LOVENOX  Inject 0.73 mLs (110 mg total) into the skin every 12 (twelve) hours.     lisinopril 10 MG tablet  Commonly known as:  PRINIVIL,ZESTRIL  Take 2 tablets (20 mg total) by mouth daily.     metFORMIN 1000 MG tablet  Commonly known as:  GLUCOPHAGE  Take 0.5 tablets (500 mg total) by mouth 2 (two) times daily with a meal.     morphine 15 MG tablet  Commonly known as:  MSIR  Take 15 mg by mouth  every 4 (four) hours as needed for pain.     oxyCODONE 5 MG immediate release tablet  Commonly known as:  Oxy IR/ROXICODONE  Take 5-10 mg by mouth every 4 (four) hours as needed for pain.     pantoprazole 40 MG tablet  Commonly known as:  PROTONIX  Take 1 tablet (40 mg total) by mouth daily.     polyethylene glycol packet  Commonly known as:  MIRALAX / GLYCOLAX  Take 17 g by mouth daily.     senna 8.6 MG Tabs tablet  Commonly known as:  SENOKOT  Take 1 tablet by mouth 2 (two) times daily.       No Known Allergies Follow-up Information   Follow up with JEGEDE, OLUGBEMIGA, MD. Schedule an appointment as soon as possible for a visit in 1 week.   Specialty:  Internal Medicine   Contact information:   [place] [address] [phone]       The results of significant diagnostics from this hospitalization (including imaging, microbiology, ancillary and laboratory) are listed below for reference.    Significant Diagnostic Studies: Dg Chest 2 View  11/01/2013   ADDENDUM REPORT: 11/01/2013 21:15  ADDENDUM: The above impression should read :1. Mild left base pleural parenchymal scarring.   Electronically Signed   By: Marcello Moores  Register   On: 11/01/2013 21:15   11/01/2013   CLINICAL DATA:  Shortness of breath.  EXAM: CHEST  2 VIEW  COMPARISON:  None.  FINDINGS: Mediastinum and hilar structures normal. Cardiomegaly. Pulmonary vascularity is normal. No pleural effusion or pneumothorax. Mild left base atelectasis pleural-parenchymal scarring. No acute bony abnormality .  IMPRESSION: Femoral Mild left base pleural parenchymal scarring.  2.  Cardiomegaly.  No CHF.  Electronically Signed: ByMarcello Moores  Register On: 11/01/2013 19:32   Ct Angio Chest Pe W/cm &/or Wo Cm  11/01/2013   CLINICAL DATA:  Shortness of breath.  EXAM: CT ANGIOGRAPHY CHEST WITH CONTRAST  TECHNIQUE: Multidetector CT imaging of the chest was performed using the standard protocol during bolus administration of  intravenous contrast. Multiplanar CT image reconstructions and MIPs were obtained to evaluate the vascular anatomy.  CONTRAST:  130mL OMNIPAQUE IOHEXOL 350 MG/ML SOLN  COMPARISON:  DG CHEST 2 VIEW dated 11/01/2013.  FINDINGS: Thoracic aorta normal caliber. No evidence of dissection or aneurysm. Massive bilateral main pulmonary artery emboli are present. These extend into segmental branches. Right ventricular/ left ventricular ratio normal. Cardiomegaly. Coronary artery disease. No pericardial effusion. Shotty mediastinal lymph nodes noted. Thoracic esophagus is unremarkable.  Large airways are patent. Mild basilar atelectasis. No pleural effusion pneumothorax.  Visualized upper abdominal viscera unremarkable.  Thyroid is unremarkable. Submental small lymph nodes are present. Chest wall is intact. No acute bony abnormality.  Review of the MIP images confirms the above findings.  IMPRESSION: 1. Massive bilateral pulmonary emboli. Right ventricular /left ventricular ratio normal. Critical Value/emergent results were called by telephone at the time  of interpretation on 11/01/2013 at 9:10 PM to Dr. Dalia Heading , who verbally acknowledged these results.  2.  Coronary artery disease.   Electronically Signed   By: Marcello Moores  Register   On: 11/01/2013 21:13    Microbiology: No results found for this or any previous visit (from the past 240 hour(s)).   Labs: Basic Metabolic Panel:  Recent Labs Lab 11/01/13 1826 11/02/13 0526  NA 137 138  K 4.9 4.1  CL 99 103  CO2 22 23  GLUCOSE 330* 215*  BUN 20 13  CREATININE 1.34 1.08  CALCIUM 10.0 9.5   Liver Function Tests:  Recent Labs Lab 11/01/13 1826  AST 21  ALT 16  ALKPHOS 92  BILITOT <0.2*  PROT 8.4*  ALBUMIN 3.1*   No results found for this basename: LIPASE, AMYLASE,  in the last 168 hours No results found for this basename: AMMONIA,  in the last 168 hours CBC:  Recent Labs Lab 11/01/13 1826 11/02/13 0526 11/03/13 0325  WBC 7.0 6.8 6.6   NEUTROABS 5.6  --   --   HGB 9.5* 8.6* 8.6*  HCT 29.4* 26.9* 27.0*  MCV 86.5 87.6 86.8  PLT 268 240 271   Cardiac Enzymes: No results found for this basename: CKTOTAL, CKMB, CKMBINDEX, TROPONINI,  in the last 168 hours BNP: BNP (last 3 results) No results found for this basename: PROBNP,  in the last 8760 hours CBG:  Recent Labs Lab 11/02/13 0727 11/02/13 1203 11/02/13 1602 11/02/13 2323 11/03/13 0745  GLUCAP 198* 189* 170* 233* 204*    Signed:  CHIU, STEPHEN K  [facility] 11/03/2013, 11:56 AM

## 2013-11-02 NOTE — Progress Notes (Signed)
Patient ambulated in the hallway on room air and sats stayed at 100%, heart rate increased- 130's.  Patient did not seem short of breath.

## 2013-11-02 NOTE — Care Management Note (Signed)
    Page 1 of 1   11/02/2013     11:19:21 AM   CARE MANAGEMENT NOTE 11/02/2013  Patient:  Theodore Flores, Theodore Flores   Account Number:  [id number]  Date Initiated:  11/02/2013  Documentation initiated by:  Dessa Phi  Subjective/Objective Assessment:   58 Y/O M ADMITTED W/BILAT PE.     Action/Plan:   FROM HOME.   Anticipated DC Date:  11/04/2013   Anticipated DC Plan:  [place]  CM consult      Choice offered to / List presented to:             Status of service:  In process, will continue to follow Medicare Important Message given?   (If response is "NO", the following Medicare IM given date fields will be blank) Date Medicare IM given:   Date Additional Medicare IM given:    Discharge Disposition:    Per UR Regulation:  Reviewed for med. necessity/level of care/duration of stay  If discussed at [place] of Stay Meetings, dates discussed:    Comments:  11/02/13 Kourtlyn Charlet MAHABIFR RN,BSN NCM [phone] NO ANTICIPATED D/C NEEDS.

## 2013-11-02 NOTE — Consult Note (Signed)
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  Reason for Consult: scrotal wound. Has history of NPWT VAC use to this area, however when admitted this has been discontinued some time ago.  His wife and the patient are managing the wounds to his scrotum with moist to moist gauze dressing.   Wound type: surgical wound  Wound bed: clean, pink, moist 100% granulation tissue Drainage (amount, consistency, odor) moderate serous drainage Periwound: intact, some maceration on the right side of the testicle Dressing procedure/placement/frequency: continue moist gauze dressing, hold in place with mesh underwear.   Discussed POC with patient and bedside nurse.  Re consult if needed, will not follow at this time. Thanks  Theodore Flores, [place] [phone])

## 2013-11-02 NOTE — Progress Notes (Signed)
Inpatient Diabetes Program Recommendations  AACE/ADA: New Consensus Statement on Inpatient Glycemic Control (2013)  Target Ranges:  Prepandial:   less than 140 mg/dL      Peak postprandial:   less than 180 mg/dL (1-2 hours)      Critically ill patients:  140 - 180 mg/dL   Reason for Assessment: Hyperglycemia  Diabetes history: DM2 Outpatient Diabetes medications: metformin 500 bid Current orders for Inpatient glycemic control: Novolog moderate tidwc  Results for Theodore Flores, Theodore Flores (MRN [id number]) as of 11/02/2013 10:46  Ref. Range 11/01/2013 18:26 11/02/2013 05:26  Sodium Latest Range: 136-145 mEq/L 137 138  Potassium Latest Range: 3.5-5.1 mEq/L 4.9 4.1  Chloride Latest Range: 96-112 mEq/L 99 103  CO2 Latest Range: 19-32 mEq/L 22 23  BUN Latest Range: 7.0-26.0 mg/dL 20 13  Creatinine Latest Range: 0.50-1.35 mg/dL 1.34 1.08  Calcium Latest Range: 8.4-10.5 mg/dL 10.0 9.5  GFR calc non Af Amer Latest Range: >90 mL/min 56 (L) 73 (L)  GFR calc Af Amer Latest Range: >90 mL/min 65 (L) 85 (L)  Glucose Latest Range: 70-99 mg/dl 330 (H) 215 (H)    Inpatient Diabetes Program Recommendations Insulin - Basal: Consider addition of Lantus 10 units QHS Correction (SSI): Add HS correction Insulin - Meal Coverage: When po intake increases, may benefit from addition of Novolog 3 units tidwc  Note: Will continue to follow. Thank you. Lorenda Peck, RD, LDN, CDE Inpatient Diabetes Coordinator [phone]

## 2013-11-02 NOTE — Progress Notes (Signed)
ANTICOAGULATION CONSULT NOTE - Initial Consult  Pharmacy Consult for Lovenox (transition from IV heparin) Indication: pulmonary embolus  No Known Allergies  Patient Measurements: Height: 6' (182.9 cm) Weight: 240 lb 8 oz (109.09 kg) IBW/kg (Calculated) : 77.6 Heparin Dosing Weight: 105  Vital Signs: Temp: 97.4 F (36.3 C) (03/18 0013) Temp src: Oral (03/18 0013) BP: 162/89 mmHg (03/18 0013) Pulse Rate: 112 (03/18 0013)  Labs:  Recent Labs  11/01/13 1826 11/01/13 2249  HGB 9.5*  --   HCT 29.4*  --   PLT 268  --   APTT  --  97*  LABPROT  --  14.6  INR  --  1.16  CREATININE 1.34  --     Estimated Creatinine Clearance: 75.7 ml/min (by C-G formula based on Cr of 1.34).   Medical History: Past Medical History  Diagnosis Date  . Diabetes mellitus without complication   . Hypertension   . Mass of right inguinal region   . Squamous cell carcinoma   . Adrenal adenoma   . Sigmoid diverticulitis   . Skin cancer     Medications:  Facility-administered medications prior to admission  Medication Dose Route Frequency Provider Last Rate Last Dose  . [COMPLETED] insulin aspart (novoLOG) injection 20 Units  20 Units Subcutaneous Once Reyne Dumas, MD   20 Units at 11/01/13 1739   Prescriptions prior to admission  Medication Sig Dispense Refill  . acetaminophen (TYLENOL) 325 MG tablet Take 650 mg by mouth every 6 (six) hours as needed (PAIN).      Marland Kitchen docusate sodium (COLACE) 100 MG capsule Take 100 mg by mouth 2 (two) times daily.      Marland Kitchen lisinopril (PRINIVIL,ZESTRIL) 10 MG tablet Take 2 tablets (20 mg total) by mouth daily.  30 tablet  3  . metFORMIN (GLUCOPHAGE) 1000 MG tablet Take 0.5 tablets (500 mg total) by mouth 2 (two) times daily with a meal.  60 tablet  3  . morphine (MSIR) 15 MG tablet Take 15 mg by mouth every 4 (four) hours as needed for pain.      Marland Kitchen oxyCODONE (OXY IR/ROXICODONE) 5 MG immediate release tablet Take 5-10 mg by mouth every 4 (four) hours as needed  for pain.      . pantoprazole (PROTONIX) 40 MG tablet Take 1 tablet (40 mg total) by mouth daily.  30 tablet  3  . polyethylene glycol (MIRALAX / GLYCOLAX) packet Take 17 g by mouth daily.      Marland Kitchen senna (SENOKOT) 8.6 MG TABS Take 1 tablet by mouth 2 (two) times daily.       Scheduled:  Infusions:   Assessment: 58yo M with weakness, hyperglycemia, tachycardia, and SOB. CT revealed massive bilateral PE. Wife reports recent admission at [facility] for surgery for squamous cell cancer of the scrotum. Pharmacy is asked to transition pt from IV heparin to Lovenox  On no anticoagulants PTA.  H/H is low. Platelets are ok.  No bleeding reported/documented.  Goal of Therapy:  Heparin level 0.3-0.7 units/ml Monitor platelets by anticoagulation protocol: Yes   Plan:   Stop IV heparin drip @ 0100 (walked to unit and told RN to stop drip)  Start Lovenox @ 0200 - will f/u with RN   Then Lovenox 110 mg q12h next @ noon 3/18  F/u Scr/CBC daily  F/u plan for oral anticoagulation vs Lovenox  Lawana Pai R  11/02/2013,1:01 AM.

## 2013-11-02 NOTE — Progress Notes (Signed)
[facility] PROGRESS NOTE  Theodore Flores DGU:[id number] DOB: [birth year] DOA: 11/01/2013 PCP: Angelica Chessman, MD  Assessment/Plan: Dyspnea secondary to bilateral pulmonary embolism  Patient was initially started on heparin drip, however, transitioned to full dose lovenox due to possible relation to malignancy. CT revealed massive bilateral pulmonary emboli, right ventricle/left ventricular ratio normal. He does have risk factors for a blood clot including recent diagnosis of scrotal squamous cell cancer as well as immobility in the last few weeks. At this time vital signs are stable, patient remains slightly tachycardic. Squamous cell carcinoma of the scrotum  Patient currently has a wound secondary to a removal of tumor. Patient was recently hospitalized at [facility] approximately for one month. Dressings recently changed per WOC. Appreciate recs. Normocytic Anemia  Hemoglobin currently 9.5. Appears that his baseline is approximately 10. We'll continue to monitor CBC.  Diabetes mellitus  Held his metformin. Patient be placed on insulin sliding scale with CBG monitoring. Hemoglobin A1c of 9.1 Hypertension  Continue lisinopril.  Code Status: Full Family Communication: Pt in room (indicate person spoken with, relationship, and if by phone, the number) Disposition Plan: Poss d/c today   Consultants:    Procedures:    Antibiotics:   (indicate start date, and stop date if known)  HPI/Subjective: No acute events noted overnight  Objective: Filed Vitals:   11/01/13 2050 11/01/13 2136 11/02/13 0013 11/02/13 0521  BP: 140/85  162/89 150/90  Pulse: 123  112 117  Temp: 97.6 F (36.4 C)  97.4 F (36.3 C) 98.3 F (36.8 C)  TempSrc: Oral  Oral Oral  Resp: 19  20 20   Height:   6' (1.829 m)   Weight:   109.09 kg (240 lb 8 oz)   SpO2: 93% 95% 100% 100%    Intake/Output Summary (Last 24 hours) at 11/02/13 1030 Last data filed at 11/02/13 0930  Gross per 24 hour  Intake     486 ml  Output    725 ml  Net   -239 ml   Filed Weights   11/01/13 1744 11/02/13 0013  Weight: 108.863 kg (240 lb) 109.09 kg (240 lb 8 oz)    Exam:   General:  Awake, in nad  Cardiovascular: regular, tachycardic  Respiratory: normal resp effort, no wheezing  Abdomen: soft, nondistended  Musculoskeletal: perfused, no clubbing   Data Reviewed: Basic Metabolic Panel:  Recent Labs Lab 11/01/13 1826 11/02/13 0526  NA 137 138  K 4.9 4.1  CL 99 103  CO2 22 23  GLUCOSE 330* 215*  BUN 20 13  CREATININE 1.34 1.08  CALCIUM 10.0 9.5   Liver Function Tests:  Recent Labs Lab 11/01/13 1826  AST 21  ALT 16  ALKPHOS 92  BILITOT <0.2*  PROT 8.4*  ALBUMIN 3.1*   No results found for this basename: LIPASE, AMYLASE,  in the last 168 hours No results found for this basename: AMMONIA,  in the last 168 hours CBC:  Recent Labs Lab 11/01/13 1826 11/02/13 0526  WBC 7.0 6.8  NEUTROABS 5.6  --   HGB 9.5* 8.6*  HCT 29.4* 26.9*  MCV 86.5 87.6  PLT 268 240   Cardiac Enzymes: No results found for this basename: CKTOTAL, CKMB, CKMBINDEX, TROPONINI,  in the last 168 hours BNP (last 3 results) No results found for this basename: PROBNP,  in the last 8760 hours CBG:  Recent Labs Lab 11/02/13 0727  GLUCAP 198*    No results found for this or any previous visit (from the past  240 hour(s)).   Studies: Dg Chest 2 View  11/01/2013   ADDENDUM REPORT: 11/01/2013 21:15  ADDENDUM: The above impression should read :1. Mild left base pleural parenchymal scarring.   Electronically Signed   By: Marcello Moores  Register   On: 11/01/2013 21:15   11/01/2013   CLINICAL DATA:  Shortness of breath.  EXAM: CHEST  2 VIEW  COMPARISON:  None.  FINDINGS: Mediastinum and hilar structures normal. Cardiomegaly. Pulmonary vascularity is normal. No pleural effusion or pneumothorax. Mild left base atelectasis pleural-parenchymal scarring. No acute bony abnormality .  IMPRESSION: Femoral Mild left base pleural  parenchymal scarring.  2.  Cardiomegaly.  No CHF.  Electronically Signed: ByMarcello Moores  Register On: 11/01/2013 19:32   Ct Angio Chest Pe W/cm &/or Wo Cm  11/01/2013   CLINICAL DATA:  Shortness of breath.  EXAM: CT ANGIOGRAPHY CHEST WITH CONTRAST  TECHNIQUE: Multidetector CT imaging of the chest was performed using the standard protocol during bolus administration of intravenous contrast. Multiplanar CT image reconstructions and MIPs were obtained to evaluate the vascular anatomy.  CONTRAST:  17mL OMNIPAQUE IOHEXOL 350 MG/ML SOLN  COMPARISON:  DG CHEST 2 VIEW dated 11/01/2013.  FINDINGS: Thoracic aorta normal caliber. No evidence of dissection or aneurysm. Massive bilateral main pulmonary artery emboli are present. These extend into segmental branches. Right ventricular/ left ventricular ratio normal. Cardiomegaly. Coronary artery disease. No pericardial effusion. Shotty mediastinal lymph nodes noted. Thoracic esophagus is unremarkable.  Large airways are patent. Mild basilar atelectasis. No pleural effusion pneumothorax.  Visualized upper abdominal viscera unremarkable.  Thyroid is unremarkable. Submental small lymph nodes are present. Chest wall is intact. No acute bony abnormality.  Review of the MIP images confirms the above findings.  IMPRESSION: 1. Massive bilateral pulmonary emboli. Right ventricular /left ventricular ratio normal. Critical Value/emergent results were called by telephone at the time of interpretation on 11/01/2013 at 9:10 PM to Dr. Dalia Heading , who verbally acknowledged these results.  2.  Coronary artery disease.   Electronically Signed   By: Marcello Moores  Register   On: 11/01/2013 21:13    Scheduled Meds: . docusate sodium  100 mg Oral BID  . enoxaparin (LOVENOX) injection  110 mg Subcutaneous Q12H  . insulin aspart  0-15 Units Subcutaneous TID WC  . lisinopril  20 mg Oral Daily  . pantoprazole  40 mg Oral Daily  . senna  1 tablet Oral BID  . sodium chloride  3 mL Intravenous  Q12H  . sodium chloride  3 mL Intravenous Q12H   Continuous Infusions:   Active Problems:   PE (pulmonary embolism)  Time spent: 78min  Briele Lagasse, [facility] Pager [phone]. If 7PM-7AM, please contact night-coverage at [email or web address], password [facility] 11/02/2013, 10:30 AM  LOS: 1 day

## 2013-11-03 LAB — CBC
HEMATOCRIT: 27 % — AB (ref 39.0–52.0)
HEMOGLOBIN: 8.6 g/dL — AB (ref 13.0–17.0)
MCH: 27.7 pg (ref 26.0–34.0)
MCHC: 31.9 g/dL (ref 30.0–36.0)
MCV: 86.8 fL (ref 78.0–100.0)
Platelets: 271 10*3/uL (ref 150–400)
RBC: 3.11 MIL/uL — ABNORMAL LOW (ref 4.22–5.81)
RDW: 13.7 % (ref 11.5–15.5)
WBC: 6.6 10*3/uL (ref 4.0–10.5)

## 2013-11-03 LAB — GLUCOSE, CAPILLARY
Glucose-Capillary: 204 mg/dL — ABNORMAL HIGH (ref 70–99)
Glucose-Capillary: 278 mg/dL — ABNORMAL HIGH (ref 70–99)

## 2013-11-03 NOTE — Progress Notes (Signed)
i will arrange appt when pt is discharged

## 2013-11-15 ENCOUNTER — Ambulatory Visit: Payer: 59 | Attending: Internal Medicine | Admitting: Internal Medicine

## 2013-11-15 ENCOUNTER — Encounter: Payer: Self-pay | Admitting: Internal Medicine

## 2013-11-15 VITALS — BP 146/93 | HR 138 | Temp 98.8°F | Resp 16 | Ht 72.0 in | Wt 242.0 lb

## 2013-11-15 DIAGNOSIS — I10 Essential (primary) hypertension: Secondary | ICD-10-CM | POA: Insufficient documentation

## 2013-11-15 DIAGNOSIS — R0989 Other specified symptoms and signs involving the circulatory and respiratory systems: Secondary | ICD-10-CM

## 2013-11-15 DIAGNOSIS — I2699 Other pulmonary embolism without acute cor pulmonale: Secondary | ICD-10-CM | POA: Insufficient documentation

## 2013-11-15 DIAGNOSIS — C632 Malignant neoplasm of scrotum: Secondary | ICD-10-CM | POA: Insufficient documentation

## 2013-11-15 DIAGNOSIS — R030 Elevated blood-pressure reading, without diagnosis of hypertension: Secondary | ICD-10-CM

## 2013-11-15 DIAGNOSIS — Z7901 Long term (current) use of anticoagulants: Secondary | ICD-10-CM | POA: Insufficient documentation

## 2013-11-15 DIAGNOSIS — Z791 Long term (current) use of non-steroidal anti-inflammatories (NSAID): Secondary | ICD-10-CM | POA: Insufficient documentation

## 2013-11-15 DIAGNOSIS — E119 Type 2 diabetes mellitus without complications: Secondary | ICD-10-CM | POA: Insufficient documentation

## 2013-11-15 DIAGNOSIS — R000 Tachycardia, unspecified: Secondary | ICD-10-CM | POA: Insufficient documentation

## 2013-11-15 DIAGNOSIS — Z79899 Other long term (current) drug therapy: Secondary | ICD-10-CM | POA: Insufficient documentation

## 2013-11-15 LAB — GLUCOSE, POCT (MANUAL RESULT ENTRY): POC Glucose: 246 mg/dl — AB (ref 70–99)

## 2013-11-15 MED ORDER — PANTOPRAZOLE SODIUM 40 MG PO TBEC: 40.0000 mg | DELAYED_RELEASE_TABLET | Freq: Every day | ORAL | Status: DC

## 2013-11-15 MED ORDER — MORPHINE SULFATE 30 MG PO TABS: 30.0000 mg | ORAL_TABLET | Freq: Four times a day (QID) | ORAL | Status: DC | PRN

## 2013-11-15 MED ORDER — METOPROLOL TARTRATE 25 MG PO TABS: 25.0000 mg | ORAL_TABLET | Freq: Two times a day (BID) | ORAL | Status: DC

## 2013-11-15 NOTE — Progress Notes (Signed)
Pt is here today f/u after a hospital visit for pulmonary blood clots.  Pt states that he has extreme pain in his scrotum.

## 2013-11-15 NOTE — Progress Notes (Signed)
Patient ID: Theodore Flores, male   DOB: 09-Sep-1955, 58 y.o.   MRN: [id number]   Theodore Flores, is a 58 y.o. male  EXN:[id number]  WHQ:[id number]  DOB - 03/26/1956  Chief Complaint  Patient presents with  . Follow-up        Subjective:   Theodore Flores is a 58 y.o. male here today for a follow up visit. Patient has history of hypertension, diabetes with last hemoglobin A1c of 8.2, He was recently diagnosed with pulmonary embolism and now on Lovenox subcutaneously for anticoagulation. The patient had a prolonged hospital stay lengthy [facility] after having surgery on his scrotum for squamous cell carcinoma, patient was discharged on 10/06/13. He was seen in our clinic for followup hospital admission and found to be tachycardic with chest pain and shortness of breath, he was taken to the ER where he was from pulmonary embolism.  His heart rate has always been between 110 and 140 beats per minute even after discharge from the hospital. He is here today for followup visit, he has no new complaint, wife is helping with dressing over wounds from surgical scar in the perineum and left thigh as well as a wound care specialist who visits patient at home. He claims to have all his medications except for pain, and he is feeling his heart racing. Previous EKGs have shown sinus tachycardia.  Today the patient's heart rate is in the 138. He claims his blood sugars under control at home as well as his blood pressure except when he is in pain. Patient has No headache, No chest pain, No abdominal pain - No Nausea, No new weakness tingling or numbness, No Cough - SOB.  Problem  Diabetes  Tachycardia With Hypertension    ALLERGIES: No Known Allergies  PAST MEDICAL HISTORY: Past Medical History  Diagnosis Date  . Diabetes mellitus without complication   . Hypertension   . Mass of right inguinal region   . Squamous cell carcinoma   . Adrenal adenoma   . Sigmoid diverticulitis   . Skin cancer      MEDICATIONS AT HOME: Prior to Admission medications   Medication Sig Start Date End Date Taking? Authorizing Provider  acetaminophen (TYLENOL) 325 MG tablet Take 650 mg by mouth every 6 (six) hours as needed (PAIN).   Yes Historical Provider, MD  docusate sodium (COLACE) 100 MG capsule Take 100 mg by mouth 2 (two) times daily.   Yes Historical Provider, MD  enoxaparin (LOVENOX) 120 MG/0.8ML injection Inject 0.73 mLs (110 mg total) into the skin every 12 (twelve) hours. 11/02/13  Yes Donne Hazel, MD  lisinopril (PRINIVIL,ZESTRIL) 10 MG tablet Take 2 tablets (20 mg total) by mouth daily. 03/11/13  Yes Clanford Marisa Hua, MD  metFORMIN (GLUCOPHAGE) 1000 MG tablet Take 0.5 tablets (500 mg total) by mouth 2 (two) times daily with a meal. 03/11/13  Yes Clanford L Johnson, MD  oxyCODONE (OXY IR/ROXICODONE) 5 MG immediate release tablet Take 5-10 mg by mouth every 4 (four) hours as needed for pain.   Yes Historical Provider, MD  polyethylene glycol (MIRALAX / GLYCOLAX) packet Take 17 g by mouth daily.   Yes Historical Provider, MD  senna (SENOKOT) 8.6 MG TABS Take 1 tablet by mouth 2 (two) times daily.   Yes Historical Provider, MD  metoprolol tartrate (LOPRESSOR) 25 MG tablet Take 1 tablet (25 mg total) by mouth 2 (two) times daily. 11/15/13   Angelica Chessman, MD  morphine (MSIR) 30 MG tablet Take 1 tablet (  30 mg total) by mouth every 6 (six) hours as needed for moderate pain. 11/15/13   Angelica Chessman, MD  pantoprazole (PROTONIX) 40 MG tablet Take 1 tablet (40 mg total) by mouth daily. 11/15/13   Angelica Chessman, MD     Objective:   Filed Vitals:   11/15/13 0949  BP: 146/93  Pulse: 138  Temp: 98.8 F (37.1 C)  TempSrc: Oral  Resp: 16  Height: 6' (1.829 m)  Weight: 242 lb (109.77 kg)  SpO2: 99%    Exam General appearance : Awake, alert, not in any distress. Speech Clear. Chronically ill-looking HEENT: Atraumatic and Normocephalic, pupils equally reactive to light and  accomodation Neck: supple, no JVD. No cervical lymphadenopathy.  Chest:Good air entry bilaterally, no added sounds  CVS: S1 S2 regular, no murmurs.  Abdomen: Bowel sounds present, Non tender and not distended with no gaurding, rigidity or rebound. Extremities: Extensive surgical scar with some area of wound dehiscent, dressed on the left thigh, perineum is also dressed, no sign of infection, no post discharge Neurology: Awake alert, and oriented X 3, CN II-XII intact, Non focal Skin:No Rash Wounds:N/A  Data Review Lab Results  Component Value Date   HGBA1C 9.1* 11/02/2013   HGBA1C 8.4 11/01/2013   HGBA1C 8.2* 11/22/2012     Assessment & Plan   1. Diabetes  - Glucose (CBG) done today but acceptable glycemia Continue current regimen of metformin 1000 mg by mouth twice a day  2. Malignant neoplasm of scrotum  - pantoprazole (PROTONIX) 40 MG tablet; Take 1 tablet (40 mg total) by mouth daily.  Dispense: 90 tablet; Refill: 3 - morphine (MSIR) 30 MG tablet; Take 1 tablet (30 mg total) by mouth every 6 (six) hours as needed for moderate pain.  Dispense: 90 tablet; Refill: 0  Continue Lovenox for pulmonary embolism to achieve anticoagulation Patient was offered different modes of anticoagulation including oral Xarelto or Coumadin, but he and the wife preferred to use Lovenox for now  3. Hypertension Continue lisinopril 10 mg tablet by mouth daily  4. Tachycardia with hypertension Will start patient on beta blocker for tachycardia and uncontrolled hypertension - metoprolol tartrate (LOPRESSOR) 25 MG tablet; Take 1 tablet (25 mg total) by mouth 2 (two) times daily.  Dispense: 180 tablet; Refill: 3   Patient was counseled extensively on smoking cessation   Return in about 4 weeks (around 12/13/2013), or if symptoms worsen or fail to improve, for Pulmonary Embolism and Anticoagulation.  The patient was given clear instructions to go to ER or return to medical center if symptoms don't  improve, worsen or new problems develop. The patient verbalized understanding. The patient was told to call to get lab results if they haven't heard anything in the next week.   This note has been created with Surveyor, quantity. Any transcriptional errors are unintentional.    Angelica Chessman, MD, [place], [place], [place] and [facility] [place], [place]   11/15/2013, 10:42 AM

## 2013-12-02 ENCOUNTER — Inpatient Hospital Stay: Payer: 59 | Admitting: Internal Medicine

## 2013-12-09 ENCOUNTER — Ambulatory Visit: Payer: 59 | Attending: Internal Medicine | Admitting: Internal Medicine

## 2013-12-09 ENCOUNTER — Encounter: Payer: Self-pay | Admitting: Internal Medicine

## 2013-12-09 VITALS — BP 110/75 | HR 146 | Temp 97.7°F | Resp 20 | Ht 72.0 in

## 2013-12-09 DIAGNOSIS — R000 Tachycardia, unspecified: Secondary | ICD-10-CM

## 2013-12-09 DIAGNOSIS — C632 Malignant neoplasm of scrotum: Secondary | ICD-10-CM | POA: Insufficient documentation

## 2013-12-09 DIAGNOSIS — E119 Type 2 diabetes mellitus without complications: Secondary | ICD-10-CM | POA: Insufficient documentation

## 2013-12-09 DIAGNOSIS — Z87891 Personal history of nicotine dependence: Secondary | ICD-10-CM | POA: Insufficient documentation

## 2013-12-09 DIAGNOSIS — I10 Essential (primary) hypertension: Secondary | ICD-10-CM | POA: Insufficient documentation

## 2013-12-09 DIAGNOSIS — Z09 Encounter for follow-up examination after completed treatment for conditions other than malignant neoplasm: Secondary | ICD-10-CM | POA: Insufficient documentation

## 2013-12-09 MED ORDER — MORPHINE SULFATE ER 100 MG PO TBCR: 100.0000 mg | EXTENDED_RELEASE_TABLET | Freq: Two times a day (BID) | ORAL | Status: DC

## 2013-12-09 NOTE — Progress Notes (Signed)
Patient here for follow-up. Experiencing pain in pelvis, scrotum, and lower extremities.Patient with malignant neoplasm of scrotum. Current pain level is 7/10. Patient taking oxycodone for pain relief. Recently hospitalized 11/23/13-12/01/13 for pain control.  Now on home hospice. PICC line in place. Patient's heart rate elevated-146. Discussed with Chari Manning, NP. EKG done and report given to Chari Manning, NP.

## 2013-12-09 NOTE — Patient Instructions (Addendum)
Metastatic Cancer, Questions and Answers KEY POINTS  Cancer happens when cells become abnormal and grow without control.  Where the cancer started is called the primary cancer or the primary tumor.  Metastatic cancer happens when cancer cells spread from the place where it started to other parts of the body.  When cancer spreads, the metastatic cancer keeps the same type of cells and the same name as the primary tumor.  The most common sites of metastasis are the lungs, bones, liver, and brain.  Treatment for metastatic cancer usually depends on the type of cancer. It also depends on the size and location of the metastasis. WHAT IS CANCER?   Cancer is a group of many related diseases. All cancers begin in cells. Cells are the building blocks that make up tissues. Cancer that arises from organs and solid tissues is called a solid tumor. Cancer that begins in blood cells is called leukemia, multiple myeloma, or lymphoma.  Normally, cells grow and divide to form new cells as the body needs them. When cells grow old and die, new cells take their place. Sometimes this orderly process goes wrong. New cells form when the body does not need them. Old cells do not die when they should.  The extra cells form a mass of tissue. This is called a growth or tumor. Tumors can be either not cancerous (benign) or cancerous (malignant). Benign tumors do not spread to other parts of the body. They are rarely a threat to life. Malignant tumors can spread (metastasize) and may be life threatening. WHAT IS PRIMARY CANCER?  Cancer can begin in any organ or tissue of the body. The original tumor is called the primary cancer or primary tumor. It is usually named for the part of the body or the type of cell in which it begins. WHAT IS METASTASIS, AND HOW DOES IT HAPPEN?   Metastasis means the spread of cancer. Cancer cells can break away from a primary tumor and enter the bloodstream or lymphatic system. This is the  system that produces, stores, and carries the cells that fight infections. That is how cancer cells spread to other parts of the body.  When cancer cells spread and form a new tumor in a different organ, the new tumor is a metastatic tumor. The cells in the metastatic tumor come from the original tumor. For example, if breast cancer spreads to the lungs, the metastatic tumor in the lung is made up of cancerous breast cells. It is not made of lung cells. In this case, the disease in the lungs is metastatic breast cancer (not lung cancer). Under a microscope, metastatic breast cancer cells generally look the same as the cancer cells in the breast. [place]?   Cancer cells can spread to almost any part of the body. Cancer cells frequently spread to lymph nodes (rounded masses of lymphatic tissue) near the primary tumor (regional lymph nodes). This is called lymph node involvement or regional disease. Cancer that spreads to other organs or to lymph nodes far from the primary tumor is called metastatic disease. Caregivers sometimes also call this distant disease.  The most common sites of metastasis from solid tumors are the lungs, bones, liver, and brain. Some cancers tend to spread to certain parts of the body. For example, lung cancer often metastasizes to the brain or bones. Colon cancer often spreads to the liver. Prostate cancer tends to spread to the bones. Breast cancer commonly spreads to the bones, lungs, liver,  or brain. But each of these cancers can spread to other parts of the body as well.  Because blood cells travel throughout the body, leukemia, multiple myeloma, and lymphoma cells are usually not localized when the cancer is diagnosed. Tumor cells may be found in the blood, several lymph nodes, or other parts of the body such as the liver or bones. This type of spread is not referred to as metastasis. ARE THERE SYMPTOMS OF METASTATIC CANCER?   Some people with metastatic  cancer do not have symptoms. Their metastases are found by X-rays and other tests performed for other reasons.  When symptoms of metastatic cancer occur, the type and frequency of the symptoms will depend on the size and location of the metastasis. For example, cancer that spreads to the bones is likely to cause pain and can lead to bone fractures. Cancer that spreads to the brain can cause a variety of symptoms. These include headaches, seizures, and unsteadiness. Shortness of breath may be a sign of lung involvement. Abdominal swelling or yellowing of the skin (jaundice) can indicate that cancer has spread to the liver.  Sometimes a person's primary cancer is discovered only after the metastatic tumor causes symptoms. For example, a man whose prostate cancer has spread to the bones in his pelvis may have lower back pain (caused by the cancer in his bones) before he experiences any symptoms from the primary tumor in his prostate. HOW DOES THE CAREGIVER KNOW WHETHER A CANCER IS PRIMARY OR A METASTATIC TUMOR?  To determine whether a tumor is primary or metastatic, the tumor will be examined under a microscope. In general, cancer cells look like abnormal versions of cells in the tissue where the cancer began. Using specialized diagnostic tests, a trained person is often able to tell where the cancer cells came from. Markers or antigens found in or on the cancer cells can indicate the primary site of the cancer.  Metastatic cancers may be found before or at the same time as the primary tumor, or months or years later. When a new tumor is found in a patient who has been treated for cancer in the past, it is more often a metastasis than another primary tumor. IS IT POSSIBLE TO HAVE A METASTATIC TUMOR WITHOUT HAVING A PRIMARY CANCER?  No. A metastatic tumor always starts from cancer cells in another part of the body. In most cases, when a metastatic tumor is found first, the primary tumor can be found. The  search for the primary tumor may involve lab tests, X-rays, and other procedures. However, in a small number of cases, a metastatic tumor is diagnosed but the primary tumor cannot be found, in spite of extensive tests. The tumor is metastatic because the cells are not like those in the organ or tissue in which the tumor is found. The primary tumor is called unknown or hidden (occult). The patient is said to have cancer of unknown primary origin (CUP). Because diagnostic techniques are constantly improving, the number of cases of CUP is going down.  WHAT TREATMENTS ARE USED FOR METASTATIC CANCER?   When cancer has metastasized, it may be treated with:  Chemotherapy.  Radiation therapy.  Biological therapy.  Hormone therapy.  Surgery.  Cryosurgery.  A combination of these.  The choice of treatment generally depends on the:  Type of primary cancer.  Size and location of the metastasis.  Patient's age and general health.  Types of treatments the patient has had in the past. In  patients with CUP, it is possible to treat the disease even though the primary tumor has not been located. The goal of treatment may be to control the cancer, or to relieve symptoms or side effects of treatment. ARE NEW TREATMENTS FOR METASTATIC CANCER BEING DEVELOPED?  Yes, many new cancer treatments are under study. To develop new treatments, the [employer] sponsors clinical trials (research studies) with cancer patients in many hospitals, universities, medical schools, and cancer centers around the country. Clinical trials are a critical step in the improvement of treatment. Before any new treatment can be recommended for general use, doctors conduct studies to find out whether the treatment is both safe for patients and effective against the disease. The results of such studies have led to progress not only in the treatment of cancer, but in the detection, diagnosis, and prevention of the disease as well. Patients  interested in taking part in a clinical trial should talk with their caregivers. [place] ([place]): www.cancer.gov Document Released: 12/09/2004 Document Revised: 10/27/2011 Document Reviewed: 07/27/2008 [facility] Patient Information 2014 [place], [place].

## 2013-12-12 NOTE — Progress Notes (Addendum)
Patient ID: Theodore Flores, male   DOB: 1956/07/05, 58 y.o.   MRN: [id number]   CC: HFU- pain of metastatic malignancy  HPI:  Patient is here today as a hospital follow up on his pain of malignancy.  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  He was told by [facility] that there was no more they could do to treat him.  He presents today in severe pain with SOB and tachycardia.  He is accompanied by his wife who states the Hospice nurse has requested that his PCP look at his scrotal wound.  The patient reports that he is unable to walk for long distances without getting SOB.  He reports that this is getting worst with shorter distance.  He reports that is pain is a 10/10 located in his right thigh and scrotum.  He reports that he was given MS Contin 60 mg to take BID for pain control and it has not helped control his pain.  His wife states that he does not have excessive drowsiness with current medication regimen but he does stay awake all night in pain. His wound is open to air and beefy red.  He has a JP drain coming from his right buttock and a right upper arm PICC that is clean and intact.     No Known Allergies Past Medical History  Diagnosis Date  . Diabetes mellitus without complication   . Hypertension   . Mass of right inguinal region   . Squamous cell carcinoma   . Adrenal adenoma   . Sigmoid diverticulitis   . Skin cancer    Current Outpatient Prescriptions on File Prior to Visit  Medication Sig Dispense Refill  . acetaminophen (TYLENOL) 325 MG tablet Take 650 mg by mouth every 6 (six) hours as needed (PAIN).      Marland Kitchen docusate sodium (COLACE) 100 MG capsule Take 100 mg by mouth 2 (two) times daily.      Marland Kitchen enoxaparin (LOVENOX) 120 MG/0.8ML injection Inject 0.73 mLs (110 mg total) into the skin every 12 (twelve) hours.  0 Syringe    . lisinopril (PRINIVIL,ZESTRIL) 10 MG tablet Take 2 tablets (20 mg total) by mouth daily.  30 tablet  3  .  metFORMIN (GLUCOPHAGE) 1000 MG tablet Take 0.5 tablets (500 mg total) by mouth 2 (two) times daily with a meal.  60 tablet  3  . metoprolol tartrate (LOPRESSOR) 25 MG tablet Take 1 tablet (25 mg total) by mouth 2 (two) times daily.  180 tablet  3  . oxyCODONE (OXY IR/ROXICODONE) 5 MG immediate release tablet Take 5-10 mg by mouth every 4 (four) hours as needed for pain.      . pantoprazole (PROTONIX) 40 MG tablet Take 1 tablet (40 mg total) by mouth daily.  90 tablet  3  . polyethylene glycol (MIRALAX / GLYCOLAX) packet Take 17 g by mouth daily.      Marland Kitchen senna (SENOKOT) 8.6 MG TABS Take 1 tablet by mouth 2 (two) times daily.       No current facility-administered medications on file prior to visit.   Family History  Problem Relation Age of Onset  . CVA Father   . Hypertension Father   . Cancer - Other Mother     Doesnot know what type  . Hypertension Mother   . Hypertension Sister   . Hypertension Brother   . Diabetes Mother   . Diabetes Father   .  Diabetes Brother    History   Social History  . Marital Status: Married    Spouse Name: N/A    Number of Children: 3  . Years of Education: N/A   Occupational History  . 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 Smoking status: Former Smoker -- 0.25 packs/day for 40 years  . Smokeless tobacco: Never Used  . Alcohol Use: No     Comment: Social  . Drug Use: No  . Sexual Activity: Not Currently    Partners: Female   Other Topics Concern  . Not on file   Social History Narrative   Lives with wife   2 daughters and 1 son   Truck driver    Review of Systems: Constitutional: Negative for fever, chills, diaphoresis, activity change, appetite change. + Fatigue HENT: Negative for facial swelling, neck pain, neck stiffness.  Respiratory: Negative for cough, choking, chest tightness, wheezing and stridor. +shortness of breath and DOE Cardiovascular: Negative for chest pain, palpitations and leg  swelling. +fast heart beat Gastrointestinal: Negative for abdominal distention.  Musculoskeletal: Negative for back pain, joint swelling, arthralgias. + gait difficulty, uses walker and wheelchair Neurological: Negative for dizziness, tremors, seizures, syncope, facial asymmetry, speech difficulty, weakness, light-headedness, numbness and headaches.  Psychiatric/Behavioral: Negative for hallucinations, behavioral problems, confusion, dysphoric mood, decreased concentration and agitation.    Objective:   Filed Vitals:   12/09/13 1511  BP: 110/75  Pulse: 146  Temp: 97.7 F (36.5 C)  Resp: 20    Physical Exam: Constitutional: Patient appears mildly distressed, SOB, and fatigued from walking to room HENT: Normocephalic, atraumatic, External right and left ear normal. Oropharynx is clear and moist.  Eyes: Conjunctivae. PERRLA, no scleral icterus. Neck: Normal ROM. Neck supple. No JVD. No tracheal deviation. No thyromegaly. CVS: Tachycardic, S1/S2 +, no murmurs, no gallops, no carotid bruit.  Pulmonary: Effort and breath sounds normal, no stridor, rhonchi, wheezes, rales.  Abdominal: Soft. BS +,  no distension, tenderness, rebound or guarding.  Musculoskeletal: Pain with ROM Lymphadenopathy: No lymphadenopathy noted, cervical,  Neuro: Alert. Skin: Skin is warm and dry. No rash noted.  No erythema. No pallor. +Diaphoretic Psychiatric: Normal mood and affect. Behavior, judgment, thought content normal.  Lab Results  Component Value Date   WBC 6.6 11/03/2013   HGB 8.6* 11/03/2013   HCT 27.0* 11/03/2013   MCV 86.8 11/03/2013   PLT 271 11/03/2013   Lab Results  Component Value Date   CREATININE 1.08 11/02/2013   BUN 13 11/02/2013   NA 138 11/02/2013   K 4.1 11/02/2013   CL 103 11/02/2013   CO2 23 11/02/2013    Lab Results  Component Value Date   HGBA1C 9.1* 11/02/2013   Lipid Panel     Component Value Date/Time   CHOL 162 03/10/2013 1809   TRIG 129 03/10/2013 1809   HDL 34*  03/10/2013 1809   CHOLHDL 4.8 03/10/2013 1809   VLDL 26 03/10/2013 1809   LDLCALC 102* 03/10/2013 1809       Assessment and plan:   Theodore Flores was seen today for hospitalization follow-up.  Diagnoses and associated orders for this visit:  Heart rate fast - EKG 12-Lead; Standing - EKG 12-Lead Continue lovenox for PE and VTE prophylaxis.   Malignant neoplasm of scrotum - morphine (MS CONTIN) 100 MG 12 hr tablet; Take 1 tablet (100 mg total) by mouth every 12 (twelve) hours.  May continue to take Oxycodone 5-10 mg q4 hours  for break through pain control as needed.   Return in about 4 weeks (around 01/06/2014),  If you need pain medication refill or if symptoms worsen or fail to improve.       Chari Manning, NP-C [facility] [phone] 12/12/2013, 11:28 AM

## 2013-12-14 ENCOUNTER — Inpatient Hospital Stay (HOSPITAL_COMMUNITY)
Admission: EM | Admit: 2013-12-14 | Discharge: 2013-12-19 | DRG: 871 | Disposition: A | Discharge status: Hospice | Payer: 59 | Attending: Internal Medicine | Admitting: Internal Medicine

## 2013-12-14 ENCOUNTER — Emergency Department (HOSPITAL_COMMUNITY): Payer: 59

## 2013-12-14 ENCOUNTER — Encounter (HOSPITAL_COMMUNITY): Payer: Self-pay | Admitting: Emergency Medicine

## 2013-12-14 DIAGNOSIS — D638 Anemia in other chronic diseases classified elsewhere: Secondary | ICD-10-CM

## 2013-12-14 DIAGNOSIS — G893 Neoplasm related pain (acute) (chronic): Secondary | ICD-10-CM | POA: Diagnosis present

## 2013-12-14 DIAGNOSIS — Z79899 Other long term (current) drug therapy: Secondary | ICD-10-CM

## 2013-12-14 DIAGNOSIS — I129 Hypertensive chronic kidney disease with stage 1 through stage 4 chronic kidney disease, or unspecified chronic kidney disease: Secondary | ICD-10-CM | POA: Diagnosis present

## 2013-12-14 DIAGNOSIS — Z7901 Long term (current) use of anticoagulants: Secondary | ICD-10-CM

## 2013-12-14 DIAGNOSIS — E860 Dehydration: Secondary | ICD-10-CM | POA: Diagnosis present

## 2013-12-14 DIAGNOSIS — I2782 Chronic pulmonary embolism: Secondary | ICD-10-CM | POA: Diagnosis present

## 2013-12-14 DIAGNOSIS — D72829 Elevated white blood cell count, unspecified: Secondary | ICD-10-CM

## 2013-12-14 DIAGNOSIS — N179 Acute kidney failure, unspecified: Secondary | ICD-10-CM

## 2013-12-14 DIAGNOSIS — Z8249 Family history of ischemic heart disease and other diseases of the circulatory system: Secondary | ICD-10-CM

## 2013-12-14 DIAGNOSIS — C50919 Malignant neoplasm of unspecified site of unspecified female breast: Secondary | ICD-10-CM | POA: Diagnosis present

## 2013-12-14 DIAGNOSIS — A419 Sepsis, unspecified organism: Secondary | ICD-10-CM

## 2013-12-14 DIAGNOSIS — I10 Essential (primary) hypertension: Secondary | ICD-10-CM

## 2013-12-14 DIAGNOSIS — E119 Type 2 diabetes mellitus without complications: Secondary | ICD-10-CM

## 2013-12-14 DIAGNOSIS — Z66 Do not resuscitate: Secondary | ICD-10-CM | POA: Diagnosis present

## 2013-12-14 DIAGNOSIS — R509 Fever, unspecified: Secondary | ICD-10-CM | POA: Diagnosis present

## 2013-12-14 DIAGNOSIS — J189 Pneumonia, unspecified organism: Secondary | ICD-10-CM

## 2013-12-14 DIAGNOSIS — G9340 Encephalopathy, unspecified: Secondary | ICD-10-CM

## 2013-12-14 DIAGNOSIS — N19 Unspecified kidney failure: Secondary | ICD-10-CM

## 2013-12-14 DIAGNOSIS — R339 Retention of urine, unspecified: Secondary | ICD-10-CM | POA: Diagnosis present

## 2013-12-14 DIAGNOSIS — E1169 Type 2 diabetes mellitus with other specified complication: Secondary | ICD-10-CM | POA: Diagnosis present

## 2013-12-14 DIAGNOSIS — C779 Secondary and unspecified malignant neoplasm of lymph node, unspecified: Secondary | ICD-10-CM

## 2013-12-14 DIAGNOSIS — B3749 Other urogenital candidiasis: Secondary | ICD-10-CM | POA: Diagnosis present

## 2013-12-14 DIAGNOSIS — Z833 Family history of diabetes mellitus: Secondary | ICD-10-CM

## 2013-12-14 DIAGNOSIS — I2699 Other pulmonary embolism without acute cor pulmonale: Secondary | ICD-10-CM

## 2013-12-14 DIAGNOSIS — Z823 Family history of stroke: Secondary | ICD-10-CM

## 2013-12-14 DIAGNOSIS — E669 Obesity, unspecified: Secondary | ICD-10-CM

## 2013-12-14 DIAGNOSIS — Z515 Encounter for palliative care: Secondary | ICD-10-CM

## 2013-12-14 DIAGNOSIS — Z7401 Bed confinement status: Secondary | ICD-10-CM

## 2013-12-14 DIAGNOSIS — Z8719 Personal history of other diseases of the digestive system: Secondary | ICD-10-CM

## 2013-12-14 DIAGNOSIS — N189 Chronic kidney disease, unspecified: Secondary | ICD-10-CM | POA: Diagnosis present

## 2013-12-14 DIAGNOSIS — Z87891 Personal history of nicotine dependence: Secondary | ICD-10-CM

## 2013-12-14 DIAGNOSIS — D630 Anemia in neoplastic disease: Secondary | ICD-10-CM | POA: Diagnosis present

## 2013-12-14 DIAGNOSIS — R532 Functional quadriplegia: Secondary | ICD-10-CM

## 2013-12-14 DIAGNOSIS — C632 Malignant neoplasm of scrotum: Secondary | ICD-10-CM

## 2013-12-14 LAB — APTT: APTT: 64 s — AB (ref 24–37)

## 2013-12-14 LAB — CBC WITH DIFFERENTIAL/PLATELET
BASOS ABS: 0 10*3/uL (ref 0.0–0.1)
BASOS PCT: 0 % (ref 0–1)
Basophils Absolute: 0 10*3/uL (ref 0.0–0.1)
Basophils Relative: 0 % (ref 0–1)
EOS ABS: 0 10*3/uL (ref 0.0–0.7)
EOS PCT: 0 % (ref 0–5)
Eosinophils Absolute: 0 10*3/uL (ref 0.0–0.7)
Eosinophils Relative: 0 % (ref 0–5)
HCT: 23.1 % — ABNORMAL LOW (ref 39.0–52.0)
HCT: 21.7 % — ABNORMAL LOW (ref 39.0–52.0)
HEMOGLOBIN: 7.4 g/dL — AB (ref 13.0–17.0)
Hemoglobin: 6.8 g/dL — CL (ref 13.0–17.0)
Lymphocytes Relative: 8 % — ABNORMAL LOW (ref 12–46)
Lymphocytes Relative: 6 % — ABNORMAL LOW (ref 12–46)
Lymphs Abs: 1 10*3/uL (ref 0.7–4.0)
Lymphs Abs: 0.8 10*3/uL (ref 0.7–4.0)
MCH: 26.6 pg (ref 26.0–34.0)
MCH: 26.2 pg (ref 26.0–34.0)
MCHC: 32 g/dL (ref 30.0–36.0)
MCHC: 31.3 g/dL (ref 30.0–36.0)
MCV: 83.1 fL (ref 78.0–100.0)
MCV: 83.5 fL (ref 78.0–100.0)
MONOS PCT: 6 % (ref 3–12)
Monocytes Absolute: 0.8 10*3/uL (ref 0.1–1.0)
Monocytes Absolute: 0.7 10*3/uL (ref 0.1–1.0)
Monocytes Relative: 5 % (ref 3–12)
Neutro Abs: 11.2 10*3/uL — ABNORMAL HIGH (ref 1.7–7.7)
Neutro Abs: 11.2 10*3/uL — ABNORMAL HIGH (ref 1.7–7.7)
Neutrophils Relative %: 86 % — ABNORMAL HIGH (ref 43–77)
Neutrophils Relative %: 89 % — ABNORMAL HIGH (ref 43–77)
PLATELETS: 498 10*3/uL — AB (ref 150–400)
Platelets: 444 10*3/uL — ABNORMAL HIGH (ref 150–400)
RBC: 2.78 MIL/uL — AB (ref 4.22–5.81)
RBC: 2.6 MIL/uL — ABNORMAL LOW (ref 4.22–5.81)
RDW: 15.8 % — ABNORMAL HIGH (ref 11.5–15.5)
RDW: 15.9 % — AB (ref 11.5–15.5)
WBC: 13 10*3/uL — ABNORMAL HIGH (ref 4.0–10.5)
WBC: 12.6 10*3/uL — ABNORMAL HIGH (ref 4.0–10.5)

## 2013-12-14 LAB — GLUCOSE, CAPILLARY
GLUCOSE-CAPILLARY: 151 mg/dL — AB (ref 70–99)
Glucose-Capillary: 108 mg/dL — ABNORMAL HIGH (ref 70–99)

## 2013-12-14 LAB — COMPREHENSIVE METABOLIC PANEL
ALT: 29 U/L (ref 0–53)
ALT: 26 U/L (ref 0–53)
AST: 24 U/L (ref 0–37)
AST: 20 U/L (ref 0–37)
Albumin: 2 g/dL — ABNORMAL LOW (ref 3.5–5.2)
Albumin: 1.9 g/dL — ABNORMAL LOW (ref 3.5–5.2)
Alkaline Phosphatase: 56 U/L (ref 39–117)
Alkaline Phosphatase: 51 U/L (ref 39–117)
BUN: 67 mg/dL — AB (ref 6–23)
BUN: 65 mg/dL — AB (ref 6–23)
CALCIUM: 9.4 mg/dL (ref 8.4–10.5)
CALCIUM: 8.5 mg/dL (ref 8.4–10.5)
CO2: 14 mEq/L — ABNORMAL LOW (ref 19–32)
CO2: 14 meq/L — AB (ref 19–32)
CREATININE: 11.85 mg/dL — AB (ref 0.50–1.35)
Chloride: 99 mEq/L (ref 96–112)
Chloride: 102 mEq/L (ref 96–112)
Creatinine, Ser: 12.24 mg/dL — ABNORMAL HIGH (ref 0.50–1.35)
GFR calc Af Amer: 5 mL/min — ABNORMAL LOW (ref >90)
GFR calc non Af Amer: 4 mL/min — ABNORMAL LOW (ref >90)
GFR, EST AFRICAN AMERICAN: 5 mL/min — AB (ref >90)
GFR, EST NON AFRICAN AMERICAN: 4 mL/min — AB (ref >90)
GLUCOSE: 150 mg/dL — AB (ref 70–99)
Glucose, Bld: 170 mg/dL — ABNORMAL HIGH (ref 70–99)
Potassium: 4.6 mEq/L (ref 3.7–5.3)
Potassium: 4.7 mEq/L (ref 3.7–5.3)
Sodium: 141 mEq/L (ref 137–147)
Sodium: 142 mEq/L (ref 137–147)
TOTAL PROTEIN: 7.4 g/dL (ref 6.0–8.3)
Total Bilirubin: <0.2 mg/dL — ABNORMAL LOW (ref 0.3–1.2)
Total Protein: 6.7 g/dL (ref 6.0–8.3)

## 2013-12-14 LAB — MAGNESIUM: MAGNESIUM: 2.2 mg/dL (ref 1.5–2.5)

## 2013-12-14 LAB — PREPARE RBC (CROSSMATCH)

## 2013-12-14 LAB — PROTIME-INR
INR: 1.47 (ref 0.00–1.49)
INR: 1.51 — AB (ref 0.00–1.49)
PROTHROMBIN TIME: 17.4 s — AB (ref 11.6–15.2)
Prothrombin Time: 17.8 seconds — ABNORMAL HIGH (ref 11.6–15.2)

## 2013-12-14 LAB — ABO/RH: ABO/RH(D): O POS

## 2013-12-14 LAB — TSH: TSH: 0.848 u[IU]/mL (ref 0.350–4.500)

## 2013-12-14 LAB — PHOSPHORUS: PHOSPHORUS: 6.2 mg/dL — AB (ref 2.3–4.6)

## 2013-12-14 LAB — LACTIC ACID, PLASMA: LACTIC ACID, VENOUS: 4 mmol/L — AB (ref 0.5–2.2)

## 2013-12-14 MED ORDER — SENNA 8.6 MG PO TABS
1.0000 | ORAL_TABLET | Freq: Two times a day (BID) | ORAL | Status: DC
  Administered 2013-12-14 – 2013-12-17 (×4): 8.6 mg via ORAL
  Filled 2013-12-14 (×4): qty 1

## 2013-12-14 MED ORDER — ONDANSETRON HCL 4 MG/2ML IJ SOLN: 4.0000 mg | Freq: Four times a day (QID) | INTRAMUSCULAR | Status: DC | PRN

## 2013-12-14 MED ORDER — SODIUM CHLORIDE 0.9 % IV SOLN
1000.0000 mL | Freq: Once | INTRAVENOUS | Status: AC
  Administered 2013-12-14: 1000 mL via INTRAVENOUS

## 2013-12-14 MED ORDER — POLYETHYLENE GLYCOL 3350 17 G PO PACK
17.0000 g | PACK | Freq: Every day | ORAL | Status: DC
  Administered 2013-12-14: 17 g via ORAL
  Filled 2013-12-14 (×4): qty 1

## 2013-12-14 MED ORDER — MORPHINE SULFATE 4 MG/ML IJ SOLN
4.0000 mg | Freq: Once | INTRAMUSCULAR | Status: AC
Start: 2013-12-14 — End: 2013-12-14
  Administered 2013-12-14: 4 mg via INTRAVENOUS
  Filled 2013-12-14: qty 1

## 2013-12-14 MED ORDER — PANTOPRAZOLE SODIUM 40 MG PO TBEC
40.0000 mg | DELAYED_RELEASE_TABLET | Freq: Every day | ORAL | Status: DC
  Administered 2013-12-15 – 2013-12-18 (×3): 40 mg via ORAL
  Filled 2013-12-14 (×5): qty 1

## 2013-12-14 MED ORDER — METOPROLOL TARTRATE 25 MG PO TABS
25.0000 mg | ORAL_TABLET | Freq: Two times a day (BID) | ORAL | Status: DC
  Administered 2013-12-14 – 2013-12-17 (×6): 25 mg via ORAL
  Filled 2013-12-14 (×7): qty 1

## 2013-12-14 MED ORDER — DEXTROSE 5 % IV SOLN
1.0000 g | INTRAVENOUS | Status: DC
  Administered 2013-12-14 – 2013-12-18 (×5): 1 g via INTRAVENOUS
  Filled 2013-12-14 (×6): qty 10

## 2013-12-14 MED ORDER — ONDANSETRON HCL 4 MG PO TABS: 4.0000 mg | ORAL_TABLET | Freq: Four times a day (QID) | ORAL | Status: DC | PRN

## 2013-12-14 MED ORDER — SODIUM CHLORIDE 0.9 % IV SOLN
1000.0000 mL | INTRAVENOUS | Status: DC
  Administered 2013-12-14 – 2013-12-17 (×3): 1000 mL via INTRAVENOUS

## 2013-12-14 MED ORDER — SODIUM CHLORIDE 0.9 % IV SOLN: INTRAVENOUS | Status: DC

## 2013-12-14 MED ORDER — DEXTROSE 5 % IV SOLN
500.0000 mg | INTRAVENOUS | Status: DC
  Administered 2013-12-14 – 2013-12-18 (×5): 500 mg via INTRAVENOUS
  Filled 2013-12-14 (×6): qty 500

## 2013-12-14 MED ORDER — INSULIN ASPART 100 UNIT/ML ~~LOC~~ SOLN
0.0000 [IU] | Freq: Three times a day (TID) | SUBCUTANEOUS | Status: DC
  Administered 2013-12-14: 3 [IU] via SUBCUTANEOUS

## 2013-12-14 MED ORDER — MORPHINE SULFATE 2 MG/ML IJ SOLN
2.0000 mg | INTRAMUSCULAR | Status: DC | PRN
  Administered 2013-12-14: 2 mg via INTRAVENOUS
  Filled 2013-12-14: qty 1

## 2013-12-14 MED ORDER — DOCUSATE SODIUM 100 MG PO CAPS
100.0000 mg | ORAL_CAPSULE | Freq: Two times a day (BID) | ORAL | Status: DC
  Administered 2013-12-14 – 2013-12-17 (×4): 100 mg via ORAL
  Filled 2013-12-14 (×7): qty 1

## 2013-12-14 MED ORDER — VANCOMYCIN HCL IN DEXTROSE 1-5 GM/200ML-% IV SOLN
1000.0000 mg | Freq: Once | INTRAVENOUS | Status: AC
  Administered 2013-12-14: 1000 mg via INTRAVENOUS
  Filled 2013-12-14: qty 200

## 2013-12-14 MED ORDER — INSULIN ASPART 100 UNIT/ML ~~LOC~~ SOLN
0.0000 [IU] | Freq: Every day | SUBCUTANEOUS | Status: DC
Start: 2013-12-14 — End: 2013-12-15

## 2013-12-14 MED ORDER — ACETAMINOPHEN 325 MG PO TABS: 650.0000 mg | ORAL_TABLET | Freq: Four times a day (QID) | ORAL | Status: DC | PRN

## 2013-12-14 MED ORDER — ENOXAPARIN SODIUM 120 MG/0.8ML ~~LOC~~ SOLN
110.0000 mg | SUBCUTANEOUS | Status: DC
  Administered 2013-12-15 – 2013-12-19 (×4): 110 mg via SUBCUTANEOUS
  Filled 2013-12-14 (×5): qty 0.8

## 2013-12-14 MED ORDER — SODIUM CHLORIDE 0.9 % IV BOLUS (SEPSIS)
1000.0000 mL | Freq: Once | INTRAVENOUS | Status: AC
  Administered 2013-12-14: 1000 mL via INTRAVENOUS

## 2013-12-14 MED ORDER — PIPERACILLIN-TAZOBACTAM 3.375 G IVPB
3.3750 g | Freq: Once | INTRAVENOUS | Status: AC
  Administered 2013-12-14: 3.375 g via INTRAVENOUS
  Filled 2013-12-14 (×2): qty 50

## 2013-12-14 MED ORDER — MORPHINE SULFATE 2 MG/ML IJ SOLN
2.0000 mg | INTRAMUSCULAR | Status: DC | PRN
  Administered 2013-12-15 – 2013-12-19 (×16): 2 mg via INTRAVENOUS
  Filled 2013-12-14 (×17): qty 1

## 2013-12-14 MED ORDER — MORPHINE SULFATE 30 MG PO TABS
30.0000 mg | ORAL_TABLET | Freq: Four times a day (QID) | ORAL | Status: DC | PRN
Start: 2013-12-14 — End: 2013-12-15
  Administered 2013-12-15: 30 mg via ORAL
  Filled 2013-12-14: qty 1

## 2013-12-14 MED ORDER — SODIUM CHLORIDE 0.9 % IV SOLN: 1000.0000 mL | INTRAVENOUS | Status: DC

## 2013-12-14 MED ORDER — LIP MEDEX EX OINT
TOPICAL_OINTMENT | CUTANEOUS | Status: DC | PRN
  Filled 2013-12-14 (×2): qty 7

## 2013-12-14 NOTE — ED Notes (Signed)
pts foley cath bag changed . Bladder scan done shows 72ml urine Dr. Jeanell Sparrow in roomo when bladder scan preformed

## 2013-12-14 NOTE — Progress Notes (Addendum)
CRITICAL VALUE ALERT  Critical value received:  Hgb 6.8 Pt will be transfused 1 unit PRBC. Leisa Lenz

## 2013-12-14 NOTE — ED Notes (Signed)
Report given to Jamie, RN.

## 2013-12-14 NOTE — ED Notes (Signed)
Foley catheter was irrigated.  Pt is anuric, 180 cc of sterile water infused and 180 cc of sterile water returned to bag.  Dr. Jeanell Sparrow notified that urine specimen will not be possible.

## 2013-12-14 NOTE — Progress Notes (Signed)
12/14/13 1200  Clinical Encounter Type  Visited With Patient and family together (wife West Carbo, son Sherrie Sport, sister Rendy (dtr coming from [place])  Visit Type Spiritual support;Social support  Referral From Nurse  Spiritual Encounters  Spiritual Needs Prayer;Emotional;Grief support  Stress Factors  Patient Stress Factors (pain)  Family Stress Factors Loss of control;Major life changes   Provided pastoral presence and prayer at bedside with Al and his family, all of whom were grateful.  Calling on [place] and finding strength in each other are some of their primary tools for coping and making meaning.  They pray for comfort and for strength in handling whatever God wills to happen.  Family very appreciative of chaplain support and aware of ongoing chaplain availability, especially as family members rotate through room and arrive from other places.  Please page as further needs arise;  [phone].  Also consulted with Hospice RN after visit, working to serve as integrated support to family.  [place], [place]

## 2013-12-14 NOTE — ED Notes (Signed)
Bed: WA17 Expected date:  Expected time:  Means of arrival:  Comments: EMS-cancer/oliguric

## 2013-12-14 NOTE — H&P (Addendum)
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 Patient is not a good historian due to altered mental status and his family at the bedside he is provided for details of medical history. Patient also apparently did not have urine output for past couple of days. He has been needing minimally. No reports of vomiting. No diarrhea but the family does report loose stools. No blood in the stool or urine. No fevers. Also, per family patient is not ambulatory for past one week. There was no loss of consciousness. No seizure-like activity.   in ED, blood pressure is 134/71, heart rate 111, oxygen saturation 94% on room air. T max was 100.6 F. blood work revealed white blood cell count of 13, hemoglobin 7.4. Creatinine is acutely elevated at 12.24. Chest x-ray showed mild patchy airspace disease in right lung possibly due to pneumonia. Patient was given vancomycin and Zosyn on the admission.  Assessment and Plan:  Principal Problem:   Acute encephalopathy - Likely secondary to acute urinary retention, renal failure as well as possible infectious etiology, pneumonia, UTI. Considering patient's history of scrotal cancer there may also be metastatic disease in the brain but we'll defer MRI until we see if creatinine improves with IV fluids - PT evaluation when pt able to participate Active Problems:   Sepsis, UTI, Pneumonia - Patient is hemodynamically stable, does not require pressor support. We will treat with azithromycin and Rocephin for possible pneumonia and those antibiotics should  cover for a urinary tract infection as well. We will avoid Vanco and Zosyn due to potential for worsening renal function. - Lactic acid on the admission was 4. White blood cell count was 13 on the admission.   Acute renal failure / Urinary retention - Patient was on lot of nephrotoxic medications including lisinopril, metformin, Bactrim. His most recent renal function in 10/2013 was within normal limits. We will start IV fluids, 75 cc an hour. Insert Foley catheter and monitor intake and output. - Followup BMP in the morning    Cancer related pain - Patient is on multiple pain medications at home which could contribute to mental status changes. We will only continue MS Contin 30 mg every 6 hours as needed for pain as well as morphine 2 mg IV every 2 hours as needed for pain. Palliative care will meet with the family to address further goals of care.   Hypertension - Hold lisinopril secondary to renal failure. May continue metoprolol    Malignant neoplasm of scrotum - Patient is being followed by hospice   PE (pulmonary embolism) - Continue Lovenox    Diabetes - A1c in March 2015 was 9.1 indicating poor glycemic control  - Hold metformin due to renal failure  - start SSI   Anemia of chronic disease - Secondary to history of malignancy. Hemoglobin is 7.4 on the admission. Repeat CBC and transfuse if hemoglobin less than 7.    Leukocytosis - Secondary to sepsis, UTI and pneumonia    Functional quadriplegia - PT evaluation once patient able to participate    Radiological Exams on Admission: Dg Chest [facility]  12/14/2013  IMPRESSION: Mild patchy airspace disease in the right lung may be due to pneumonia.      Code Status: Full Family Communication: Family at bedside Disposition Plan: Admit for further evaluation  Robbie Lis, MD  [facility] Pager [phone]  Review of Systems:  Constitutional: Negative for fever, chills and malaise/fatigue. Negative for diaphoresis.   HENT: Negative for hearing loss, ear pain, nosebleeds, congestion, sore throat, neck pain, tinnitus and ear discharge.   Eyes: Negative for blurred vision, double vision, photophobia, pain, discharge and redness.  Respiratory: Negative for cough, hemoptysis, sputum production, shortness of breath, wheezing and stridor.   Cardiovascular: Negative for chest pain, palpitations, orthopnea, claudication and leg swelling.  Gastrointestinal: Negative for nausea, vomiting and abdominal pain. Negative for heartburn, constipation, blood in stool and melena.  Genitourinary: Negative for dysuria, urgency, frequency, hematuria and flank pain.  Musculoskeletal: Positive for inability to walk, no falls Skin: Negative for itching and rash.  Neurological: Positive for confusion and mental status changes, no tremors no sensation loss Endo/Heme/Allergies: Negative for environmental allergies and polydipsia. Does not bruise/bleed easily.  Psychiatric/Behavioral: Negative for suicidal ideas. The patient is not nervous/anxious.      Past Medical History  Diagnosis Date  . Diabetes mellitus without complication   . Hypertension   . Mass of right inguinal region   . Squamous cell carcinoma   . Adrenal adenoma   . Sigmoid diverticulitis   . Skin cancer    Past Surgical History  Procedure Laterality Date  . Wart removal      Scrotal wart removal 10+ years ago  . Cystoscopy  07/18/2012    Procedure: CYSTOSCOPY;  Surgeon: Alexis Frock, MD;  Location: [facility];  Service: Urology;  Laterality: N/A;  . Mass excision  07/18/2012    Procedure: EXCISION MASS;  Surgeon: Alexis Frock, MD;  Location: [facility];  Service: Urology;  Laterality: Right;  . Laparotomy N/A 11/22/2012    Procedure: EXPLORATORY LAPAROTOMY LADD'S PROCEDURE;  Surgeon: Rolm Bookbinder, MD;  Location: [place];  Service: General;  Laterality: N/A;  . Appendectomy N/A 11/22/2012    Procedure: APPENDECTOMY;  Surgeon: Rolm Bookbinder, MD;  Location: [place];  Service: General;  Laterality: N/A;   Social History:  reports that he has quit smoking. He has never used smokeless tobacco. He reports that he does not drink alcohol or use illicit drugs.  No Known Allergies   Family History  Problem Relation Age of Onset  . CVA Father   . Hypertension Father   . Cancer - Other Mother     Doesnot know what type  . Hypertension Mother   . Hypertension Sister   . Hypertension Brother   . Diabetes Mother   . Diabetes Father   . Diabetes Brother      Prior to Admission medications   Medication Sig Start Date End Date Taking? Authorizing Provider  acetaminophen (TYLENOL) 325 MG tablet Take 650 mg by mouth every 6 (six) hours as needed (PAIN).   Yes Historical Provider, MD  docusate sodium (COLACE) 100 MG capsule Take 100 mg by mouth 2 (two) times daily.   Yes Historical Provider, MD  enoxaparin (LOVENOX) 120 MG/0.8ML injection Inject 0.73 mLs (110 mg total) into the skin every 12 (twelve) hours. 11/02/13  Yes Donne Hazel, MD  lisinopril (PRINIVIL,ZESTRIL) 10 MG tablet Take 2 tablets (20 mg total) by mouth daily. 03/11/13  Yes Clanford Marisa Hua, MD  metFORMIN (GLUCOPHAGE) 1000 MG tablet Take 1,000 mg by mouth  2 (two) times daily with a meal.   Yes Historical Provider, MD  metoprolol tartrate (LOPRESSOR) 25 MG tablet Take 1 tablet (25 mg total) by mouth 2 (two) times daily. 11/15/13  Yes Angelica Chessman, MD  morphine (MS CONTIN) 30 MG 12 hr tablet Take 30 mg by mouth every 12 (twelve) hours.   Yes Historical Provider, MD  morphine (MSIR) 30 MG tablet Take 30 mg by mouth every 6 (six) hours as needed for moderate pain.    Yes Historical Provider, MD  MORPHINE SULFATE ER PO Take 60 mg by mouth every 12 (twelve) hours. Take 2 tablets every 12 hours   Yes Historical Provider, MD  Oxycodone HCl 10 MG TABS Take 10-25 mg by mouth every 4 (four) hours as needed (pain). Take 1 to 1-1/2 tablets every 4 hours as needed for pain   Yes Historical Provider, MD   oxyCODONE-acetaminophen (PERCOCET) 5-325 MG per tablet Take 1-2 tablets by mouth every 4 (four) hours as needed for severe pain.   Yes Historical Provider, MD  pantoprazole (PROTONIX) 40 MG tablet Take 1 tablet (40 mg total) by mouth daily. 11/15/13  Yes Angelica Chessman, MD  polyethylene glycol (MIRALAX / GLYCOLAX) packet Take 17 g by mouth daily.   Yes Historical Provider, MD  senna (SENOKOT) 8.6 MG TABS Take 1 tablet by mouth 2 (two) times daily.   Yes Historical Provider, MD  sulfamethoxazole-trimethoprim (BACTRIM DS) 800-160 MG per tablet Take 1 tablet by mouth 3 (three) times daily.   Yes Historical Provider, MD   Physical Exam: Filed Vitals:   12/14/13 1001 12/14/13 1037 12/14/13 1242 12/14/13 1357  BP: 145/69  134/71 140/69  Pulse: 111  122   Temp: 98.4 F (36.9 C) 100.6 F (38.1 C)    TempSrc: Oral Oral    Resp: 20  20 16   SpO2: 95%  94% 96%    Physical Exam  Constitutional: Appears well-developed and well-nourished. Confused. HENT: Normocephalic. External right and left ear normal.  Eyes: Conjunctivae and EOM are normal. PERRLA, no scleral icterus.  Neck: Normal ROM. Neck supple. No JVD. No tracheal deviation. CVS: tachycardic, S1/S2 +, no murmurs, no gallops, no carotid bruit.  Pulmonary: Effort and breath sounds normal, no stridor, rhonchi, wheezes, rales.  Abdominal: Soft. BS +,  no distension, tenderness, rebound or guarding.  Musculoskeletal: Normal range of motion; trace LE edema and tenderness to palpation over LE  Lymphadenopathy: No lymphadenopathy noted, cervical, inguinal. Neuro: Alert. No focal neurologic deficits Skin: Skin is warm and dry. No rash noted.  Psychiatric: Normal mood and affect.   Labs on Admission:  Basic Metabolic Panel:  Recent Labs Lab 12/14/13 1047  NA 141  K 4.6  CL 99  CO2 14*  GLUCOSE 150*  BUN 67*  CREATININE 12.24*  CALCIUM 9.4   Liver Function Tests:  Recent Labs Lab 12/14/13 1047  AST 24  ALT 29  ALKPHOS 56   BILITOT <0.2*  PROT 7.4  ALBUMIN 2.0*   No results found for this basename: LIPASE, AMYLASE,  in the last 168 hours No results found for this basename: AMMONIA,  in the last 168 hours CBC:  Recent Labs Lab 12/14/13 1047  WBC 13.0*  NEUTROABS 11.2*  HGB 7.4*  HCT 23.1*  MCV 83.1  PLT 498*   Cardiac Enzymes: No results found for this basename: CKTOTAL, CKMB, CKMBINDEX, TROPONINI,  in the last 168 hours BNP: No components found with this basename: POCBNP,  CBG: No results found for this basename:  GLUCAP,  in the last 168 hours  If 7PM-7AM, please contact night-coverage [email or web address] Password [facility] 12/14/2013, 2:04 PM

## 2013-12-14 NOTE — ED Notes (Signed)
Per EMS: Pt from home.  Pt w/ hx of cancer, acute kidney failure, heart failure.  Urine output has been about 50 cc over the last 3 days.  Pt has a picc line and foley already.  End stage cancer.  In a lot of pain in groin.

## 2013-12-14 NOTE — ED Provider Notes (Signed)
CSN: [id number]     Arrival date & time 12/14/13  0867 History   First MD Initiated Contact with Patient 12/14/13 1002     Chief Complaint  Patient presents with  . Cancer  . 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 He presents today with reports that his creatinine is elevated at 7 and he has not made urine for several days. He is had increased confusion and pain. He was unable to give me any history on his own initially and history was obtained from records. I returned to obtained more history from the hospice care physician Dr. Antonieta Loveless. After my initial valuation the family arrived and I have spoken in detail with them. Past Medical History  Diagnosis Date  . Diabetes mellitus without complication   . Hypertension   . Mass of right inguinal region   . Squamous cell carcinoma   . Adrenal adenoma   . Sigmoid diverticulitis   . Skin cancer    Past Surgical History  Procedure Laterality Date  . Wart removal      Scrotal wart removal 10+ years ago  . Cystoscopy  07/18/2012    Procedure: CYSTOSCOPY;  Surgeon: Alexis Frock, MD;  Location: [facility];  Service: Urology;  Laterality: N/A;  . Mass excision  07/18/2012    Procedure: EXCISION MASS;  Surgeon: Alexis Frock, MD;  Location: [facility];  Service: Urology;  Laterality: Right;  . Laparotomy N/A 11/22/2012    Procedure: EXPLORATORY LAPAROTOMY LADD'S PROCEDURE;  Surgeon: Rolm Bookbinder, MD;  Location: [place];  Service: General;  Laterality: N/A;  . Appendectomy N/A 11/22/2012    Procedure: APPENDECTOMY;  Surgeon: Rolm Bookbinder, MD;  Location: [facility] OR;  Service: General;  Laterality: N/A;   Family History  Problem Relation Age of Onset  . CVA Father   . Hypertension Father   .  Cancer - Other Mother     Doesnot know what type  . Hypertension Mother   . Hypertension Sister   . Hypertension Brother   . Diabetes Mother   . Diabetes Father   . Diabetes Brother    History  Substance Use Topics  . Smoking status: Former Smoker -- 0.25 packs/day for 40 years  . Smokeless tobacco: Never Used  . Alcohol Use: No     Comment: Social    Review of Systems  Unable to perform ROS     Allergies  Review of patient's allergies indicates no known allergies.  Home Medications   Prior to Admission medications   Medication Sig Start Date End Date Taking? Authorizing Provider  acetaminophen (TYLENOL) 325 MG tablet Take 650 mg by mouth every 6 (six) hours as needed (PAIN).   Yes Historical Provider, MD  docusate sodium (COLACE) 100 MG capsule Take 100 mg by mouth 2 (two) times daily.   Yes Historical Provider, MD  enoxaparin (LOVENOX) 120 MG/0.8ML injection Inject 0.73 mLs (110 mg total) into the skin every 12 (twelve) hours. 11/02/13  Yes Donne Hazel, MD  lisinopril (PRINIVIL,ZESTRIL) 10 MG tablet Take 2 tablets (20 mg total) by mouth daily. 03/11/13  Yes Clanford Marisa Hua, MD  metFORMIN (GLUCOPHAGE) 1000 MG tablet Take 1,000 mg by mouth 2 (two) times daily with a meal.   Yes Historical Provider, MD  metoprolol tartrate (LOPRESSOR) 25 MG tablet Take  1 tablet (25 mg total) by mouth 2 (two) times daily. 11/15/13  Yes Angelica Chessman, MD  morphine (MS CONTIN) 30 MG 12 hr tablet Take 30 mg by mouth every 12 (twelve) hours.   Yes Historical Provider, MD  morphine (MSIR) 30 MG tablet Take 30 mg by mouth every 6 (six) hours as needed for moderate pain.    Yes Historical Provider, MD  MORPHINE SULFATE ER PO Take 60 mg by mouth every 12 (twelve) hours. Take 2 tablets every 12 hours   Yes Historical Provider, MD  Oxycodone HCl 10 MG TABS Take 10-25 mg by mouth every 4 (four) hours as needed (pain). Take 1 to 1-1/2 tablets every 4 hours as needed for pain   Yes Historical Provider,  MD  oxyCODONE-acetaminophen (PERCOCET) 5-325 MG per tablet Take 1-2 tablets by mouth every 4 (four) hours as needed for severe pain.   Yes Historical Provider, MD  pantoprazole (PROTONIX) 40 MG tablet Take 1 tablet (40 mg total) by mouth daily. 11/15/13  Yes Angelica Chessman, MD  polyethylene glycol (MIRALAX / GLYCOLAX) packet Take 17 g by mouth daily.   Yes Historical Provider, MD  senna (SENOKOT) 8.6 MG TABS Take 1 tablet by mouth 2 (two) times daily.   Yes Historical Provider, MD  sulfamethoxazole-trimethoprim (BACTRIM DS) 800-160 MG per tablet Take 1 tablet by mouth 3 (three) times daily.   Yes Historical Provider, MD   BP 145/69  Pulse 111  Temp(Src) 100.6 F (38.1 C) (Oral)  Resp 20  SpO2 95% Physical Exam  Nursing note and vitals reviewed. Constitutional: He appears well-developed. He appears distressed.  HENT:  Head: Normocephalic.  Right Ear: External ear normal.  Left Ear: External ear normal.  Mucous membranes are dry  Eyes: Conjunctivae and EOM are normal.  Neck: Normal range of motion. Neck supple.  Cardiovascular: Tachycardia present.   Pulmonary/Chest:  Decreased breath sounds throughout but no crackles or rales are noted  Abdominal:  Abdomen appears firm and slightly diffusely tender to palpation  Genitourinary:  Excoriated area of the perineum is noted. Patient has stool present and a Foley catheter in place drain is also going into the perineal area  Musculoskeletal: He exhibits edema.  Neurological:  Patient is alert but confused. He appears to move all 4 extremities normally    ED Course  Procedures (including critical care time) Labs Review Labs Reviewed  CBC WITH DIFFERENTIAL - Abnormal; Notable for the following:    WBC 13.0 (*)    RBC 2.78 (*)    Hemoglobin 7.4 (*)    HCT 23.1 (*)    RDW 15.8 (*)    Platelets 498 (*)    Neutrophils Relative % 86 (*)    Neutro Abs 11.2 (*)    Lymphocytes Relative 8 (*)    All other components within normal  limits  COMPREHENSIVE METABOLIC PANEL - Abnormal; Notable for the following:    CO2 14 (*)    Glucose, Bld 150 (*)    BUN 67 (*)    Creatinine, Ser 12.24 (*)    Albumin 2.0 (*)    Total Bilirubin <0.2 (*)    GFR calc non Af Amer 4 (*)    GFR calc Af Amer 5 (*)    All other components within normal limits  PROTIME-INR - Abnormal; Notable for the following:    Prothrombin Time 17.4 (*)    All other components within normal limits  LACTIC ACID, PLASMA - Abnormal; Notable for the following:  Lactic Acid, Venous 4.0 (*)    All other components within normal limits  CULTURE, BLOOD (ROUTINE X 2)  CULTURE, BLOOD (ROUTINE X 2)  URINALYSIS, ROUTINE W REFLEX MICROSCOPIC  OCCULT BLOOD X 1 CARD TO LAB, STOOL    Imaging Review Dg Chest Port 1 View  12/14/2013   CLINICAL DATA:  Fever and pain. End-stage metastatic scrotal cancer.  EXAM: PORTABLE CHEST - 1 VIEW  COMPARISON:  CT chest and chest radiograph 11/01/2013.  FINDINGS: Trachea is midline. Heart is at the upper limits of normal in size and accentuated by low lung volumes and AP semi upright technique. Right PICC tip projects over the SVC. Mild patchy opacities in the right lung. Left lung is grossly clear. No definite pleural fluid.  IMPRESSION: Mild patchy airspace disease in the right lung may be due to pneumonia.   Electronically Signed   By: Lorin Picket M.D.   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 Likely site of infection are cellulitis in the paranasal area, peritoneal fluid infection, or pneumonia. He is also a new renal failure with a creatinine of 12.24. I discussed the patient's care with his hospice Dr., Dr. Antonieta Loveless. I have in had in-depth conversation with the wife and children regarding palliative care. They voice understanding of  likely  futility of dialysis or more aggressive treatment. He is, however, being hydrated and IV antibiotics are being given here in the emergency department. You do other having ongoing conversation and dialyze regarding history. I have discussed his care with Dr. Charlies Silvers and he will be admitted to a MedSurg bed.    Shaune Pollack, MD 12/14/13 1257

## 2013-12-14 NOTE — Progress Notes (Signed)
ED RN visit-  [facility] ED Room 17-[facility] RN Visit-Karen Alford Highland RN  Pt seen in ED lying on stretcher grimacing with c/o pain. Staff RN addressing. Foley catheter flushed w/no urine returned,  bladder scan also negative for urine. Pt's wife and son present and spoke with Dr. Jeanell Sparrow ED MD,family questioned regarding interventions desired. Pt's wife West Carbo, did express understanding that pt's oncologist had advised that there "was nothing left to do". Pearl also stated that she wanted what ever would "make him better". Writer and Dr. Jeanell Sparrow addressed this with her, explained that "better" could mean symptom management as no cure possible. Pt is currently a Full Code.  Discussed home medications with Staff RN Maudie Mercury, medications brought in from home, she needed clarification on when  Lovenox and metoprolol were last given. Wife stated both were given this am prior to ED admission. Epic Note filed from 4/27,( visit date 4/24) states that MS Contin was increased from 60mg  bid to 100mg  bid. Pt's wife stated that she had not had the rx for the MS contin 100mg  filled, still using 60mg  dose. Staff RN Maudie Mercury. Writer requested that Dr Jeanell Sparrow contact Hospice Md Dr. Cherie Ouch [phone]) regarding patient status/interventions discussed. Emotional support given to pt and family. Hospital chaplain visit offered and accepted, Staff RN Anderson Malta to request. Also discussed possible PMT consult for goals of care.  [facility] will continue to follow at this time.  Patient's home medication list placed with ER paperwork for shadow chart.  Please call [facility] @ [phone]-with any hospice needs.   Thank you. Tracey Harries, RN  [facility]  Hospice Liaison  [phone])

## 2013-12-15 DIAGNOSIS — Z515 Encounter for palliative care: Secondary | ICD-10-CM

## 2013-12-15 DIAGNOSIS — N19 Unspecified kidney failure: Secondary | ICD-10-CM

## 2013-12-15 DIAGNOSIS — C632 Malignant neoplasm of scrotum: Secondary | ICD-10-CM

## 2013-12-15 DIAGNOSIS — D638 Anemia in other chronic diseases classified elsewhere: Secondary | ICD-10-CM

## 2013-12-15 LAB — URINE MICROSCOPIC-ADD ON

## 2013-12-15 LAB — COMPREHENSIVE METABOLIC PANEL
ALT: 25 U/L (ref 0–53)
AST: 18 U/L (ref 0–37)
Albumin: 1.9 g/dL — ABNORMAL LOW (ref 3.5–5.2)
Alkaline Phosphatase: 49 U/L (ref 39–117)
BUN: 70 mg/dL — AB (ref 6–23)
CALCIUM: 8.4 mg/dL (ref 8.4–10.5)
CO2: 14 meq/L — AB (ref 19–32)
CREATININE: 12.95 mg/dL — AB (ref 0.50–1.35)
Chloride: 104 mEq/L (ref 96–112)
GFR calc Af Amer: 4 mL/min — ABNORMAL LOW (ref >90)
GFR calc non Af Amer: 4 mL/min — ABNORMAL LOW (ref >90)
Glucose, Bld: 127 mg/dL — ABNORMAL HIGH (ref 70–99)
Potassium: 4.8 mEq/L (ref 3.7–5.3)
SODIUM: 142 meq/L (ref 137–147)
TOTAL PROTEIN: 6.5 g/dL (ref 6.0–8.3)
Total Bilirubin: <0.2 mg/dL — ABNORMAL LOW (ref 0.3–1.2)

## 2013-12-15 LAB — URINALYSIS, ROUTINE W REFLEX MICROSCOPIC
BILIRUBIN URINE: NEGATIVE
Glucose, UA: NEGATIVE mg/dL
Ketones, ur: NEGATIVE mg/dL
NITRITE: NEGATIVE
Protein, ur: >300 mg/dL — AB
SPECIFIC GRAVITY, URINE: 1.025 (ref 1.005–1.030)
Urobilinogen, UA: 0.2 mg/dL (ref 0.0–1.0)
pH: 6 (ref 5.0–8.0)

## 2013-12-15 LAB — CBC
HEMATOCRIT: 24.2 % — AB (ref 39.0–52.0)
HEMOGLOBIN: 7.6 g/dL — AB (ref 13.0–17.0)
MCH: 26.3 pg (ref 26.0–34.0)
MCHC: 31.4 g/dL (ref 30.0–36.0)
MCV: 83.7 fL (ref 78.0–100.0)
Platelets: 429 10*3/uL — ABNORMAL HIGH (ref 150–400)
RBC: 2.89 MIL/uL — AB (ref 4.22–5.81)
RDW: 15.7 % — ABNORMAL HIGH (ref 11.5–15.5)
WBC: 13 10*3/uL — AB (ref 4.0–10.5)

## 2013-12-15 LAB — GLUCOSE, CAPILLARY
GLUCOSE-CAPILLARY: 129 mg/dL — AB (ref 70–99)
Glucose-Capillary: 126 mg/dL — ABNORMAL HIGH (ref 70–99)
Glucose-Capillary: 117 mg/dL — ABNORMAL HIGH (ref 70–99)
Glucose-Capillary: 134 mg/dL — ABNORMAL HIGH (ref 70–99)

## 2013-12-15 LAB — TYPE AND SCREEN
ABO/RH(D): O POS
ANTIBODY SCREEN: NEGATIVE
Unit division: 0

## 2013-12-15 LAB — OCCULT BLOOD X 1 CARD TO LAB, STOOL: Fecal Occult Bld: POSITIVE — AB

## 2013-12-15 MED ORDER — VITAMINS A & D EX OINT
TOPICAL_OINTMENT | CUTANEOUS | Status: AC
  Administered 2013-12-15: 02:00:00
  Filled 2013-12-15: qty 5

## 2013-12-15 MED ORDER — MORPHINE SULFATE 15 MG PO TABS
15.0000 mg | ORAL_TABLET | Freq: Four times a day (QID) | ORAL | Status: DC | PRN
  Administered 2013-12-15: 15 mg via ORAL
  Filled 2013-12-15 (×2): qty 1

## 2013-12-15 NOTE — Care Management Note (Signed)
CARE MANAGEMENT NOTE 12/15/2013  Patient:  Theodore Flores, Theodore Flores   Account Number:  [id number]  Date Initiated:  12/15/2013  Documentation initiated by:  Naythan Douthit  Subjective/Objective Assessment:   58 yo male admitted with acute encephalopathy & sepsis.     Action/Plan:   Home with Hospice vs residential hospice   Anticipated DC Date:     Anticipated DC Plan:  [place]  CM consult      Choice offered to / List presented to:  NA   DME arranged  NA      DME agency  NA     [place] arranged  NA      [place] agency  NA   Status of service:  In process, will continue to follow Medicare Important Message given?   (If response is "NO", the following Medicare IM given date fields will be blank) Date Medicare IM given:   Date Additional Medicare IM given:    Discharge Disposition:    Per UR Regulation:  Reviewed for med. necessity/level of care/duration of stay  If discussed at [place] of Stay Meetings, dates discussed:    Comments:  12/15/13 [place] [phone] Chart reviewed for utilization of services. PTA pt from home with hospice. Will continue to follow.

## 2013-12-15 NOTE — Progress Notes (Signed)
Physical Therapy Discharge Patient Details Name: Theodore Flores MRN: [id number] DOB: Jun 10, 1956 Today's Date: 12/15/2013 Time:  -     Patient discharged from PT services secondary to medical decline - will need to re-order PT to resume therapy services.Palliative Care noted. PT is not indicated at this time.   .      GP     [place] [facility] [phone]  12/15/2013, 10:11 AM

## 2013-12-15 NOTE — Consult Note (Signed)
Patient ID:[id number] Theodore Flores      DOB: 01-13-1956      UOH:[id number]  Met with Patient and his wife Theodore Flores and son Theodore Flores.  Other children to arrive later today.    Pearl updated me with her knowledge that his kidneys have failed .  We started to touch on some goals of care like should we continue to check labs and give blood and she was more focused on where to take care of him.  She reports that she was told he needs to go to a nursing home.  She then broke down and cried hearing as if for the first time that his condition is declining and he is getting close to his time of death.   Her other children are coming later today and so we agreed to get together then to give them the opportunity to talk together about further goals.  [name] is relatively comfortable and happy. His is confused and I note that he is having word finding difficulty and what looks like CNS- concerned that he is not seeing and he appears to have involuntary movements of his torso , with some facial symptoms.  Spouse going home to shower ok with getting together with me later today when other children arrive.   Pain seems controlled. Continue with current recommendations- not the best with renal failure but working.  DNR in place.  Emotional support offered.  Patient's sister just went through this with her spouse.  Hospice team supporting as well. Mentioned maybe using [facility] for continued care since [name] is full care at this time.  Will addend note after meeting later.   805 am- 850 pm  [name] L. Lovena Le, MD MBA The Palliative Medicine Team at [facility] Phone: [phone] Pager: [phone]

## 2013-12-15 NOTE — Progress Notes (Addendum)
[facility] PROGRESS NOTE  Theodore Flores XVQ:[id number] DOB: 1956/02/03 DOA: 12/14/2013 PCP: Chari Manning, NP  Brief narrative: 58 year old male with past medical history of squamous cell scrotal carcinoma, being followed by hospice who presented to [facility] ED 12/14/2013 with reports of worsening mental status changes, confusion for past 3 days prior to this admission.  In ED, blood pressure is 134/71, heart rate 111, oxygen saturation 94% on room air. T max was 100.6 F. blood work revealed white blood cell count of 13, hemoglobin 7.4. Creatinine is acutely elevated at 12.24. Chest x-ray showed mild patchy airspace disease in right lung possibly due to pneumonia. Patient was given vancomycin and Zosyn on the admission. Due to renal failure with switched to azithromycin and rocephin for treatment of pneumonia.  Assessment and Plan:   Principal Problem:  Acute encephalopathy  - Likely secondary to acute urinary retention, renal failure as well as possible infectious etiology, pneumonia, UTI. Considering patient's history of scrotal cancer there may also be metastatic disease in the brain.  - palliative care assisting in addressing the goals of care - PT evaluation when pt able to participate  Active Problems:  Sepsis, UTI, Pneumonia  - Patient is hemodynamically stable, does not require pressor support. Continue Azithromycin and Rocephin for possible pneumonia and / or UTI Acute renal failure / Urinary retention  - Patient was on lot of nephrotoxic medications including lisinopril, metformin, Bactrim. Continue IV fluids and monitor BMP Cancer related pain  - Patient is on multiple pain medications at home which could contribute to mental status changes. We will only continue MS Contin 30 mg every 6 hours as needed for pain as well as morphine 2 mg IV every 2 hours as needed for pain. Patient's family reported his pain is relatively controlled. Hypertension  - Hold lisinopril secondary to  renal failure. May continue metoprolol  Malignant neoplasm of scrotum  - Patient is being followed by hospice  PE (pulmonary embolism)  - Continue Lovenox for now; may need to be switched to heparin if renal function does not improve by tomorrow  Diabetes  - A1c in March 2015 was 9.1 indicating poor glycemic control  - Hold metformin due to renal failure  - Continue SSI - CBG's in past 24 hours: 126, 117, 134 Anemia of chronic disease  - Secondary to history of malignancy. Hemoglobin was 7.4 on the admission. Repeat CBC showed hgb of 6.8; pt transfused 1 unit PRBC and post transfusion hemoglobin is 7.6 Leukocytosis  - Secondary to sepsis, UTI and pneumonia  Functional quadriplegia  - PT evaluation once patient able to participate   Code Status: DNR/DNI Family Communication: Family at bedside    Robbie Lis, MD  [facility] Pager [phone]  If 7PM-7AM, please contact night-coverage [email or web address] Password [email or web address] 12/15/2013, 5:24 PM   LOS: 1 day   Consultants:  Palliative care  Procedures:  None   Antibiotics:  Azithromycin 12/14/2013 -->  Rocephin 12/14/2013 -->  HPI/Subjective: No acute overnight events.   Objective: Filed Vitals:   12/14/13 2330 12/15/13 0450 12/15/13 1134 12/15/13 1500  BP: 105/46 113/50 132/68 119/60  Pulse: 115 110 120 111  Temp: 98.7 F (37.1 C) 99 F (37.2 C)  99.3 F (37.4 C)  TempSrc:  Oral  Oral  Resp: 16 18  16   Height:      Weight:      SpO2: 98% 98% 96% 95%    Intake/Output Summary (Last 24 hours) at 12/15/13 1724 Last data filed at  12/15/13 0610  Gross per 24 hour  Intake   2035 ml  Output     37 ml  Net   1998 ml    Exam:   General:  Pt is alert, still confused and does not recognize family members  Cardiovascular: Regular rate and rhythm, S1/S2, no murmurs, no rubs, no gallops  Respiratory: Clear to auscultation bilaterally, no wheezing, no crackles, no rhonchi  Abdomen: Soft, non tender, non  distended, bowel sounds present, no guarding  Extremities: pulses DP and PT palpable bilaterally  Neuro: Grossly nonfocal  Data Reviewed: Basic Metabolic Panel:  Recent Labs Lab 12/14/13 1047 12/14/13 1540 12/15/13 0528  NA 141 142 142  K 4.6 4.7 4.8  CL 99 102 104  CO2 14* 14* 14*  GLUCOSE 150* 170* 127*  BUN 67* 65* 70*  CREATININE 12.24* 11.85* 12.95*  CALCIUM 9.4 8.5 8.4  MG  --  2.2  --   PHOS  --  6.2*  --    Liver Function Tests:  Recent Labs Lab 12/14/13 1047 12/14/13 1540 12/15/13 0528  AST 24 20 18   ALT 29 26 25   ALKPHOS 56 51 49  BILITOT <0.2* <0.2* <0.2*  PROT 7.4 6.7 6.5  ALBUMIN 2.0* 1.9* 1.9*   No results found for this basename: LIPASE, AMYLASE,  in the last 168 hours No results found for this basename: AMMONIA,  in the last 168 hours CBC:  Recent Labs Lab 12/14/13 1047 12/14/13 1540 12/15/13 0528  WBC 13.0* 12.6* 13.0*  NEUTROABS 11.2* 11.2*  --   HGB 7.4* 6.8* 7.6*  HCT 23.1* 21.7* 24.2*  MCV 83.1 83.5 83.7  PLT 498* 444* 429*   Cardiac Enzymes: No results found for this basename: CKTOTAL, CKMB, CKMBINDEX, TROPONINI,  in the last 168 hours BNP: No components found with this basename: POCBNP,  CBG:  Recent Labs Lab 12/14/13 1654 12/14/13 2106 12/15/13 0839 12/15/13 1159 12/15/13 1711  GLUCAP 151* 108* 126* 117* 134*    Recent Results (from the past 240 hour(s))  CULTURE, BLOOD (ROUTINE X 2)     Status: None   Collection Time    12/14/13 10:47 AM      Result Value Ref Range Status   Specimen Description BLOOD LEFT HAND   Final   Special Requests BOTTLES DRAWN AEROBIC AND ANAEROBIC 5 CC EACH   Final   Culture  Setup Time     Final   Value: 12/14/2013 14:59     Performed at [facility]   Culture     Final   Value:        BLOOD CULTURE RECEIVED NO GROWTH TO DATE CULTURE WILL BE HELD FOR 5 DAYS BEFORE ISSUING A FINAL NEGATIVE REPORT     Performed at [facility]   Report Status PENDING   Incomplete   CULTURE, BLOOD (ROUTINE X 2)     Status: None   Collection Time    12/14/13 10:51 AM      Result Value Ref Range Status   Specimen Description BLOOD LEFT ARM   Final   Special Requests BOTTLES DRAWN AEROBIC AND ANAEROBIC 5 CC EACH   Final   Culture  Setup Time     Final   Value: 12/14/2013 14:58     Performed at [facility]   Culture     Final   Value:        BLOOD CULTURE RECEIVED NO GROWTH TO DATE CULTURE WILL BE HELD FOR 5 DAYS  BEFORE ISSUING A FINAL NEGATIVE REPORT     Performed at [facility]   Report Status PENDING   Incomplete     Studies: Dg Chest Port 1 View 12/14/2013    IMPRESSION: Mild patchy airspace disease in the right lung may be due to pneumonia.      Scheduled Meds: . azithromycin  500 mg Intravenous Q24H  . cefTRIAXone   1 g Intravenous Q24H  . docusate sodium  100 mg Oral BID  . enoxaparin  110 mg Subcutaneous Q24H  . insulin aspart  0-15 Units Subcutaneous TID WC  . insulin aspart  0-5 Units Subcutaneous QHS  . metoprolol tartrate  25 mg Oral BID  . pantoprazole  40 mg Oral Daily  . polyethylene glycol  17 g Oral Daily  . senna  1 tablet Oral BID   Continuous Infusions: . sodium chloride 1,000 mL (12/15/13 0600)

## 2013-12-15 NOTE — Progress Notes (Signed)
Was a little Urine (?) from foley. All shift long nothing had flowed in to drainage bag or tubing. Now a small amount of yellow clear fluid in tubing. Aspirated only 89ml total. Did send for ua and culture.

## 2013-12-15 NOTE — Progress Notes (Signed)
Visit at the suggestion of the nursing staff. Mr Longsworth wished to talk but not in front of his family. From what was said he appears deeply distressed at his situation and angry at not being healed. It is likely that a chaplain visit when only wife West Carbo is present may allow Mr Cortese the ability to speak more freely. A requested prayer for comfort and God's assistance was prayed.   Mr Butler and his family are practicing Christians, and their decisions are informed by their faith.  Strongly recommend a follow up visit by chaplains early Friday, May 1.  Sallee Lange. Lilinoe Acklin, [place]

## 2013-12-15 NOTE — Progress Notes (Signed)
Inpatient RN visit- [place] [facility] RN Visit-Karen Alford Highland RN  Related non covered admission to [facility] diagnosis of Malignant Neoplasm of scrotum.  Pt is now DNR code.   Pt alert & interactive lying in bed pleasantly confused.  Sister-in-law present feeding him breakfast. Pt swallowing liquids with out difficulty, could not coordinate straw and needed cup held for him, also much queing needed from his sister in law. Pt appears comfortable. Some involuntary arm and head movements noted during visit. Emotional support offered. [facility] will continue to follow.  Patient's home medication list and transfer summary in place on shadow chart.   Please call [facility] @ [phone]-with any hospice needs.   Thank you. Tracey Harries, RN  [facility]  Hospice Liaison  [phone])

## 2013-12-15 NOTE — Progress Notes (Signed)
[facility] [place] Work note Patient is currently receiving hospice care, yet [place] fairly new admit and has been under care for a week. He lives at home with his wife. He has continued to experience a decline at home, yet had been a full code. DNR status has been established now. LCSW attempted visit this am to assess needs and offer support, yet wife had exited the room and returned home. Patient sister-in-law was present and was offering support to patient. He appeared comfortable, yet moving about. LCSW met with nsg staff who report multiple family in the room last night and that patient appears to have good support system in place. LCSW to f/u with wife to offer support. Katherina Right, [place]

## 2013-12-15 NOTE — Progress Notes (Signed)
Patient Theodore Flores      DOB: 1955/12/19      BEM:[id number]  Revisited family this afternoon.  All children have arrived to support their parents.  Theodore Flores has be comfortable most of the day. Theodore Flores states, they are ok now.  She reports that the children don't have any questions and they want to focus on Theodore Flores's comfort.  No further labs are necessary, just treat symptoms.  Will also stop monitoring CBG.  Keep abx for now.  Patient has declined even over the course of the day. I suspect he may have a hospital death.  500-530 pm Naraly Fritcher L. Lovena Le, MD MBA The Palliative Medicine Team at [facility] Phone: [phone] Pager: [phone]

## 2013-12-16 LAB — URINE CULTURE: Colony Count: >=100000

## 2013-12-16 MED ORDER — LORAZEPAM 2 MG/ML IJ SOLN
INTRAMUSCULAR | Status: AC
  Filled 2013-12-16: qty 1

## 2013-12-16 MED ORDER — LORAZEPAM 2 MG/ML IJ SOLN
0.5000 mg | Freq: Once | INTRAMUSCULAR | Status: AC
  Administered 2013-12-16: 0.5 mg via INTRAVENOUS
  Filled 2013-12-16: qty 1

## 2013-12-16 MED ORDER — LORAZEPAM 2 MG/ML IJ SOLN
0.5000 mg | Freq: Once | INTRAMUSCULAR | Status: AC
  Administered 2013-12-16: 0.5 mg via INTRAVENOUS

## 2013-12-16 NOTE — Progress Notes (Signed)
Night Chaplain follow-up to visit from yesterday. Mr Theodore Flores surrounded by his family. He was restless and family indicates he has started sleeping more.

## 2013-12-16 NOTE — Consult Note (Addendum)
Patient [id number] GARRY NICOLINI      DOB: Jan 02, 1956      GYI:[id number]     Consult Note from the Palliative Medicine Team at [place] Requested by: Dr. 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  Patient has been on hospice for one week. He presents to ED with altered mental status, no urine output and generalized weakness.  His wife reports he has been soiling himself at home.  Patient found to have elevated creatinine and involuntary movements of limbs and torso. Patient was started on Vanc and Zosyn for possible pneumonia and a catheter was placed.  Nephrotoxic medications were discontinued but he remains not putting out any urine.  I spoke with his wife about the implications of what we are seeing and let her know that I was concerned that we may be approaching the end of his life.  She broke down.  Please see my note from the original visit.  I returned this evening to affirm goals as her children were all going to be arriving later in the day.  On my return the patient had remained confused but comfortable.  His spouse stated they all knew what they needed to know and did not want to talk further.  They just wanted to focus on his comfort.  She gave me permission to stop lab draws and we had talked earlier about considering using [facility] to care for him.  Abx were to remain in place for now.   Goals of Care: 1.  Code Status: DNR   2. Scope of Treatment:  Stop unnecessary lab draws continue abx for now but understanding if he transfers they would not be continued. Consider [employer] transition.  Prognosis likely hours to days.   4. Disposition: consider [place]   3. Symptom Management:   1. Anxiety/Agitation: patient is not suffering agitation but he does have some spontaneous  movements which seem to make him look agitated, I am supicious for CNS mets.  No need to CT at this time.  Can use Ativan if they become disturbing or harmful to the patient, or if seizure activity suspected. 2. Pain: continue long acting and short acting morphine.  Metabolites will build up regardless and right now his pain is controlled. 3. Bowel Regimen: monitor or senna 4. Delirium/ encephalopathy: suspect multifactorial related to infection and worrisome for brain mets. CT of head will not change or treatment or outcome. 5. Comfort feeding. 6. Terminal Secretions: add scopolamine patch if needed  4. Psychosocial: married with two sons and a duaghter  5. Spiritual: believers who are being supported by hospice chaplain.        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  Asked to assess goals and help with symptoms.   ROS: Confused, but denies pain   PMH:  Past Medical History  Diagnosis Date  . Diabetes mellitus without complication   . Hypertension   . Mass of right inguinal region   . Squamous cell carcinoma   .  Adrenal adenoma   . Sigmoid diverticulitis   . Skin cancer      PSH: Past Surgical History  Procedure Laterality Date  . Wart removal      Scrotal wart removal 10+ years ago  . Cystoscopy  07/18/2012    Procedure: CYSTOSCOPY;  Surgeon: Alexis Frock, MD;  Location: [facility];  Service: Urology;  Laterality: N/A;  . Mass excision  07/18/2012    Procedure: EXCISION MASS;  Surgeon: Alexis Frock, MD;  Location: [facility];  Service: Urology;  Laterality: Right;  . Laparotomy N/A 11/22/2012    Procedure: EXPLORATORY LAPAROTOMY LADD'S PROCEDURE;  Surgeon: Rolm Bookbinder, MD;  Location: [facility];  Service: General;  Laterality: N/A;  . Appendectomy N/A 11/22/2012     Procedure: APPENDECTOMY;  Surgeon: Rolm Bookbinder, MD;  Location: [facility];  Service: General;  Laterality: N/A;  . Right upper arm picc line placement    . Drainage catheter-right buttock perineal area     I have reviewed the FH and SH and  If appropriate update it with new information. No Known Allergies Scheduled Meds: . azithromycin  500 mg Intravenous Q24H  . cefTRIAXone (ROCEPHIN)  IV  1 g Intravenous Q24H  . docusate sodium  100 mg Oral BID  . enoxaparin  110 mg Subcutaneous Q24H  . metoprolol tartrate  25 mg Oral BID  . pantoprazole  40 mg Oral Daily  . polyethylene glycol  17 g Oral Daily  . senna  1 tablet Oral BID   Continuous Infusions: . sodium chloride 1,000 mL (12/15/13 0600)   PRN Meds:.acetaminophen, lip balm, morphine, morphine injection, ondansetron (ZOFRAN) IV, ondansetron    BP 112/59  Pulse 116  Temp(Src) 98.3 F (36.8 C) (Oral)  Resp 18  Ht 6' (1.829 m)  Wt 101.5 kg (223 lb 12.3 oz)  BMI 30.34 kg/m2  SpO2 98%   PPS:30-40%   Intake/Output Summary (Last 24 hours) at 12/16/13 0950 Last data filed at 12/16/13 0600  Gross per 24 hour  Intake      0 ml  Output     40 ml  Net    -40 ml   LBM: PTA  Physical Exam:  General:  Very confused with word finding difficulties, eyes do not appear to be seeing fully HEENT:  PERRL, but has the stare of occipital blindness, can see light and movement but using hearing to take in information. Chest: decreased anteriorly , occasional upper airway rhonchi CVS: tachy, S1, S2 Abdomen:soft, not tender or distended Ext: moving all extremities but has a ballismus quality to it Neuro:truncal involuntary movements,  Possible decreased vision.  Labs: CBC    Component Value Date/Time   WBC 13.0* 12/15/2013 0528   WBC 4.4 07/29/2012 1011   RBC 2.89* 12/15/2013 0528   RBC 4.29 07/29/2012 1011   HGB 7.6* 12/15/2013 0528   HGB 13.3 07/29/2012 1011   HCT 24.2* 12/15/2013 0528   HCT 39.4 07/29/2012 1011   PLT 429*  12/15/2013 0528   PLT 254 07/29/2012 1011   MCV 83.7 12/15/2013 0528   MCV 91.9 07/29/2012 1011   MCH 26.3 12/15/2013 0528   MCH 31.1 07/29/2012 1011   MCHC 31.4 12/15/2013 0528   MCHC 33.8 07/29/2012 1011   RDW 15.7* 12/15/2013 0528   RDW 13.1 07/29/2012 1011   LYMPHSABS 0.8 12/14/2013 1540   LYMPHSABS 1.9 07/29/2012 1011   MONOABS 0.7 12/14/2013 1540   MONOABS 0.4 07/29/2012 1011   EOSABS 0.0 12/14/2013 1540   EOSABS 0.0  07/29/2012 1011   BASOSABS 0.0 12/14/2013 1540   BASOSABS 0.0 07/29/2012 1011     CMP     Component Value Date/Time   NA 142 12/15/2013 0528   NA 147* 07/29/2012 1011   K 4.8 12/15/2013 0528   K 4.2 07/29/2012 1011   CL 104 12/15/2013 0528   CL 108* 07/29/2012 1011   CO2 14* 12/15/2013 0528   CO2 24 07/29/2012 1011   GLUCOSE 127* 12/15/2013 0528   GLUCOSE 131* 07/29/2012 1011   BUN 70* 12/15/2013 0528   BUN 10.0 07/29/2012 1011   CREATININE 12.95* 12/15/2013 0528   CREATININE 1.47* 03/10/2013 1809   CREATININE 1.1 07/29/2012 1011   CALCIUM 8.4 12/15/2013 0528   CALCIUM 9.3 07/29/2012 1011   PROT 6.5 12/15/2013 0528   PROT 6.8 07/29/2012 1011   ALBUMIN 1.9* 12/15/2013 0528   ALBUMIN 3.5 07/29/2012 1011   AST 18 12/15/2013 0528   AST 16 07/29/2012 1011   ALT 25 12/15/2013 0528   ALT 29 07/29/2012 1011   ALKPHOS 49 12/15/2013 0528   ALKPHOS 63 07/29/2012 1011   BILITOT <0.2* 12/15/2013 0528   BILITOT 0.37 07/29/2012 1011   GFRNONAA 4* 12/15/2013 0528   GFRNONAA 52* 03/10/2013 1809   GFRAA 4* 12/15/2013 0528   GFRAA 60 03/10/2013 1809    Chest Xray Reviewed/Impressions:Mild patchy airspace disease in the right lung may be due to  pneumonia    Time In Time Out Total Time Spent with Patient Total Overall Time  805 am/ 500 pm 850 am/530 pm 75 min 75 min    Greater than 50%  of this time was spent counseling and coordinating care related to the above assessment and plan.  Aaisha Sliter L. Lovena Le, MD MBA The Palliative Medicine Team at [facility] Phone:  [phone] Pager: [phone]

## 2013-12-16 NOTE — Progress Notes (Signed)
Nutrition Brief Note  Patient identified on [place] Report Chart reviewed. Pt now transitioning to comfort care.  No further nutrition interventions warranted at this time.  Please consult as needed.   Phares Zaccone F Ople Girgis MS RD LDN Clinical Dietitian Pager:[phone]    

## 2013-12-16 NOTE — Progress Notes (Signed)
[facility]ospice [facility] Pall[facility])  Hospice Home Care Chaplain Note   Patient(pt):Theodore Flores  Room: [place] chaplain visited to assess for spiritual needs.  Pt was awake, and several family members were at bedside.  Chaplain utilized active listening and observation, and affirmed family's faith.  Chaplain provided pastoral presence, sacred song, and prayer with blessing.  Chaplain will follow.  Beverly Isley-Landreth ThM, [facility] Clinical Chaplain

## 2013-12-16 NOTE — Progress Notes (Signed)
12/16/13 1000  Clinical Encounter Type  Visited With Patient and family together;Other (Comment) (BIL (wife Pearl's brother))  Visit Type Follow-up;Spiritual support;Social support  Referral From Chaplain (Charles Lumpkin, DMin)  Spiritual Encounters  Spiritual Needs Prayer;Emotional;Grief support (supporting fmaily through EOL journey/anticipatory grief)   Visited with Al and his brother-in-law to offer further pastoral presence and support.  Al spoke to me directly at times, asking (perhaps existential, metaphorical) questions about how to unlock the house and where we are going next.  He was also seeking people in the room, thinking that there may have been more than the three of [place].  Provided spiritual companionship, claming presence, prayer at bedside.  Let BIL know of ongoing chaplain availability.  He was grateful.  Please page as needs arise:  [phone].  Thank you.  Bened[place], [place]

## 2013-12-16 NOTE — Progress Notes (Addendum)
[facility] PROGRESS NOTE  Theodore Flores IDP:[id number] DOB: 1956-06-23 DOA: 12/14/2013 PCP: Chari Manning, NP  Brief narrative: 58 year old male with past medical history of squamous cell scrotal carcinoma, being followed by hospice who presented to [facility] ED 12/14/2013 with reports of worsening mental status changes, confusion for past 3 days prior to this admission.  In ED, blood pressure is 134/71, heart rate 111, oxygen saturation 94% on room air. T max was 100.6 F. blood work revealed white blood cell count of 13, hemoglobin 7.4. Creatinine is acutely elevated at 12.24. Chest x-ray showed mild patchy airspace disease in right lung possibly due to pneumonia. Patient was given vancomycin and Zosyn on the admission. Due to renal failure with switched to azithromycin and rocephin for treatment of pneumonia. Discussion with the palliative care family decided on full comfort but in agreement to cont IV antibiotics for now.  Assessment and Plan:   Principal Problem:  Acute encephalopathy  - Likely secondary to acute urinary retention, renal failure as well as possible infectious etiology, pneumonia, UTI. Considering patient's history of scrotal cancer there may also be metastatic disease in the brain.  - appreciate palliative care team following   Active Problems:  Sepsis, UTI, Pneumonia  - Patient is hemodynamically stable, does not require pressor support. Continue Azithromycin and Rocephin for possible pneumonia and / or UTI  Acute renal failure / Urinary retention  - Patient was on lot of nephrotoxic medications including lisinopril, metformin, Bactrim. All stopped. No further blood draws to ensure comfort care. Cancer related pain  - Patient is on multiple pain medications at home which could contribute to mental status changes. We only continued MS Contin 30 mg every 6 hours as needed for pain as well as morphine 2 mg IV every 2 hours as needed for pain. Pain controlled. Hypertension   - Hold lisinopril secondary to renal failure. May continue metoprolol  Malignant neoplasm of scrotum  - Patient is being followed by hospice  PE (pulmonary embolism)  - Continue Lovenox for now Diabetes  - A1c in March 2015 was 9.1 indicating poor glycemic control  - Hold metformin due to renal failure  Anemia of chronic disease  - Secondary to history of malignancy. Hemoglobin was 7.4 on the admission. Repeat CBC showed hgb of 6.8; pt transfused 1 unit PRBC and post transfusion hemoglobin is 7.6  Leukocytosis  - Secondary to sepsis, UTI and pneumonia  Functional quadriplegia  - bed bound due to advanced malignancy   Code Status: DNR/DNI  Family Communication: Family at bedside    Consultants:  Palliative care Procedures:  None  Antibiotics:  Azithromycin 12/14/2013 -->  Rocephin 12/14/2013 -->  Robbie Lis, MD  [facility] Pager [phone]  If 7PM-7AM, please contact night-coverage [email or web address] Password [email or web address] 12/16/2013, 4:25 PM   LOS: 2 days    HPI/Subjective: No overnight events.  Objective: Filed Vitals:   12/15/13 1134 12/15/13 1500 12/15/13 2146 12/16/13 1355  BP: 132/68 119/60 112/59   Pulse: 120 111 116   Temp:  99.3 F (37.4 C) 98.3 F (36.8 C) 100.7 F (38.2 C)  TempSrc:  Oral Oral Axillary  Resp:  16 18   Height:      Weight:      SpO2: 96% 95% 98%     Intake/Output Summary (Last 24 hours) at 12/16/13 1625 Last data filed at 12/16/13 0600  Gross per 24 hour  Intake      0 ml  Output     20 ml  Net    -20 ml    Exam:   General:  Pt has blank stare, not able to respond to questions but he is awake  Cardiovascular: Regular rate and rhythm, S1/S2, no murmurs, no rubs, no gallops  Respiratory: Clear to auscultation bilaterally, no wheezing, no crackles, no rhonchi  Abdomen: Soft, non tender, non distended, bowel sounds present, no guarding  Extremities: No edema, pulses DP and PT palpable bilaterally  Neuro: Grossly  nonfocal  Data Reviewed: Basic Metabolic Panel:  Recent Labs Lab 12/14/13 1047 12/14/13 1540 12/15/13 0528  NA 141 142 142  K 4.6 4.7 4.8  CL 99 102 104  CO2 14* 14* 14*  GLUCOSE 150* 170* 127*  BUN 67* 65* 70*  CREATININE 12.24* 11.85* 12.95*  CALCIUM 9.4 8.5 8.4  MG  --  2.2  --   PHOS  --  6.2*  --    Liver Function Tests:  Recent Labs Lab 12/14/13 1047 12/14/13 1540 12/15/13 0528  AST 24 20 18   ALT 29 26 25   ALKPHOS 56 51 49  BILITOT <0.2* <0.2* <0.2*  PROT 7.4 6.7 6.5  ALBUMIN 2.0* 1.9* 1.9*   No results found for this basename: LIPASE, AMYLASE,  in the last 168 hours No results found for this basename: AMMONIA,  in the last 168 hours CBC:  Recent Labs Lab 12/14/13 1047 12/14/13 1540 12/15/13 0528  WBC 13.0* 12.6* 13.0*  NEUTROABS 11.2* 11.2*  --   HGB 7.4* 6.8* 7.6*  HCT 23.1* 21.7* 24.2*  MCV 83.1 83.5 83.7  PLT 498* 444* 429*   Cardiac Enzymes: No results found for this basename: CKTOTAL, CKMB, CKMBINDEX, TROPONINI,  in the last 168 hours BNP: No components found with this basename: POCBNP,  CBG:  Recent Labs Lab 12/14/13 2106 12/15/13 0839 12/15/13 1159 12/15/13 1711 12/15/13 2142  GLUCAP 108* 126* 117* 134* 129*    Recent Results (from the past 240 hour(s))  CULTURE, BLOOD (ROUTINE X 2)     Status: None   Collection Time    12/14/13 10:47 AM      Result Value Ref Range Status   Specimen Description BLOOD LEFT HAND   Final   Special Requests BOTTLES DRAWN AEROBIC AND ANAEROBIC 5 CC EACH   Final   Culture  Setup Time     Final   Value: 12/14/2013 14:59     Performed at [facility]   Culture     Final   Value:        BLOOD CULTURE RECEIVED NO GROWTH TO DATE CULTURE WILL BE HELD FOR 5 DAYS BEFORE ISSUING A FINAL NEGATIVE REPORT     Performed at [facility]   Report Status PENDING   Incomplete  CULTURE, BLOOD (ROUTINE X 2)     Status: None   Collection Time    12/14/13 10:51 AM      Result Value Ref Range  Status   Specimen Description BLOOD LEFT ARM   Final   Special Requests BOTTLES DRAWN AEROBIC AND ANAEROBIC 5 CC EACH   Final   Culture  Setup Time     Final   Value: 12/14/2013 14:58     Performed at [facility]   Culture     Final   Value:        BLOOD CULTURE RECEIVED NO GROWTH TO DATE CULTURE WILL BE HELD FOR 5 DAYS BEFORE ISSUING A FINAL NEGATIVE REPORT     Performed at [facility]  Report Status PENDING   Incomplete  URINE CULTURE     Status: None   Collection Time    12/15/13  6:17 AM      Result Value Ref Range Status   Specimen Description URINE, CATHETERIZED   Final   Special Requests NONE   Final   Culture  Setup Time     Final   Value: 12/15/2013 09:35     Performed at [place]     Final   Value: >=100,000 COLONIES/ML     Performed at [facility]   Culture     Final   Value: YEAST     Performed at [facility]   Report Status 12/16/2013 FINAL   Final     Studies: No results found.  Scheduled Meds: . azithromycin  500 mg Intravenous Q24H  . cefTRIAXone (ROCEPHIN)  IV  1 g Intravenous Q24H  . docusate sodium  100 mg Oral BID  . enoxaparin  110 mg Subcutaneous Q24H  . LORazepam      . metoprolol tartrate  25 mg Oral BID  . pantoprazole  40 mg Oral Daily  . polyethylene glycol  17 g Oral Daily  . senna  1 tablet Oral BID   Continuous Infusions: . sodium chloride 1,000 mL (12/15/13 0600)

## 2013-12-16 NOTE — Progress Notes (Signed)
Patient Theodore Flores      DOB: 13-Feb-1956      ZES:[id number]  Patient resting comfortably,asleep.  Spouse not in room.  Extended family in need of nothing new.  Will not disturb patient. Asked that they let spouse know I was by and that she can call if she needs me.   Meshilem Machuca L. Lovena Le, MD MBA The Palliative Medicine Team at [facility] Phone: [phone] Pager: [phone]

## 2013-12-16 NOTE — Progress Notes (Addendum)
Inpatient RN [place]  Room 1318 -[facility] RN Visit-Karen Alford [place] RN  Related noncovered admission to [facility] diagnosis of Ca of the Scrotum w/mets. Pt is DNR  code.  Pt lying in bed, with eyes partially closed, small movements noted of hands/arms. Wife West Carbo and son Deon asleep in room. Staff RN Alyse Low reports pt had a "good night", slept with family present in the room. Pt recvd 2mg  of PRN IV morphine IV overnight.  Per PMT note family has opted for comfort care, labs and CBG monitoring have been d/cd, abt to continue for now and a hospital death is expected. [facility] will continue to follow through discharge/death..  Patient's home medication list and transfer summary in place on shadow chart.   Please call [facility] @ [phone]- with any hospice needs.   Thank you. Tracey Harries, RN  [facility]  Hospice Liaison  [phone])

## 2013-12-17 MED ORDER — FLUCONAZOLE IN SODIUM CHLORIDE 200-0.9 MG/100ML-% IV SOLN
200.0000 mg | INTRAVENOUS | Status: DC
  Administered 2013-12-17 – 2013-12-18 (×2): 200 mg via INTRAVENOUS
  Filled 2013-12-17 (×3): qty 100

## 2013-12-17 MED ORDER — ENSURE COMPLETE PO LIQD
237.0000 mL | Freq: Two times a day (BID) | ORAL | Status: DC
  Administered 2013-12-18 – 2013-12-19 (×3): 237 mL via ORAL

## 2013-12-17 MED ORDER — METOPROLOL TARTRATE 1 MG/ML IV SOLN
2.5000 mg | Freq: Four times a day (QID) | INTRAVENOUS | Status: DC
  Administered 2013-12-17: 21:00:00 via INTRAVENOUS
  Administered 2013-12-18 – 2013-12-19 (×7): 2.5 mg via INTRAVENOUS
  Filled 2013-12-17 (×11): qty 5

## 2013-12-17 MED ORDER — LORAZEPAM 2 MG/ML IJ SOLN
0.5000 mg | INTRAMUSCULAR | Status: DC | PRN
  Administered 2013-12-17 – 2013-12-19 (×7): 0.5 mg via INTRAVENOUS
  Filled 2013-12-17 (×7): qty 1

## 2013-12-17 MED ORDER — FUROSEMIDE 10 MG/ML IJ SOLN
20.0000 mg | Freq: Once | INTRAMUSCULAR | Status: AC
  Administered 2013-12-17: 20 mg via INTRAVENOUS
  Filled 2013-12-17: qty 2

## 2013-12-17 MED ORDER — SODIUM CHLORIDE 0.9 % IV SOLN: 1000.0000 mL | INTRAVENOUS | Status: DC

## 2013-12-17 NOTE — Progress Notes (Signed)
[facility] PROGRESS NOTE  Theodore Flores DXI:[id number] DOB: [birth year] DOA: 12/14/2013 PCP: Chari Manning, NP  Brief narrative: 58 year old male with past medical history of squamous cell scrotal carcinoma, being followed by hospice who presented to [facility] ED 12/14/2013 with reports of worsening mental status changes, confusion for past 3 days prior to this admission.  In ED, blood pressure is 134/71, heart rate 111, oxygen saturation 94% on room air. T max was 100.6 F. blood work revealed white blood cell count of 13, hemoglobin 7.4. Creatinine is acutely elevated at 12.24. Chest x-ray showed mild patchy airspace disease in right lung possibly due to pneumonia. Patient was given vancomycin and Zosyn on the admission. Due to renal failure with switched to azithromycin and rocephin for treatment of pneumonia. Discussion with the palliative care family decided on full comfort but in agreement to cont IV antibiotics for now.   Assessment and Plan:   Principal Problem:  Acute encephalopathy  - Likely secondary to acute urinary retention, renal failure as well as possible infectious etiology, pneumonia, UTI. Also, advanced malignancy. - appreciate palliative care team following  - continue comfort care Active Problems:  Sepsis, UTI, Pneumonia  - Patient is hemodynamically stable, does not require pressor support. Continue Azithromycin and Rocephin for possible pneumonia and / or UTI. I added fluconazole for yeast in urine based on UA and urine culture  Acute renal failure / Urinary retention  - Patient was on lot of nephrotoxic medications including lisinopril, metformin, Bactrim. All stopped. No further blood draws to ensure comfort care.  Cancer related pain  - Patient is on multiple pain medications at home which could contribute to mental status changes. - current pain meds are morphine 2 mg IV every 2 hours PRN and MS IR PO PRN if able to have PO intake Hypertension  - Hold lisinopril  secondary to renal failure. May continue metoprolol  Malignant neoplasm of scrotum  - Patient is being followed by hospice  PE (pulmonary embolism)  - Continue Lovenox for now  Diabetes  - A1c in March 2015 was 9.1 indicating poor glycemic control  - Hold metformin due to renal failure  - minimize CBG's due ensure comfort Anemia of chronic disease  - Secondary to history of malignancy. Hemoglobin was 7.4 on the admission. Repeat CBC showed hgb of 6.8; pt transfused 1 unit PRBC and post transfusion hemoglobin is 7.6  Leukocytosis  - Secondary to sepsis, UTI and pneumonia  Functional quadriplegia  - bed bound due to advanced malignancy    Code Status: DNR/DNI  Family Communication: Family at bedside  Disposition Plan: remains inpatient    Consultants:  Palliative care Procedures:  None  Antibiotics:  Azithromycin 12/14/2013 -->  Rocephin 12/14/2013 --> Fluconazoel 12/17/2013 -->  Robbie Lis, MD  [facility] Pager [phone]  If 7PM-7AM, please contact night-coverage [email or web address] Password [facility] 12/17/2013, 5:45 PM   LOS: 3 days    HPI/Subjective: No acute overnight events.   Objective: Filed Vitals:   12/16/13 1355 12/16/13 2144 12/17/13 0006 12/17/13 0630  BP:  144/56 111/54 96/51  Pulse:  117 112 117  Temp: 100.7 F (38.2 C) 98.4 F (36.9 C)  98.2 F (36.8 C)  TempSrc: Axillary Oral  Oral  Resp:  16 18 16   Height:      Weight:      SpO2:  97% 99% 99%    Intake/Output Summary (Last 24 hours) at 12/17/13 1745 Last data filed at 12/17/13 0815  Gross per 24 hour  Intake      0 ml  Output    165 ml  Net   -165 ml    Exam:   General:  Pt is awake, confused  Cardiovascular: tachycardic, (+) S1, S2  Respiratory: no wheezing, no rhonchi  Abdomen: Soft, non tender, non distended, bowel sounds present, no guarding  Extremities: trace edema, uni boots on, pulses DP and PT palpable bilaterally  Neuro: Grossly nonfocal  Data Reviewed: Basic  Metabolic Panel:  Recent Labs Lab 12/14/13 1047 12/14/13 1540 12/15/13 0528  NA 141 142 142  K 4.6 4.7 4.8  CL 99 102 104  CO2 14* 14* 14*  GLUCOSE 150* 170* 127*  BUN 67* 65* 70*  CREATININE 12.24* 11.85* 12.95*  CALCIUM 9.4 8.5 8.4  MG  --  2.2  --   PHOS  --  6.2*  --    Liver Function Tests:  Recent Labs Lab 12/14/13 1047 12/14/13 1540 12/15/13 0528  AST 24 20 18   ALT 29 26 25   ALKPHOS 56 51 49  BILITOT <0.2* <0.2* <0.2*  PROT 7.4 6.7 6.5  ALBUMIN 2.0* 1.9* 1.9*   No results found for this basename: LIPASE, AMYLASE,  in the last 168 hours No results found for this basename: AMMONIA,  in the last 168 hours CBC:  Recent Labs Lab 12/14/13 1047 12/14/13 1540 12/15/13 0528  WBC 13.0* 12.6* 13.0*  NEUTROABS 11.2* 11.2*  --   HGB 7.4* 6.8* 7.6*  HCT 23.1* 21.7* 24.2*  MCV 83.1 83.5 83.7  PLT 498* 444* 429*   Cardiac Enzymes: No results found for this basename: CKTOTAL, CKMB, CKMBINDEX, TROPONINI,  in the last 168 hours BNP: No components found with this basename: POCBNP,  CBG:  Recent Labs Lab 12/14/13 2106 12/15/13 0839 12/15/13 1159 12/15/13 1711 12/15/13 2142  GLUCAP 108* 126* 117* 134* 129*    Recent Results (from the past 240 hour(s))  CULTURE, BLOOD (ROUTINE X 2)     Status: None   Collection Time    12/14/13 10:47 AM      Result Value Ref Range Status   Specimen Description BLOOD LEFT HAND   Final   Special Requests BOTTLES DRAWN AEROBIC AND ANAEROBIC 5 CC EACH   Final   Culture  Setup Time     Final   Value: 12/14/2013 14:59     Performed at [facility]   Culture     Final   Value:        BLOOD CULTURE RECEIVED NO GROWTH TO DATE CULTURE WILL BE HELD FOR 5 DAYS BEFORE ISSUING A FINAL NEGATIVE REPORT     Performed at [facility]   Report Status PENDING   Incomplete  CULTURE, BLOOD (ROUTINE X 2)     Status: None   Collection Time    12/14/13 10:51 AM      Result Value Ref Range Status   Specimen Description BLOOD  LEFT ARM   Final   Special Requests BOTTLES DRAWN AEROBIC AND ANAEROBIC 5 CC EACH   Final   Culture  Setup Time     Final   Value: 12/14/2013 14:58     Performed at [facility]   Culture     Final   Value:        BLOOD CULTURE RECEIVED NO GROWTH TO DATE CULTURE WILL BE HELD FOR 5 DAYS BEFORE ISSUING A FINAL NEGATIVE REPORT     Performed at [facility]   Report Status PENDING  Incomplete  URINE CULTURE     Status: None   Collection Time    12/15/13  6:17 AM      Result Value Ref Range Status   Specimen Description URINE, CATHETERIZED   Final   Special Requests NONE   Final   Culture  Setup Time     Final   Value: 12/15/2013 09:35     Performed at [place]     Final   Value: >=100,000 COLONIES/ML     Performed at [facility]   Culture     Final   Value: YEAST     Performed at [facility]   Report Status 12/16/2013 FINAL   Final     Studies: No results found.  Scheduled Meds: . azithromycin  500 mg Intravenous Q24H  . cefTRIAXone (ROCEPHIN)  IV  1 g Intravenous Q24H  . enoxaparin  110 mg Subcutaneous Q24H  . metoprolol tartrate  25 mg Oral BID  . pantoprazole  40 mg Oral Daily  . senna  1 tablet Oral BID   Continuous Infusions: . sodium chloride 1,000 mL (12/17/13 1500)

## 2013-12-17 NOTE — Progress Notes (Signed)
DNR.  Related admission.  Patient in bed with eyes opened.  Does not appear to be in any pain.  Family at the bedside.  No concerns noted at present.  Family appreciative of all the support from staff.  Please contact [facility] @ [phone] with any questions or concerns.  Vance Gather, RN [facility]

## 2013-12-18 MED ORDER — PANTOPRAZOLE SODIUM 40 MG IV SOLR
40.0000 mg | INTRAVENOUS | Status: DC
  Filled 2013-12-18: qty 40

## 2013-12-18 NOTE — Progress Notes (Signed)
DNR.  Related admission.  Patient in the bed with eyes opened.  Daughter present at the bedside.  Patient taking sips of water during this visit.  Occassional grimaces noted from patient.  Daughter with no concerns at this time.  Please contact [place] with any questions or concerns.  Vance Gather, RN [facility]

## 2013-12-18 NOTE — Progress Notes (Signed)
Patient BW:Theodore Flores      DOB: 08/05/1956      DHR:[id number]   Palliative Medicine Team at [facility] Progress Note    Subjective:  Patient sleeping comfortably.  Total pain medication use in the last 24 hours 8 mg.  Patient has not been able to take his oral medications which have been stopped.     Filed Vitals:   12/18/13 0619  BP: 112/62  Pulse: 114  Temp: 98.5 F (36.9 C)  Resp: 12   Physical exam:  General: sleeping soundly did not disturb   Assessment and plan: 58 yr old african [place] male with metastatic squamous cell cancer.  Symptoms respond to prn morphine . Will check with team in am as too whether [facility] Transition is appropriate.  Uziel Covault L. Lovena Le, MD MBA The Palliative Medicine Team at [facility] Phone: [phone] Pager: [phone]

## 2013-12-18 NOTE — Consult Note (Signed)
WOC wound consult note Reason for Consult:Assessment and suggestions for care of the scrotal wound/tumor and other areas Wound type:neoplasm to scrotum, also moisture associated skin damage to gluteal cleft and integumentary changes associated with disease to medial left thich Pressure Ulcer POA: No Measurement:Scrotal wound:  Approximately 4cm x 3cm x 0.4cm with red and yellow tissue evident. Scant exudate, light yellow in color.  Periwound is indurated, but without erythema.  Left medial thigh: 2cm x 1.5cm x 0.2cm partial thickness open area with pink tissue.  No erythema, no induration, no warmth; scant exudate.  Intertriginous wound at gluteal cleft:  Pink, moist tissue measuring 3.5cm x 0.5cm x 0.2cm with clean, pink and moist wound bed, minimal serous exudate Wound bed:As described above Drainage (amount, consistency, odor) As described above. Periwound:As described above. Dressing procedure/placement/frequency:I will implement a petrolatum impregnated gauze to the scrotal wound, a soft silicone sacral dressing to the intertriginous wound at the gluteal cleft and a soft silicone dressing to the medial left thigh. Patient was mildly uncomfortable (as evidenced by facial grimacing) during the turning, repositioning, wound cleansing and redressing process, but immediately rested upon our completion. I briefly considered a therapeutic mattress replacement with low air loss feature, but observed him at rest both prior to and immediately following the procedure, so I have elected not to employ at this time. It is not uncommon for the subtle and constant shifting in position with the air surface to be even more uncomfortable for the patient than the regular pressure redistribution mattress currently in place.This is still an option to the medical team if you so desire. [place] nursing team will not follow routinely, but will remain available to this patient, the nursing and medical teams.  Please re-consult if  needed or if wound progress is not consistent with overall patient status. Thanks, Maudie Flakes, MSN, RN, [place], [place], [place] [phone])

## 2013-12-18 NOTE — Progress Notes (Addendum)
[facility] PROGRESS NOTE  Theodore Flores ZOX:[id number] DOB: [birth year] DOA: 12/14/2013 PCP: Chari Manning, NP  Brief narrative: 58 year old male with past medical history of squamous cell scrotal carcinoma, being followed by hospice who presented to [facility] ED 12/14/2013 with reports of worsening mental status changes, confusion for past 3 days prior to this admission.  In ED, blood pressure is 134/71, heart rate 111, oxygen saturation 94% on room air. T max was 100.6 F. blood work revealed white blood cell count of 13, hemoglobin 7.4. Creatinine is acutely elevated at 12.24. Chest x-ray showed mild patchy airspace disease in right lung possibly due to pneumonia. Patient was given vancomycin and Zosyn on the admission. Due to renal failure with switched to azithromycin and rocephin for treatment of pneumonia. Discussion with the palliative care family - family decided on full comfort but in agreement to cont IV antibiotics for now.   Assessment and Plan:   Principal Problem:  Acute encephalopathy  - Likely secondary to acute urinary retention, renal failure as well as possible infectious etiology, pneumonia, UTI. Considering patient's history of scrotal cancer there may also be metastatic disease to the brain.  - appreciate palliative care team following  - comfort care Active Problems:  Sepsis, UTI, Pneumonia  - Patient is hemodynamically stable, does not require pressor support. Has received total of 5 days on IV rocephin and Zithromax. We can stop this today. He was started on fluconazole for yeast UTI on 12/17/13. UTI, yeast - started fluconazole 12/17/2013  Acute renal failure / Urinary retention  - minimal urine output since admission - no further blood draws to ensure comfort care Cancer related pain  - current pain management with morphine IV PRN Hypertension  - Continue IV metoprolol 2.5 mg every 6 hours  Malignant neoplasm of scrotum  - Patient is being followed by hospice -  comfort care   PE (pulmonary embolism)  - Continue Lovenox for now  Diabetes  - A1c in March 2015 was 9.1 indicating poor glycemic control  - Hold metformin due to renal failure  Anemia of chronic disease  - Secondary to history of malignancy. Hemoglobin was 7.4 on the admission. Repeat CBC showed hgb of 6.8; pt transfused 1 unit PRBC and post transfusion hemoglobin is 7.6  - No further blood work to ensure comfort care  Leukocytosis  - Secondary to sepsis, UTI and pneumonia  Functional quadriplegia  - bed bound due to advanced malignancy   Code Status: DNR/DNI  Family Communication: Family at bedside   Consultants:  Palliative care Procedures:  None  Antibiotics:  Azithromycin 12/14/2013 --> 12/18/2013 Rocephin 12/14/2013 --> 12/18/2013 Fluconazole 12/17/2013 -->   Robbie Lis, MD  [facility] Pager [phone]  If 7PM-7AM, please contact night-coverage [email or web address] Password [email or web address] 12/18/2013, 12:28 PM   LOS: 4 days    HPI/Subjective: No acute overnight events.  Objective: Filed Vitals:   12/17/13 0630 12/17/13 2126 12/18/13 0619 12/18/13 0637  BP: 96/51 112/64 112/62   Pulse: 117 113 114   Temp: 98.2 F (36.8 C) 99.8 F (37.7 C) 98.5 F (36.9 C)   TempSrc: Oral Axillary Oral   Resp: 16 16 12    Height:      Weight:    109.9 kg (242 lb 4.6 oz)  SpO2: 99% 98% 100%     Intake/Output Summary (Last 24 hours) at 12/18/13 1228 Last data filed at 12/17/13 2000  Gross per 24 hour  Intake      0 ml  Output  10 ml  Net    -10 ml    Exam:   General:  Pt is awake but confused  Cardiovascular: Regular rate and rhythm, S1/S2 appreciated   Respiratory: Clear to auscultation bilaterally, no wheezing, no crackles, no rhonchi  Abdomen: Soft, non tender, non distended, bowel sounds present, no guarding; drain in place  Extremities: LE edema, una boots on, pulses DP and PT palpable bilaterally  Neuro: Grossly nonfocal  Data Reviewed: Basic Metabolic  Panel:  Recent Labs Lab 12/14/13 1047 12/14/13 1540 12/15/13 0528  NA 141 142 142  K 4.6 4.7 4.8  CL 99 102 104  CO2 14* 14* 14*  GLUCOSE 150* 170* 127*  BUN 67* 65* 70*  CREATININE 12.24* 11.85* 12.95*  CALCIUM 9.4 8.5 8.4  MG  --  2.2  --   PHOS  --  6.2*  --    Liver Function Tests:  Recent Labs Lab 12/14/13 1047 12/14/13 1540 12/15/13 0528  AST 24 20 18   ALT 29 26 25   ALKPHOS 56 51 49  BILITOT <0.2* <0.2* <0.2*  PROT 7.4 6.7 6.5  ALBUMIN 2.0* 1.9* 1.9*   No results found for this basename: LIPASE, AMYLASE,  in the last 168 hours No results found for this basename: AMMONIA,  in the last 168 hours CBC:  Recent Labs Lab 12/14/13 1047 12/14/13 1540 12/15/13 0528  WBC 13.0* 12.6* 13.0*  NEUTROABS 11.2* 11.2*  --   HGB 7.4* 6.8* 7.6*  HCT 23.1* 21.7* 24.2*  MCV 83.1 83.5 83.7  PLT 498* 444* 429*   Cardiac Enzymes: No results found for this basename: CKTOTAL, CKMB, CKMBINDEX, TROPONINI,  in the last 168 hours BNP: No components found with this basename: POCBNP,  CBG:  Recent Labs Lab 12/14/13 2106 12/15/13 0839 12/15/13 1159 12/15/13 1711 12/15/13 2142  GLUCAP 108* 126* 117* 134* 129*    Recent Results (from the past 240 hour(s))  CULTURE, BLOOD (ROUTINE X 2)     Status: None   Collection Time    12/14/13 10:47 AM      Result Value Ref Range Status   Specimen Description BLOOD LEFT HAND   Final   Special Requests BOTTLES DRAWN AEROBIC AND ANAEROBIC 5 CC EACH   Final   Culture  Setup Time     Final   Value: 12/14/2013 14:59     Performed at [facility]   Culture     Final   Value:        BLOOD CULTURE RECEIVED NO GROWTH TO DATE CULTURE WILL BE HELD FOR 5 DAYS BEFORE ISSUING A FINAL NEGATIVE REPORT     Performed at [facility]   Report Status PENDING   Incomplete  CULTURE, BLOOD (ROUTINE X 2)     Status: None   Collection Time    12/14/13 10:51 AM      Result Value Ref Range Status   Specimen Description BLOOD LEFT ARM    Final   Special Requests BOTTLES DRAWN AEROBIC AND ANAEROBIC 5 CC EACH   Final   Culture  Setup Time     Final   Value: 12/14/2013 14:58     Performed at [facility]   Culture     Final   Value:        BLOOD CULTURE RECEIVED NO GROWTH TO DATE CULTURE WILL BE HELD FOR 5 DAYS BEFORE ISSUING A FINAL NEGATIVE REPORT     Performed at [facility]   Report Status PENDING  Incomplete  URINE CULTURE     Status: None   Collection Time    12/15/13  6:17 AM      Result Value Ref Range Status   Specimen Description URINE, CATHETERIZED   Final   Special Requests NONE   Final   Culture  Setup Time     Final   Value: 12/15/2013 09:35     Performed at [place]     Final   Value: >=100,000 COLONIES/ML     Performed at [facility]   Culture     Final   Value: YEAST     Performed at [facility]   Report Status 12/16/2013 FINAL   Final     Studies: No results found.  Scheduled Meds: . azithromycin  500 mg Intravenous Q24H  . cefTRIAXone (ROCEPHIN)  IV  1 g Intravenous Q24H  . enoxaparin  110 mg Subcutaneous Q24H  . feeding supplement (ENSURE COMPLETE)  237 mL Oral BID BM  . fluconazole (DIFLUCAN)   200 mg Intravenous Q24H  . metoprolol  2.5 mg Intravenous 4 times per day  . pantoprazole  40 mg Oral Daily  . senna  1 tablet Oral BID   Continuous Infusions: . sodium chloride 1,000 mL (12/17/13 1500)

## 2013-12-19 MED ORDER — FLUCONAZOLE IN SODIUM CHLORIDE 200-0.9 MG/100ML-% IV SOLN: 200.0000 mg | INTRAVENOUS | Status: AC

## 2013-12-19 MED ORDER — METOPROLOL TARTRATE 1 MG/ML IV SOLN: 2.5000 mg | Freq: Four times a day (QID) | INTRAVENOUS | Status: AC

## 2013-12-19 MED ORDER — ONDANSETRON HCL 4 MG/2ML IJ SOLN: 4.0000 mg | Freq: Four times a day (QID) | INTRAMUSCULAR | Status: AC | PRN

## 2013-12-19 MED ORDER — MORPHINE SULFATE 2 MG/ML IJ SOLN: 2.0000 mg | INTRAMUSCULAR | Status: AC | PRN

## 2013-12-19 MED ORDER — LORAZEPAM 2 MG/ML IJ SOLN: 0.5000 mg | INTRAMUSCULAR | Status: AC | PRN

## 2013-12-19 NOTE — Progress Notes (Signed)
Clinical Social Work Department BRIEF PSYCHOSOCIAL ASSESSMENT 12/19/2013  Patient:  Theodore Flores, Theodore Flores     Account Number:  [id number]     Admit date:  12/14/2013  Clinical Social Worker:  Ulyess Blossom  Date/Time:  12/19/2013 10:30 AM  Referred by:  Physician  Date Referred:  12/19/2013 Referred for  Residential hospice placement   Other Referral:   Interview type:  Family Other interview type:    PSYCHOSOCIAL DATA Living Status:  FAMILY Admitted from facility:   Level of care:   Primary support name:  Pearl Gaul/wife/[phone] Primary support relationship to patient:  SPOUSE Degree of support available:   strong    CURRENT CONCERNS Current Concerns  Post-Acute Placement   Other Concerns:    SOCIAL WORK ASSESSMENT / PLAN CSW received referral for residential hospice placement.    Per MD, initially it was anticipated at the end of last week that pt would have hospital death, but pt has been stable and appears stable for transfer to residential hospice when bed available. Per chart, pt is from home with [facility].    CSW met with pt wife at bedside. CSW introduced self and explained role. CSW discussed that MD feels pt would be stable for transition to residential hospice at [facility] when bed available. Pt wife expressed that she would be agreeable to transition to [facility]. CSW provided support.     CSW spoke with [facility] SW, Ollen Gross and notified that MD feels pt would be stable for transition to [facility] and pt wife agreeable for [facility]. [facility] SW was able to speak with [facility] and confirmed bed availability for today. CSW spoke with pt wife at bedside and pt wife agreeable to transition to [facility] today.    CSW to assist with pt discharge needs to [facility] today.   Assessment/plan status:  Psychosocial Support/Ongoing Assessment of Needs Other assessment/ plan:   discharge planning   Information/referral to community resources:   Referral to  [facility]    PATIENT'S/FAMILY'S RESPONSE TO PLAN OF CARE: Pt oriented to person only. Pt wife at bedside and is a strong support to pt. Support provided to pt wife as the plans for [facility] developed today, but pt wife appears to be coping with transition to [facility] appropriately.    Alison Murray, MSW, [place] Work [phone]

## 2013-12-19 NOTE — Progress Notes (Addendum)
Inpatient RN visit- [facility] RN Visit-Karen Alford Highland RN  Related noncovered admission to [facility] diagnosis of Ca of the scrotum.  Pt is DNR code.   Pt lying in bed with eyes closed, does open eyes to voice. Respiratory rate even, unlabored, no facial grimace, pt appears comfortable. No po intake over the weekend, no urine in foley bag.  Wife Theodore Flores at bedside. Talked about their family and their faith. Theodore Flores expressed appreciation for hospital chaplain visits and Hospice support.  [facility] will continue to follow, hospital death expected.  Patient's home medication list and transfer summary in place on shadow chart.   Please call [facility] @ [phone]-  with any hospice needs.   Thank you. Tracey Harries, RN, BSN  [facility]  Hospice Liaison  [phone])  * Update to note above:  After discussion with family, Hospice SW and Staff SW pt to be transferred to [facility] for EOL care via non emergent transport today. Flo Shanks RN

## 2013-12-19 NOTE — Progress Notes (Signed)
Reported off to Agricultural engineer at [facility].

## 2013-12-19 NOTE — Discharge Summary (Signed)
Physician Discharge Summary  PIO EATHERLY VOZ:[id number] DOB: Jan 12, 1956 DOA: 12/14/2013  PCP: Chari Manning, NP  Admit date: 12/14/2013 Discharge date: 12/19/2013   Scrotal wound/tumor and other areas wound care:  Wound type:neoplasm to scrotum, also moisture associated skin damage to gluteal cleft and integumentary changes associated with disease to medial left thich   Measurement:  Scrotal wound: Approximately 4cm x 3cm x 0.4cm with red and yellow tissue evident. Scant exudate, light yellow in color. Periwound is indurated, but without erythema.   Left medial thigh: 2cm x 1.5cm x 0.2cm partial thickness open area with pink tissue. No erythema, no induration, no warmth; scant exudate. Intertriginous wound at gluteal cleft: Pink, moist tissue measuring 3.5cm x 0.5cm x 0.2cm with clean, pink and moist wound bed, minimal serous exudate   Dressing procedure/placement/frequency: Petrolatum impregnated gauze to the scrotal wound, a soft silicone sacral dressing to the intertriginous wound at the gluteal cleft and a soft silicone dressing to the medial left thigh.    Discharge Diagnoses:  Principal Problem:   Acute encephalopathy Active Problems:   Sepsis   Hypertension   Malignant neoplasm of scrotum   PE (pulmonary embolism)   Diabetes   Acute renal failure   Anemia of chronic disease   Leukocytosis   CAP (community acquired pneumonia)   Functional quadriplegia    Discharge Condition: Medically stable for discharge to residential hospice today  Diet recommendation: Not able to tolerate by mouth intake, comfort feeds per family wishes  History of present illness:  58 year old male with past medical history of squamous cell scrotal carcinoma, being followed by hospice who presented to [facility] ED 12/14/2013 with reports of worsening mental status changes, confusion for past 3 days prior to this admission.  In ED, blood pressure is 134/71, heart rate 111, oxygen saturation 94% on room  air. T max was 100.6 F. blood work revealed white blood cell count of 13, hemoglobin 7.4. Creatinine is acutely elevated at 12.24. Chest x-ray showed mild patchy airspace disease in right lung possibly due to pneumonia. Patient was given vancomycin and Zosyn on the admission. Due to renal failure with switched to azithromycin and rocephin for treatment of pneumonia.  Assessment and Plan:   Principal Problem:  Acute encephalopathy  - Likely secondary to acute urinary retention, renal failure as well as possible infectious etiology, pneumonia, UTI. Considering patient's history of scrotal cancer there may also be metastatic disease to the brain.  - appreciate palliative care team following  - discharge to residential hospice today - comfort care  Active Problems:  Sepsis, UTI, Pneumonia  - Patient is hemodynamically stable, does not require pressor support. Has received total of 5 days on IV rocephin and Zithromax. We stopped the antibiotics today 12/19/2013. Continue fluconazole for  yeast urinary tract infection. Continue for 2 more days on discharge.  UTI, yeast  - started fluconazole 12/17/2013  and continue until and including 12/21/2013 Acute renal failure / Urinary retention  - minimal urine output since admission  - no further blood draws to ensure comfort care  Cancer related pain  - current pain management with morphine IV PRN  Hypertension  - Continue IV metoprolol 2.5 mg every 6 hours  Malignant neoplasm of scrotum  - Patient is being followed by hospice  - comfort care  PE (pulmonary embolism)  - Continue Lovenox for now  Diabetes  - A1c in March 2015 was 9.1 indicating poor glycemic control  - Hold metformin due to renal failure  Anemia of  chronic disease  - Secondary to history of malignancy. Hemoglobin was 7.4 on the admission. Repeat CBC showed hgb of 6.8; pt transfused 1 unit PRBC and post transfusion hemoglobin is 7.6  - No further blood work to ensure comfort care   Leukocytosis  - Secondary to sepsis, UTI and pneumonia  Functional quadriplegia  - bed bound due to advanced malignancy   Code Status: DNR/DNI  Family Communication: Family at bedside   Consultants:  Palliative care Procedures:  None  Antibiotics:  Azithromycin 12/14/2013 --> 12/18/2013  Rocephin 12/14/2013 --> 12/18/2013  Fluconazole 12/17/2013 --> 12/21/2013  Signed:  Robbie Lis, MD  [facility] 12/19/2013, 11:25 AM  Pager #: [phone]   Discharge Exam: Filed Vitals:   12/19/13 0553  BP: 112/61  Pulse: 124  Temp: 97.5 F (36.4 C)  Resp: 20   Filed Vitals:   12/17/13 2126 12/18/13 0619 12/18/13 0637 12/19/13 0553  BP: 112/64 112/62  112/61  Pulse: 113 114  124  Temp: 99.8 F (37.7 C) 98.5 F (36.9 C)  97.5 F (36.4 C)  TempSrc: Axillary Oral  Oral  Resp: 16 12  20   Height:      Weight:   109.9 kg (242 lb 4.6 oz)   SpO2: 98% 100%  100%    General: Pt is sleeping Cardiovascular: Regular rate and rhythm, S1/S2 appreciated  Respiratory: Clear to auscultation bilaterally, no wheezing Abdominal: Soft, non tender, non distended, no guarding; has JP drain Extremities: prevalon boots on, no cyanosis, pulses palpable bilaterally DP and PT Neuro: Grossly nonfocal  Discharge Instructions  Discharge Orders   Future Appointments Provider Department Dept Phone   01/05/2014 10:30 AM Angelica Chessman, MD [place]   Future Orders Complete By Expires   Call MD for:  difficulty breathing, headache or visual disturbances  As directed    Call MD for:  persistant dizziness or light-headedness  As directed    Call MD for:  persistant nausea and vomiting  As directed    Call MD for:  severe uncontrolled pain  As directed    Diet - low sodium heart healthy  As directed    Increase activity slowly  As directed        Medication List    STOP taking these medications       acetaminophen 325 MG tablet  Commonly known as:   TYLENOL     docusate sodium 100 MG capsule  Commonly known as:  COLACE     lisinopril 10 MG tablet  Commonly known as:  PRINIVIL,ZESTRIL     metFORMIN 1000 MG tablet  Commonly known as:  GLUCOPHAGE     metoprolol tartrate 25 MG tablet  Commonly known as:  LOPRESSOR  Replaced by:  metoprolol 1 MG/ML injection     morphine 30 MG tablet  Commonly known as:  MSIR  Replaced by:  morphine 2 MG/ML injection     MORPHINE SULFATE ER PO     MS CONTIN 30 MG 12 hr tablet  Generic drug:  morphine     Oxycodone HCl 10 MG Tabs     pantoprazole 40 MG tablet  Commonly known as:  PROTONIX     PERCOCET 5-325 MG per tablet  Generic drug:  oxyCODONE-acetaminophen     polyethylene glycol packet  Commonly known as:  MIRALAX / GLYCOLAX     senna 8.6 MG Tabs tablet  Commonly known as:  SENOKOT     sulfamethoxazole-trimethoprim 800-160 MG per  tablet  Commonly known as:  BACTRIM DS      TAKE these medications       enoxaparin 120 MG/0.8ML injection  Commonly known as:  LOVENOX  Inject 0.73 mLs (110 mg total) into the skin every 12 (twelve) hours.     fluconazole 200-0.9 MG/100ML-% IVPB  Commonly known as:  DIFLUCAN  Inject 100 mLs (200 mg total) into the vein daily.     LORazepam 2 MG/ML injection  Commonly known as:  ATIVAN  Inject 0.25 mLs (0.5 mg total) into the vein every 4 (four) hours as needed for anxiety.     metoprolol 1 MG/ML injection  Commonly known as:  LOPRESSOR  Inject 2.5 mLs (2.5 mg total) into the vein every 6 (six) hours.     morphine 2 MG/ML injection  Inject 1 mL (2 mg total) into the vein every 2 (two) hours as needed.     ondansetron 4 MG/2ML Soln injection  Commonly known as:  ZOFRAN  Inject 2 mLs (4 mg total) into the vein every 6 (six) hours as needed for nausea.           Follow-up Information   Follow up with Chari Manning, NP. (As needed)    Specialty:  Internal Medicine   Contact information:   [place]  [address] [phone]        The results of significant diagnostics from this hospitalization (including imaging, microbiology, ancillary and laboratory) are listed below for reference.    Significant Diagnostic Studies: Dg Chest Port 1 View  12/14/2013   CLINICAL DATA:  Fever and pain. End-stage metastatic scrotal cancer.  EXAM: PORTABLE CHEST - 1 VIEW  COMPARISON:  CT chest and chest radiograph 11/01/2013.  FINDINGS: Trachea is midline. Heart is at the upper limits of normal in size and accentuated by low lung volumes and AP semi upright technique. Right PICC tip projects over the SVC. Mild patchy opacities in the right lung. Left lung is grossly clear. No definite pleural fluid.  IMPRESSION: Mild patchy airspace disease in the right lung may be due to pneumonia.   Electronically Signed   By: Lorin Picket M.D.   On: 12/14/2013 11:44    Microbiology: Recent Results (from the past 240 hour(s))  CULTURE, BLOOD (ROUTINE X 2)     Status: None   Collection Time    12/14/13 10:47 AM      Result Value Ref Range Status   Specimen Description BLOOD LEFT HAND   Final   Special Requests BOTTLES DRAWN AEROBIC AND ANAEROBIC 5 CC EACH   Final   Culture  Setup Time     Final   Value: 12/14/2013 14:59     Performed at [facility]   Culture     Final   Value:        BLOOD CULTURE RECEIVED NO GROWTH TO DATE CULTURE WILL BE HELD FOR 5 DAYS BEFORE ISSUING A FINAL NEGATIVE REPORT     Performed at [facility]   Report Status PENDING   Incomplete  CULTURE, BLOOD (ROUTINE X 2)     Status: None   Collection Time    12/14/13 10:51 AM      Result Value Ref Range Status   Specimen Description BLOOD LEFT ARM   Final   Special Requests BOTTLES DRAWN AEROBIC AND ANAEROBIC 5 CC EACH   Final   Culture  Setup Time     Final   Value: 12/14/2013 14:58  Performed at [facility]     Final   Value:        BLOOD CULTURE RECEIVED NO GROWTH TO DATE CULTURE WILL BE HELD FOR 5  DAYS BEFORE ISSUING A FINAL NEGATIVE REPORT     Performed at [facility]   Report Status PENDING   Incomplete  URINE CULTURE     Status: None   Collection Time    12/15/13  6:17 AM      Result Value Ref Range Status   Specimen Description URINE, CATHETERIZED   Final   Special Requests NONE   Final   Culture  Setup Time     Final   Value: 12/15/2013 09:35     Performed at [place]     Final   Value: >=100,000 COLONIES/ML     Performed at [facility]   Culture     Final   Value: YEAST     Performed at [facility]   Report Status 12/16/2013 FINAL   Final     Labs: Basic Metabolic Panel:  Recent Labs Lab 12/14/13 1047 12/14/13 1540 12/15/13 0528  NA 141 142 142  K 4.6 4.7 4.8  CL 99 102 104  CO2 14* 14* 14*  GLUCOSE 150* 170* 127*  BUN 67* 65* 70*  CREATININE 12.24* 11.85* 12.95*  CALCIUM 9.4 8.5 8.4  MG  --  2.2  --   PHOS  --  6.2*  --    Liver Function Tests:  Recent Labs Lab 12/14/13 1047 12/14/13 1540 12/15/13 0528  AST 24 20 18   ALT 29 26 25   ALKPHOS 56 51 49  BILITOT <0.2* <0.2* <0.2*  PROT 7.4 6.7 6.5  ALBUMIN 2.0* 1.9* 1.9*   No results found for this basename: LIPASE, AMYLASE,  in the last 168 hours No results found for this basename: AMMONIA,  in the last 168 hours CBC:  Recent Labs Lab 12/14/13 1047 12/14/13 1540 12/15/13 0528  WBC 13.0* 12.6* 13.0*  NEUTROABS 11.2* 11.2*  --   HGB 7.4* 6.8* 7.6*  HCT 23.1* 21.7* 24.2*  MCV 83.1 83.5 83.7  PLT 498* 444* 429*   Cardiac Enzymes: No results found for this basename: CKTOTAL, CKMB, CKMBINDEX, TROPONINI,  in the last 168 hours BNP: BNP (last 3 results) No results found for this basename: PROBNP,  in the last 8760 hours CBG:  Recent Labs Lab 12/14/13 2106 12/15/13 0839 12/15/13 1159 12/15/13 1711 12/15/13 2142  GLUCAP 108* 126* 117* 134* 129*    Time coordinating discharge: Over 30 minutes

## 2013-12-19 NOTE — Discharge Instructions (Addendum)
1. Continue fluconazole 200 mg IV daily for 2 more days on discharge for UTI yeast.  2. Wound care Scrotal wound/tumor and other areas wound care:  Wound type:neoplasm to scrotum, also moisture associated skin damage to gluteal cleft and integumentary changes associated with disease to medial left thich   Measurement:  Scrotal wound: Approximately 4cm x 3cm x 0.4cm with red and yellow tissue evident. Scant exudate, light yellow in color. Periwound is indurated, but without erythema.   Left medial thigh: 2cm x 1.5cm x 0.2cm partial thickness open area with pink tissue. No erythema, no induration, no warmth; scant exudate. Intertriginous wound at gluteal cleft: Pink, moist tissue measuring 3.5cm x 0.5cm x 0.2cm with clean, pink and moist wound bed, minimal serous exudate   Dressing procedure/placement/frequency: Petrolatum impregnated gauze to the scrotal wound, a soft silicone sacral dressing to the intertriginous wound at the gluteal cleft and a soft silicone dressing to the medial left thigh.

## 2013-12-19 NOTE — Progress Notes (Signed)
Pt for discharge to [facility].  CSW facilitated pt discharge needs including contacting facility, faxing pt discharge information to [facility], discussing with pt wife at bedside, providing RN phone number to call report, and arranging ambulance transport for pt to [facility] (Service Request ID#: [id number]).  No further social work need identified at this time.  CSW signing off.   Alison Murray, MSW, [employer] [phone]

## 2013-12-19 NOTE — Progress Notes (Signed)
Patient was stable at time of discharge. I premedicated patient with morphine and ativan for his transfer to [facility]. Family left with all belongings.

## 2013-12-20 LAB — CULTURE, BLOOD (ROUTINE X 2)
CULTURE: NO GROWTH
Culture: NO GROWTH

## 2014-01-05 ENCOUNTER — Ambulatory Visit: Payer: 59 | Admitting: Internal Medicine

## 2014-01-16 ENCOUNTER — Telehealth: Payer: Self-pay | Admitting: Internal Medicine

## 2014-01-16 NOTE — Telephone Encounter (Signed)
Pt's wife wanted to inform office/Dr that pt passed away on 2014/01/14.

## 2014-01-16 DEATH — deceased

## 2014-04-12 IMAGING — CT CT ANGIO CHEST
1 of 2 series · 19 of 32 positions shown · IV contrast (OMNIPAQUE 300)
Comparison: DG CHEST 2 VIEW dated 11/01/2013.

CLINICAL DATA: Shortness of breath.

EXAM:
CT ANGIOGRAPHY CHEST WITH CONTRAST
TECHNIQUE: Multidetector CT imaging of the chest was performed using the
standard protocol during bolus administration of intravenous
contrast. Multiplanar CT image reconstructions and MIPs were
obtained to evaluate the vascular anatomy.
CONTRAST:  100mL OMNIPAQUE IOHEXOL 350 MG/ML SOLN

[Series 12: thins for pacs · axial · 0.74mm/px · z∈[-74,+166]mm · 19 of 268 slices shown]
[im 14/268  lung]
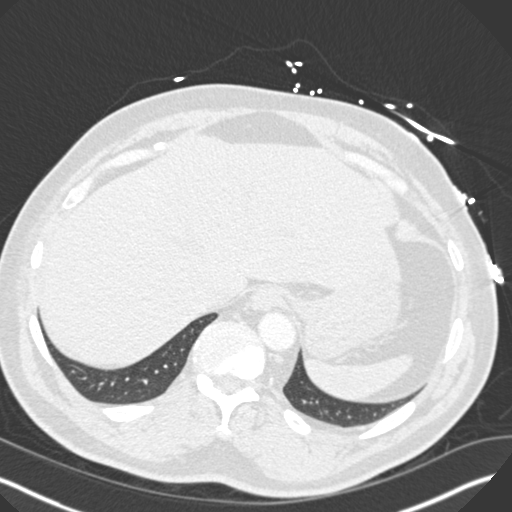
[im 27/268  mediastinal]
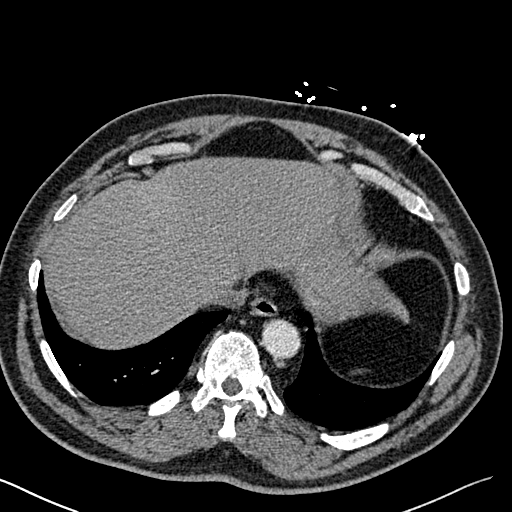
[im 41/268  lung]
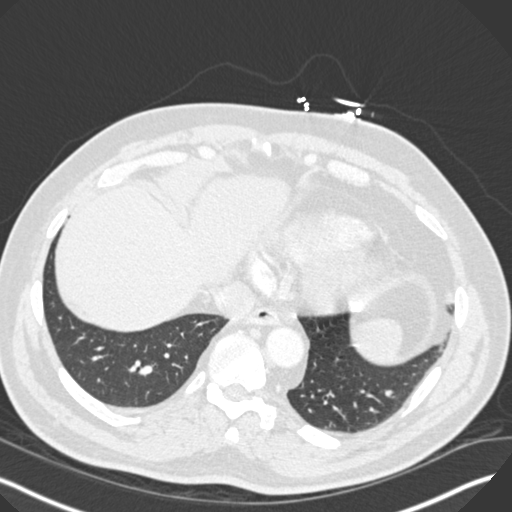
[im 67/268  mediastinal]
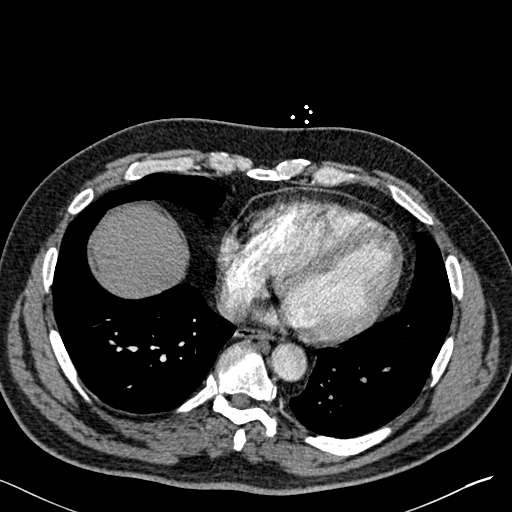
[im 81/268  lung]
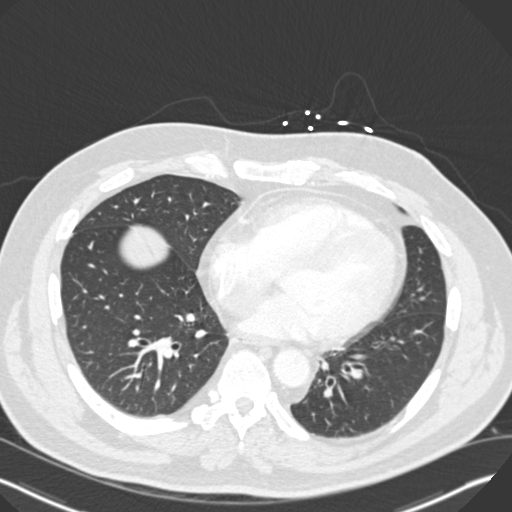
[im 90/268  mediastinal]
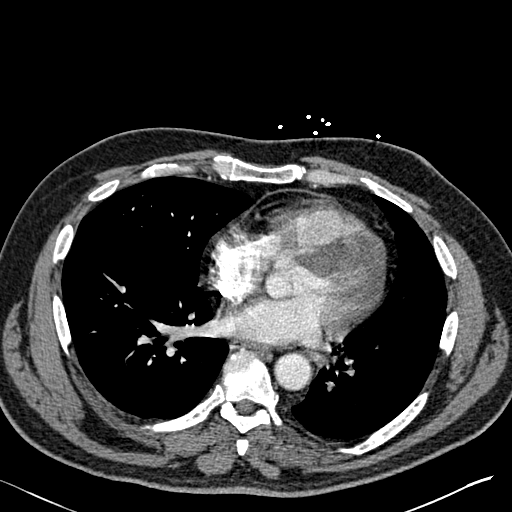
[im 94/268  lung]
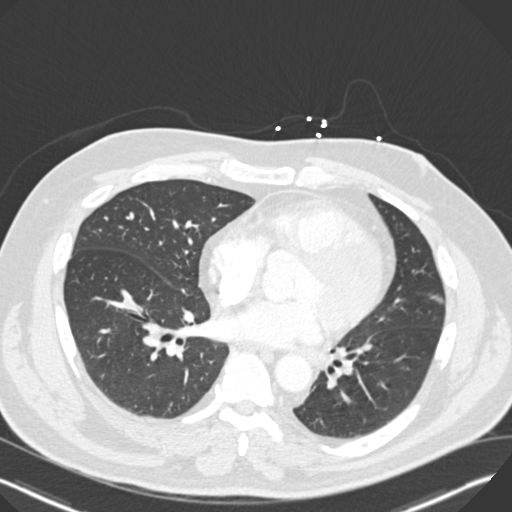
[im 107/268  mediastinal]
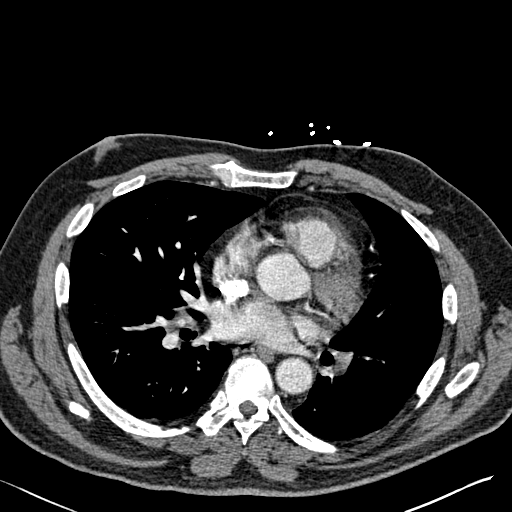
[im 121/268  lung]
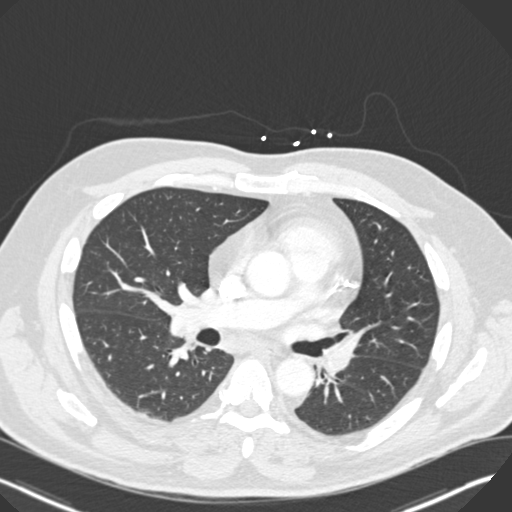
[im 134/268  mediastinal]
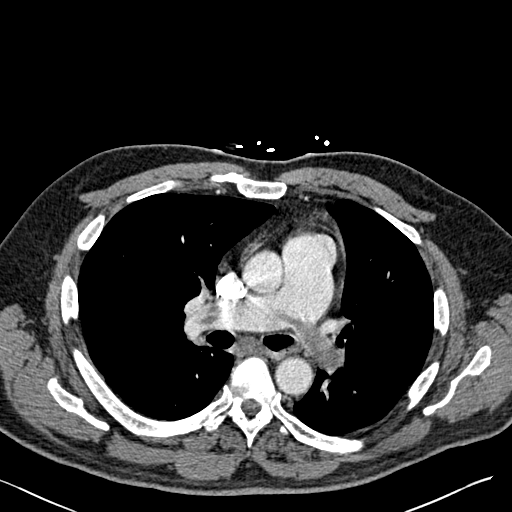
[im 147/268  lung]
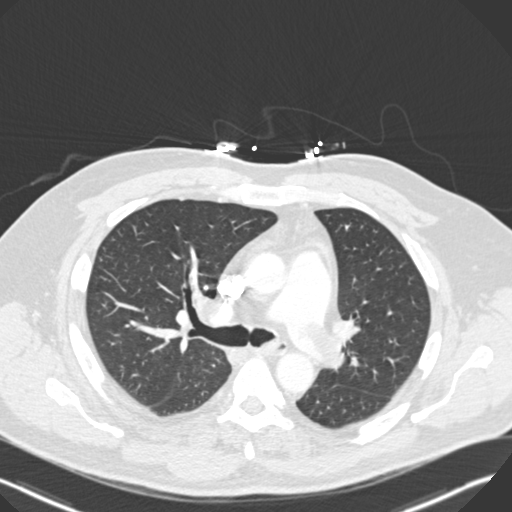
[im 161/268  mediastinal]
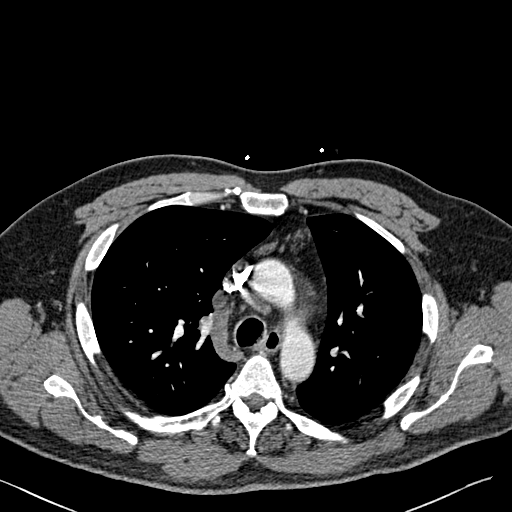
[im 174/268  lung]
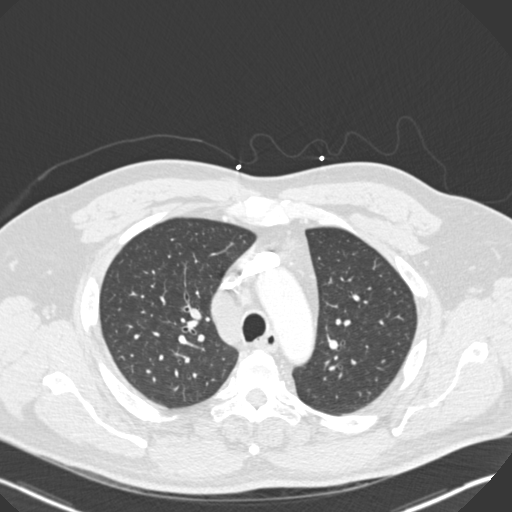
[im 179/268  mediastinal]
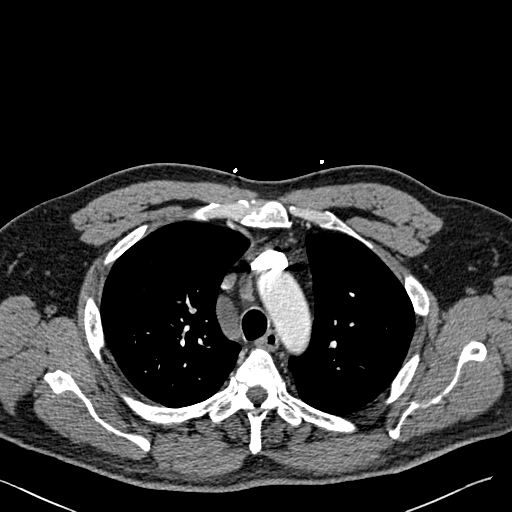
[im 187/268  lung]
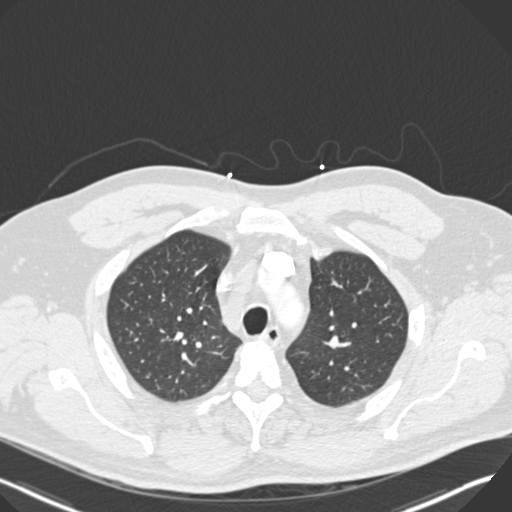
[im 201/268  mediastinal]
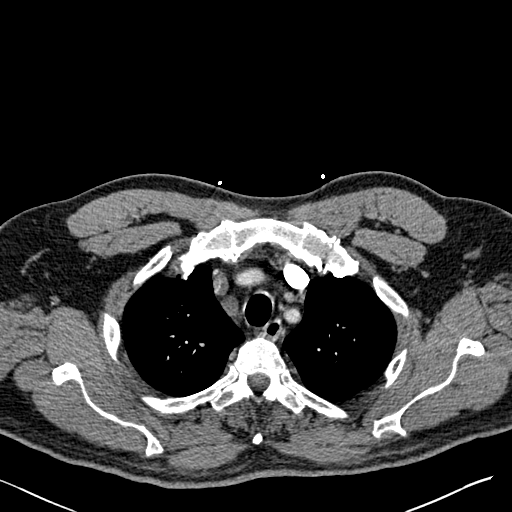
[im 227/268  lung]
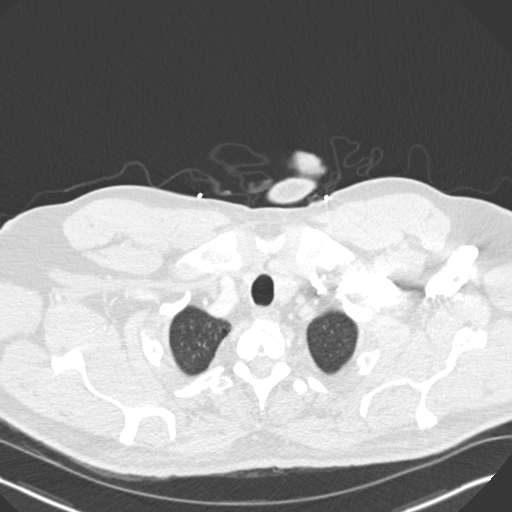
[im 241/268  mediastinal]
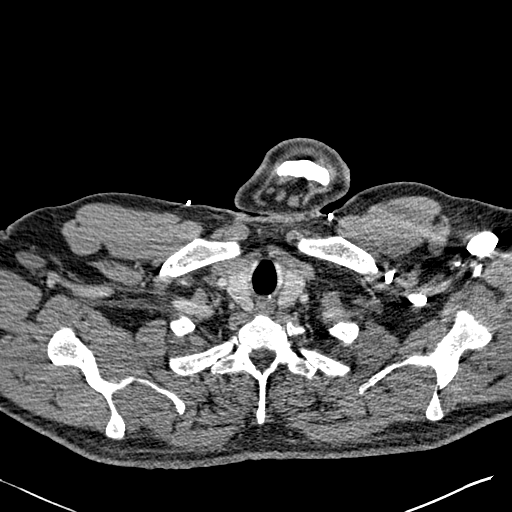
[im 254/268  lung]
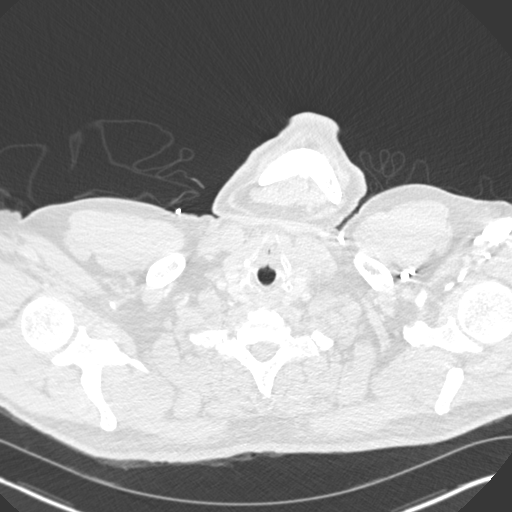

[19 of 32 positions shown; findings below may reference images not displayed]

FINDINGS: Thoracic aorta normal caliber. No evidence of dissection or
aneurysm. Massive bilateral main pulmonary artery emboli are
present. These extend into segmental branches. Right ventricular/
left ventricular ratio normal. Cardiomegaly. Coronary artery
disease. No pericardial effusion. Shotty mediastinal lymph nodes
noted. Thoracic esophagus is unremarkable.

Large airways are patent. Mild basilar atelectasis. No pleural
effusion pneumothorax.

Visualized upper abdominal viscera unremarkable.

Thyroid is unremarkable. Submental small lymph nodes are present.
Chest wall is intact. No acute bony abnormality.

Review of the MIP images confirms the above findings.
IMPRESSION: 1. Massive bilateral pulmonary emboli. Right ventricular /left
ventricular ratio normal. Critical Value/emergent results were
called by telephone at the time of interpretation on 11/01/2013 at
[DATE] to Dr. NEYERLII AMOR MIO , who verbally acknowledged these
results.

2.  Coronary artery disease.
